# Patient Record
Sex: Female | Born: 1960 | Hispanic: No | Smoking: Former smoker
Health system: Southern US, Community
[De-identification: ages and names within clinical notes are randomized; demographics above are authoritative.]

## PROBLEM LIST (undated history)

## (undated) DIAGNOSIS — M51369 Other intervertebral disc degeneration, lumbar region without mention of lumbar back pain or lower extremity pain: Secondary | ICD-10-CM

## (undated) DIAGNOSIS — M779 Enthesopathy, unspecified: Secondary | ICD-10-CM

## (undated) DIAGNOSIS — N950 Postmenopausal bleeding: Principal | ICD-10-CM

## (undated) DIAGNOSIS — N852 Hypertrophy of uterus: Secondary | ICD-10-CM

## (undated) DIAGNOSIS — E7800 Pure hypercholesterolemia, unspecified: Secondary | ICD-10-CM

## (undated) DIAGNOSIS — G8929 Other chronic pain: Secondary | ICD-10-CM

## (undated) DIAGNOSIS — J45909 Unspecified asthma, uncomplicated: Secondary | ICD-10-CM

## (undated) DIAGNOSIS — M1990 Unspecified osteoarthritis, unspecified site: Secondary | ICD-10-CM

## (undated) DIAGNOSIS — T7840XA Allergy, unspecified, initial encounter: Secondary | ICD-10-CM

## (undated) DIAGNOSIS — D649 Anemia, unspecified: Secondary | ICD-10-CM

## (undated) DIAGNOSIS — I639 Cerebral infarction, unspecified: Secondary | ICD-10-CM

## (undated) DIAGNOSIS — R569 Unspecified convulsions: Secondary | ICD-10-CM

## (undated) DIAGNOSIS — M5136 Other intervertebral disc degeneration, lumbar region: Secondary | ICD-10-CM

## (undated) DIAGNOSIS — K5792 Diverticulitis of intestine, part unspecified, without perforation or abscess without bleeding: Secondary | ICD-10-CM

## (undated) DIAGNOSIS — M719 Bursopathy, unspecified: Secondary | ICD-10-CM

## (undated) DIAGNOSIS — E782 Mixed hyperlipidemia: Secondary | ICD-10-CM

## (undated) DIAGNOSIS — N3946 Mixed incontinence: Secondary | ICD-10-CM

## (undated) DIAGNOSIS — I10 Essential (primary) hypertension: Secondary | ICD-10-CM

## (undated) DIAGNOSIS — K219 Gastro-esophageal reflux disease without esophagitis: Secondary | ICD-10-CM

## (undated) DIAGNOSIS — M549 Dorsalgia, unspecified: Secondary | ICD-10-CM

## (undated) DIAGNOSIS — I219 Acute myocardial infarction, unspecified: Secondary | ICD-10-CM

## (undated) HISTORY — DX: Other intervertebral disc degeneration, lumbar region without mention of lumbar back pain or lower extremity pain: M51.369

## (undated) HISTORY — DX: Hypertrophy of uterus: N85.2

## (undated) HISTORY — DX: Unspecified convulsions: R56.9

## (undated) HISTORY — DX: Postmenopausal bleeding: N95.0

## (undated) HISTORY — DX: Enthesopathy, unspecified: M77.9

## (undated) HISTORY — DX: Allergy, unspecified, initial encounter: T78.40XA

## (undated) HISTORY — DX: Dorsalgia, unspecified: M54.9

## (undated) HISTORY — DX: Gastro-esophageal reflux disease without esophagitis: K21.9

## (undated) HISTORY — DX: Bursopathy, unspecified: M71.9

## (undated) HISTORY — PX: OTHER SURGICAL HISTORY: SHX169

## (undated) HISTORY — DX: Essential (primary) hypertension: I10

## (undated) HISTORY — DX: Other intervertebral disc degeneration, lumbar region: M51.36

## (undated) HISTORY — PX: TONSILLECTOMY: SUR1361

## (undated) HISTORY — DX: Other chronic pain: G89.29

## (undated) HISTORY — DX: Acute myocardial infarction, unspecified: I21.9

## (undated) HISTORY — DX: Mixed incontinence: N39.46

## (undated) HISTORY — DX: Mixed hyperlipidemia: E78.2

---

## 2012-02-14 ENCOUNTER — Emergency Department (HOSPITAL_COMMUNITY): Payer: Self-pay

## 2012-02-14 ENCOUNTER — Emergency Department (HOSPITAL_COMMUNITY)
Admission: EM | Admit: 2012-02-14 | Discharge: 2012-02-14 | Disposition: A | Discharge status: Home / Self Care | Payer: Self-pay | Attending: Emergency Medicine | Admitting: Emergency Medicine

## 2012-02-14 ENCOUNTER — Encounter (HOSPITAL_COMMUNITY): Payer: Self-pay | Admitting: *Deleted

## 2012-02-14 DIAGNOSIS — E7800 Pure hypercholesterolemia, unspecified: Secondary | ICD-10-CM | POA: Insufficient documentation

## 2012-02-14 DIAGNOSIS — Z87891 Personal history of nicotine dependence: Secondary | ICD-10-CM | POA: Insufficient documentation

## 2012-02-14 DIAGNOSIS — J45909 Unspecified asthma, uncomplicated: Secondary | ICD-10-CM | POA: Insufficient documentation

## 2012-02-14 DIAGNOSIS — R109 Unspecified abdominal pain: Secondary | ICD-10-CM | POA: Insufficient documentation

## 2012-02-14 DIAGNOSIS — D649 Anemia, unspecified: Secondary | ICD-10-CM | POA: Insufficient documentation

## 2012-02-14 DIAGNOSIS — Z8739 Personal history of other diseases of the musculoskeletal system and connective tissue: Secondary | ICD-10-CM | POA: Insufficient documentation

## 2012-02-14 DIAGNOSIS — R509 Fever, unspecified: Secondary | ICD-10-CM | POA: Insufficient documentation

## 2012-02-14 DIAGNOSIS — Z862 Personal history of diseases of the blood and blood-forming organs and certain disorders involving the immune mechanism: Secondary | ICD-10-CM | POA: Insufficient documentation

## 2012-02-14 HISTORY — DX: Diverticulitis of intestine, part unspecified, without perforation or abscess without bleeding: K57.92

## 2012-02-14 HISTORY — DX: Pure hypercholesterolemia, unspecified: E78.00

## 2012-02-14 HISTORY — DX: Anemia, unspecified: D64.9

## 2012-02-14 HISTORY — DX: Unspecified osteoarthritis, unspecified site: M19.90

## 2012-02-14 HISTORY — DX: Unspecified asthma, uncomplicated: J45.909

## 2012-02-14 LAB — URINALYSIS, ROUTINE W REFLEX MICROSCOPIC
Glucose, UA: NEGATIVE mg/dL
Ketones, ur: NEGATIVE mg/dL
Leukocytes, UA: NEGATIVE
Nitrite: NEGATIVE
Protein, ur: NEGATIVE mg/dL
Urobilinogen, UA: 0.2 mg/dL (ref 0.0–1.0)

## 2012-02-14 LAB — CBC WITH DIFFERENTIAL/PLATELET
Basophils Relative: 0 % (ref 0–1)
Eosinophils Absolute: 0.1 10*3/uL (ref 0.0–0.7)
HCT: 37.4 % (ref 36.0–46.0)
Hemoglobin: 13.2 g/dL (ref 12.0–15.0)
Lymphs Abs: 2.1 10*3/uL (ref 0.7–4.0)
MCH: 30.1 pg (ref 26.0–34.0)
MCHC: 35.3 g/dL (ref 30.0–36.0)
Monocytes Absolute: 0.4 10*3/uL (ref 0.1–1.0)
Monocytes Relative: 6 % (ref 3–12)
RBC: 4.39 MIL/uL (ref 3.87–5.11)

## 2012-02-14 LAB — COMPREHENSIVE METABOLIC PANEL
Albumin: 4 g/dL (ref 3.5–5.2)
Alkaline Phosphatase: 47 U/L (ref 39–117)
BUN: 11 mg/dL (ref 6–23)
Chloride: 103 mEq/L (ref 96–112)
Creatinine, Ser: 0.64 mg/dL (ref 0.50–1.10)
GFR calc Af Amer: >90 mL/min (ref >90)
Glucose, Bld: 92 mg/dL (ref 70–99)
Potassium: 3.4 mEq/L — ABNORMAL LOW (ref 3.5–5.1)
Total Bilirubin: 0.3 mg/dL (ref 0.3–1.2)
Total Protein: 7.7 g/dL (ref 6.0–8.3)

## 2012-02-14 LAB — PREGNANCY, URINE: Preg Test, Ur: NEGATIVE

## 2012-02-14 MED ORDER — HYDROMORPHONE HCL PF 1 MG/ML IJ SOLN
1.0000 mg | Freq: Once | INTRAMUSCULAR | Status: AC
  Administered 2012-02-14: 1 mg via INTRAVENOUS
  Filled 2012-02-14: qty 1

## 2012-02-14 MED ORDER — ONDANSETRON HCL 4 MG/2ML IJ SOLN
INTRAMUSCULAR | Status: AC
  Administered 2012-02-14: 4 mg via INTRAVENOUS
  Filled 2012-02-14: qty 2

## 2012-02-14 MED ORDER — IOHEXOL 300 MG/ML  SOLN
100.0000 mL | Freq: Once | INTRAMUSCULAR | Status: AC | PRN
  Administered 2012-02-14: 100 mL via INTRAVENOUS

## 2012-02-14 MED ORDER — ONDANSETRON HCL 4 MG/2ML IJ SOLN
4.0000 mg | Freq: Once | INTRAMUSCULAR | Status: AC
  Administered 2012-02-14: 4 mg via INTRAVENOUS
  Filled 2012-02-14: qty 2

## 2012-02-14 MED ORDER — ONDANSETRON HCL 4 MG/2ML IJ SOLN
4.0000 mg | Freq: Once | INTRAMUSCULAR | Status: AC
  Administered 2012-02-14: 4 mg via INTRAVENOUS

## 2012-02-14 MED ORDER — PROMETHAZINE HCL 25 MG PO TABS: 25.0000 mg | ORAL_TABLET | Freq: Four times a day (QID) | ORAL | Status: DC | PRN

## 2012-02-14 MED ORDER — TRAMADOL HCL 50 MG PO TABS: 50.0000 mg | ORAL_TABLET | Freq: Four times a day (QID) | ORAL | Status: DC | PRN

## 2012-02-14 NOTE — ED Notes (Signed)
Family at bedside. Patient does not need anything at this time. 

## 2012-02-14 NOTE — ED Provider Notes (Signed)
History   This chart was scribed for Benny Lennert, MD by Charolett Bumpers, ED Scribe. The patient was seen in room [facility]. Patient's care was started at 1433.   CSN: [id number]  Arrival date & time 02/14/12  1050   First MD Initiated Contact with Patient 02/14/12 1433      Chief Complaint  Patient presents with  . Diverticulitis   Lisa [name] is a 51 y.o. female who presents to the Emergency Department complaining of RLQ abdominal pain with associated fever and chills that started 3 days ago. She states that she has a h/o diverticulitis and today's pain feels similar. She states that her last flare up was in October. She states the only abdominal surgery she has had was a c-section. She states that her LNMP was last month.   Patient is a 51 y.o. female presenting with abdominal pain. The history is provided by the patient. No language interpreter was used.  Abdominal Pain The primary symptoms of the illness include abdominal pain and fever. The primary symptoms of the illness do not include fatigue, nausea, vomiting or diarrhea. The current episode started more than 2 days ago. The onset of the illness was gradual. The problem has been gradually worsening.  The abdominal pain is located in the RLQ and suprapubic region. The abdominal pain does not radiate.  Additional symptoms associated with the illness include chills. Symptoms associated with the illness do not include hematuria, frequency or back pain.    Past Medical History  Diagnosis Date  . Diverticulitis   . Arthritis   . Asthma   . High cholesterol   . Hypoglycemia   . Anemia     Past Surgical History  Procedure Date  . Cesarean section   . Tonsillectomy     No family history on file.  History  Substance Use Topics  . Smoking status: Former Games developer  . Smokeless tobacco: Not on file  . Alcohol Use: No    OB History    Grav Para Term Preterm Abortions TAB SAB Ect Mult Living                   Review of Systems  Constitutional: Positive for fever and chills. Negative for fatigue.  HENT: Negative for congestion, sinus pressure and ear discharge.   Eyes: Negative for discharge.  Respiratory: Negative for cough.   Cardiovascular: Negative for chest pain.  Gastrointestinal: Positive for abdominal pain. Negative for nausea, vomiting and diarrhea.  Genitourinary: Negative for frequency and hematuria.  Musculoskeletal: Negative for back pain.  Skin: Negative for rash.  Neurological: Negative for seizures and headaches.  Hematological: Negative.   Psychiatric/Behavioral: Negative for hallucinations.  All other systems reviewed and are negative.    Allergies  Naproxen and Penicillins  Home Medications  No current outpatient prescriptions on file.  BP 137/102  Pulse 60  Temp 97.7 F (36.5 C) (Oral)  Resp 20  Ht 5\' 3"  (1.6 m)  Wt 190 lb 2 oz (86.24 kg)  BMI 33.68 kg/m2  SpO2 96%  LMP 12/26/2011  Physical Exam  Nursing note and vitals reviewed. Constitutional: She is oriented to person, place, and time. She appears well-developed.  HENT:  Head: Normocephalic and atraumatic.  Eyes: Conjunctivae normal and EOM are normal. No scleral icterus.  Neck: Neck supple. No thyromegaly present.  Cardiovascular: Normal rate, regular rhythm and normal heart sounds.  Exam reveals no gallop and no friction rub.   No murmur heard. Pulmonary/Chest: Effort normal  and breath sounds normal. No stridor. No respiratory distress. She has no wheezes. She has no rales. She exhibits no tenderness.  Abdominal: Soft. Bowel sounds are normal. She exhibits no distension. There is tenderness. There is no rebound and no guarding.       Moderate RLQ abdominal tenderness with some over suprapubic region.   Musculoskeletal: Normal range of motion. She exhibits no edema.  Lymphadenopathy:    She has no cervical adenopathy.  Neurological: She is oriented to person, place, and time. Coordination  normal.  Skin: No rash noted. No erythema.  Psychiatric: She has a normal mood and affect. Her behavior is normal.    ED Course  Procedures (including critical care time)  DIAGNOSTIC STUDIES: Oxygen Saturation is 96% on room air, adequate by my interpretation.    COORDINATION OF CARE:  14:40-Discussed planned course of treatment with the patient including pain and nausea medication, blood work and UA, who is agreeable at this time.   14:45-Medication Orders: Hydromorphone (Dilaudid) injection 1 mg-once; Ondansetron (Zofran) injection 4 mg-once.   Labs Reviewed - No data to display No results found.   No diagnosis found.    MDM  The chart was scribed for me under my direct supervision.  I personally performed the history, physical, and medical decision making and all procedures in the evaluation of this patient.Benny Lennert, MD 02/14/12 [phone]

## 2012-02-14 NOTE — ED Notes (Signed)
Hx of diverticulitis. C/o lower abd pain.  Denies n/v/d.

## 2012-07-24 ENCOUNTER — Other Ambulatory Visit (HOSPITAL_COMMUNITY): Payer: Self-pay | Admitting: Physician Assistant

## 2012-07-24 DIAGNOSIS — Z139 Encounter for screening, unspecified: Secondary | ICD-10-CM

## 2012-08-01 ENCOUNTER — Ambulatory Visit (HOSPITAL_COMMUNITY): Payer: Self-pay

## 2012-08-16 ENCOUNTER — Ambulatory Visit (HOSPITAL_COMMUNITY): Payer: Self-pay

## 2012-09-16 ENCOUNTER — Ambulatory Visit (HOSPITAL_COMMUNITY)
Admission: RE | Admit: 2012-09-16 | Discharge: 2012-09-16 | Disposition: A | Discharge status: Home / Self Care | Payer: Self-pay | Source: Ambulatory Visit | Attending: Physician Assistant | Admitting: Physician Assistant

## 2012-09-16 DIAGNOSIS — Z139 Encounter for screening, unspecified: Secondary | ICD-10-CM

## 2012-12-10 ENCOUNTER — Ambulatory Visit (INDEPENDENT_AMBULATORY_CARE_PROVIDER_SITE_OTHER): Payer: Self-pay | Admitting: Cardiology

## 2012-12-10 ENCOUNTER — Emergency Department (HOSPITAL_COMMUNITY): Payer: Self-pay

## 2012-12-10 ENCOUNTER — Encounter: Payer: Self-pay | Admitting: Cardiology

## 2012-12-10 ENCOUNTER — Encounter: Payer: Self-pay | Admitting: *Deleted

## 2012-12-10 ENCOUNTER — Encounter (HOSPITAL_COMMUNITY): Payer: Self-pay | Admitting: *Deleted

## 2012-12-10 ENCOUNTER — Ambulatory Visit: Payer: Self-pay | Admitting: Cardiology

## 2012-12-10 ENCOUNTER — Emergency Department (HOSPITAL_COMMUNITY)
Admission: EM | Admit: 2012-12-10 | Discharge: 2012-12-10 | Disposition: A | Discharge status: Home / Self Care | Payer: Self-pay | Attending: Emergency Medicine | Admitting: Emergency Medicine

## 2012-12-10 VITALS — BP 152/83 | HR 63 | Ht 62.0 in | Wt 189.1 lb

## 2012-12-10 DIAGNOSIS — I10 Essential (primary) hypertension: Secondary | ICD-10-CM

## 2012-12-10 DIAGNOSIS — M25521 Pain in right elbow: Secondary | ICD-10-CM

## 2012-12-10 DIAGNOSIS — Y9301 Activity, walking, marching and hiking: Secondary | ICD-10-CM | POA: Insufficient documentation

## 2012-12-10 DIAGNOSIS — G8929 Other chronic pain: Secondary | ICD-10-CM | POA: Insufficient documentation

## 2012-12-10 DIAGNOSIS — S59909A Unspecified injury of unspecified elbow, initial encounter: Secondary | ICD-10-CM | POA: Insufficient documentation

## 2012-12-10 DIAGNOSIS — Z8719 Personal history of other diseases of the digestive system: Secondary | ICD-10-CM | POA: Insufficient documentation

## 2012-12-10 DIAGNOSIS — E782 Mixed hyperlipidemia: Secondary | ICD-10-CM

## 2012-12-10 DIAGNOSIS — R072 Precordial pain: Secondary | ICD-10-CM

## 2012-12-10 DIAGNOSIS — I252 Old myocardial infarction: Secondary | ICD-10-CM | POA: Insufficient documentation

## 2012-12-10 DIAGNOSIS — S8391XA Sprain of unspecified site of right knee, initial encounter: Secondary | ICD-10-CM

## 2012-12-10 DIAGNOSIS — Y9269 Other specified industrial and construction area as the place of occurrence of the external cause: Secondary | ICD-10-CM | POA: Insufficient documentation

## 2012-12-10 DIAGNOSIS — M129 Arthropathy, unspecified: Secondary | ICD-10-CM | POA: Insufficient documentation

## 2012-12-10 DIAGNOSIS — Z87891 Personal history of nicotine dependence: Secondary | ICD-10-CM | POA: Insufficient documentation

## 2012-12-10 DIAGNOSIS — J45909 Unspecified asthma, uncomplicated: Secondary | ICD-10-CM | POA: Insufficient documentation

## 2012-12-10 DIAGNOSIS — R296 Repeated falls: Secondary | ICD-10-CM | POA: Insufficient documentation

## 2012-12-10 DIAGNOSIS — W19XXXA Unspecified fall, initial encounter: Secondary | ICD-10-CM

## 2012-12-10 DIAGNOSIS — Z79899 Other long term (current) drug therapy: Secondary | ICD-10-CM | POA: Insufficient documentation

## 2012-12-10 DIAGNOSIS — IMO0002 Reserved for concepts with insufficient information to code with codable children: Secondary | ICD-10-CM | POA: Insufficient documentation

## 2012-12-10 DIAGNOSIS — Z7982 Long term (current) use of aspirin: Secondary | ICD-10-CM | POA: Insufficient documentation

## 2012-12-10 DIAGNOSIS — S6990XA Unspecified injury of unspecified wrist, hand and finger(s), initial encounter: Secondary | ICD-10-CM | POA: Insufficient documentation

## 2012-12-10 NOTE — Assessment & Plan Note (Signed)
Patient states that she has recently been placed on HCTZ. [facility]. May need additional adjustments.

## 2012-12-10 NOTE — Assessment & Plan Note (Signed)
Symptoms as outlined above, possibly anginal based on description. Patient reports a vague history of heart attack back in the 1980s, details are not clear, she also denies any specific ischemic testing over the years. ECG reviewed and overall nonspecific with questionable inferior Q waves. She is not certain about her family history. Does have a prior history of tobacco use, known hypertension and hyperlipidemia. Reports being on a stable medical regimen recently. We discussed options for further objective cardiac evaluation including both noninvasive and invasive techniques. Given her functional limitations related to chronic pain and difficulty ambulating, we will obtain a dobutamine Myoview. Echocardiogram also be obtained to assess cardiac structure and function. We will inform her of the results and determine if any further evaluation is needed at this time.

## 2012-12-10 NOTE — Assessment & Plan Note (Signed)
Patient now on Zocor, LDL is under 100.

## 2012-12-10 NOTE — ED Provider Notes (Signed)
CSN: [id number]     Arrival date & time 12/10/12  1100 History   This chart was scribed for Lisa Gaskins, MD, by Yevette Edwards, ED Scribe. This patient was seen in room [facility] and the patient's care was started at 1:49 PM. First MD Initiated Contact with Patient 12/10/12 1344     Chief Complaint  Patient presents with  . Knee Pain  . Arm Pain  . Fall    Patient is a 52 y.o. female presenting with knee pain, arm pain, and fall. The history is provided by the patient. No language interpreter was used.  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    Entrapped after fall: no   Knee location:  R knee Pain details:    Radiates to:  Does not radiate Arm Pain Pertinent negatives include no abdominal pain.  Fall Pertinent negatives include no abdominal pain.   HPI Comments: Lisa [name] is a 52 y.o. female, with a h/o arthritis, who presents to the Emergency Department complaining of a fall which occurred outside her cardiologist's office this afternoon while using her cellphone; she landed upon her right knee and right elbow in the fall. She denies any head trauma or LOC. The pt reports pain to her right arm and her right leg; she rates the pain as 5/10. She denies any chest or abdominal pain. The pt has a h/o chronic back pain and tendinitis. She takes low-dosage aspirin.  .   Past Medical History  Diagnosis Date  . Diverticulitis   . Arthritis   . Asthma   . Mixed hyperlipidemia   . Essential hypertension, benign   . Tendinitis     Patient reports chronic problem involving her arms and hands  . Chronic back pain     Patient reports lower back disc problems  . Myocardial infarction     Patient reports related to "steroids" in 1985 [facility])   Past Surgical History  Procedure Laterality Date  . Cesarean section    . Tonsillectomy     No  family history on file. History  Substance Use Topics  . Smoking status: Former Smoker    Types: Cigarettes  . Smokeless tobacco: Not on file  . Alcohol Use: No   No OB history provided.  Review of Systems  Gastrointestinal: Negative for abdominal pain.  Musculoskeletal: Positive for myalgias and arthralgias.  Neurological: Negative for syncope.  All other systems reviewed and are negative.    Allergies  Morphine and related; Naproxen; Ibuprofen; and Penicillins  Home Medications   Current Outpatient Rx  Name  Route  Sig  Dispense  Refill  . acetaminophen (TYLENOL) 500 MG tablet   Oral   Take 1,000 mg by mouth every 6 (six) hours as needed. Pain         . albuterol (PROVENTIL HFA;VENTOLIN HFA) 108 (90 BASE) MCG/ACT inhaler   Inhalation   Inhale 2 puffs into the lungs every 6 (six) hours as needed for wheezing.         Marland Kitchen aspirin EC 81 MG tablet   Oral   Take 81 mg by mouth daily.         . hydrochlorothiazide (HYDRODIURIL) 25 MG tablet   Oral   Take 25 mg by mouth daily.         Marland Kitchen  magnesium oxide (MAG-OX) 400 MG tablet   Oral   Take 400 mg by mouth daily.         . Melatonin-Pyridoxine (MELATIN PO)   Oral   Take 3 mg by mouth daily.          . promethazine (PHENERGAN) 25 MG tablet   Oral   Take 1 tablet (25 mg total) by mouth every 6 (six) hours as needed for nausea.   15 tablet   0   . simvastatin (ZOCOR) 20 MG tablet   Oral   Take 20 mg by mouth at bedtime.         Marland Kitchen VITAMIN E PO   Oral   Take 1 tablet by mouth daily.         . Cholecalciferol (VITAMIN D PO)   Oral   Take 2,000 Units by mouth daily.          . fish oil-omega-3 fatty acids 1000 MG capsule   Oral   Take 2 g by mouth daily.         . traMADol (ULTRAM) 50 MG tablet   Oral   Take 25 mg by mouth every 6 (six) hours as needed for pain.          Triage Vitals: BP 149/76  Pulse 60  Temp(Src) 97.7 F (36.5 C) (Oral)  Resp 18  Ht 5\' 2"  (1.575 m)  Wt 189 lb  (85.73 kg)  BMI 34.56 kg/m2  SpO2 98%  LMP 12/26/2011  . Physical Exam  Nursing note and vitals reviewed. Constitutional: She is oriented to person, place, and time. She appears well-developed and well-nourished. No distress.  HENT:  Head: Normocephalic and atraumatic.  Eyes: EOM are normal.  Neck: Neck supple. No tracheal deviation present.  Cardiovascular: Normal rate.   Pulmonary/Chest: Effort normal. No respiratory distress.  Musculoskeletal: Normal range of motion.  Mild tenderness to right elbow. Mild tenderness to right patella. Full ROM for both hips without difficulty. No other bony tenderness or deformity noted.   No CTL tenderness.   Neurological: She is alert and oriented to person, place, and time.  Pt ambulatory without difficulty.   Skin: Skin is warm and dry.  Psychiatric: She has a normal mood and affect. Her behavior is normal.    ED Course  Procedures (including critical care time)  DIAGNOSTIC STUDIES: Oxygen Saturation is 96% on room air, normal by my interpretation.    COORDINATION OF CARE:  1:54 PM- Discussed treatment plan with patient, and the patient agreed to the plan.   Labs Review Labs Reviewed - No data to display Imaging Review Dg Elbow Complete Right  12/10/2012   CLINICAL DATA:  Pain post trauma  EXAM: RIGHT ELBOW - COMPLETE 3+ VIEW  COMPARISON:  None.  FINDINGS: Frontal, lateral, and bilateral oblique views were obtained. There is no fracture, dislocation, or effusion. Joint spaces appear intact. No erosive change.  IMPRESSION: No abnormality noted.   Electronically Signed   By: Bretta Bang   On: 12/10/2012 13:07   Dg Knee Complete 4 Views Right  12/10/2012   CLINICAL DATA:  Pain post trauma  EXAM: RIGHT KNEE - COMPLETE 4+ VIEW  COMPARISON:  None.  FINDINGS: Frontal, lateral, and bilateral oblique views were obtained. There is no fracture, dislocation, or effusion. Joint spaces appear intact. No erosive change.  IMPRESSION: No  abnormality noted.   Electronically Signed   By: Bretta Bang   On: 12/10/2012 13:07    Pt  ambulatory without difficulty, and she is requesting d/c home.  She does not want pain meds   MDM  No diagnosis found.   Nursing notes including past medical history and social history reviewed and considered in documentation xrays reviewed and considered    I personally performed the services described in this documentation, which was scribed in my presence. The recorded information has been reviewed and is accurate.       Lisa Gaskins, MD 12/10/12 2218

## 2012-12-10 NOTE — ED Notes (Signed)
Pt presents with RT arm and knee pain after a fall sustained a few minutes ago. Pulses present in all extremities.

## 2012-12-10 NOTE — ED Notes (Signed)
Pt returned from radiology, states " I think I dislocated my back, my knee and arm feels sore but better".  Bilateral pedal pulses present and strong. Denies bowel and bladder dysfunction. Pt ambulated from wheelchair to stretcher slow but no difficulty noted. Gait steady.

## 2012-12-10 NOTE — Progress Notes (Signed)
Clinical Summary Lisa [name] is a 52 y.o.female referred for cardiology consultation by Ms. McElroy PA-C at the [facility]. The records regarding her prior health history are not available at this time, and she is not certain of her family history. She has been living in [place] since last October, apparently prior to this was in [place] and [place]. She states that she had a "heart attack" back in 1985 after being treated with "steroids." She denies having any specific ischemic testing over the years, not certain about her followup for this reported problem. 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  She comes in today with her significant other, reporting a six-month history of intermittent chest "tightness" typically when she is up on her feet for "too long" which is usually about 20 or 30 minutes. She reports a history of similar chest pain for years, typically in the setting of emotional stress. At baseline she has NYHA class II dyspnea. She states that she is functionally limited due to chronic back pain, uses a walker when she has to go for any distance, also is on chronic pain medications.  Recent lab work in August showed potassium 4.9, BUN 11, creatinine 0.7, AST 44, ALT 31, cholesterol 160, triglycerides 101, HDL 46, LDL 94. Faxed copy of ECGs showed questionable inferior Q waves.  Today we discussed her known cardiac risk factor profile, her symptomatology, and possibility for initial cardiovascular testing. We discussed noninvasive and invasive techniques. She was very concerned about her possibility of being able to ambulate on a treadmill for any distance and we therefore discussed pharmacologic stress testing as well.   Allergies  Allergen Reactions  . Morphine And Related Other (See Comments)    welps  . Naproxen Other (See Comments)   Lightheadedness  . Ibuprofen Rash  . Penicillins Rash    Current Outpatient Prescriptions  Medication Sig Dispense Refill  . acetaminophen (TYLENOL) 500 MG tablet Take 1,000 mg by mouth every 6 (six) hours as needed. Pain      . albuterol (PROVENTIL HFA;VENTOLIN HFA) 108 (90 BASE) MCG/ACT inhaler Inhale 2 puffs into the lungs every 6 (six) hours as needed for wheezing.      Marland Kitchen aspirin 81 MG tablet Take 81 mg by mouth daily.      . Cholecalciferol (VITAMIN D PO) Take 2,000 Units by mouth daily.       . fish oil-omega-3 fatty acids 1000 MG capsule Take 2 g by mouth daily.      . hydrochlorothiazide (HYDRODIURIL) 25 MG tablet Take 25 mg by mouth daily.      . Melatonin-Pyridoxine (MELATIN PO) Take 3 mg by mouth daily.       . promethazine (PHENERGAN) 25 MG tablet Take 1 tablet (25 mg total) by mouth every 6 (six) hours as needed for nausea.  15 tablet  0  . simvastatin (ZOCOR) 20 MG tablet Take 20 mg by mouth at bedtime.      . traMADol (ULTRAM) 50 MG tablet Take 25 mg by mouth every 6 (six) hours as needed for pain.      Marland Kitchen VITAMIN E PO Take 1 tablet by mouth daily.      . magnesium oxide (MAG-OX) 400 MG tablet Take 400 mg by mouth daily.       No current facility-administered medications for this visit.  Past Medical History  Diagnosis Date  . Diverticulitis   . Arthritis   . Asthma   . Mixed hyperlipidemia   . Essential hypertension, benign   . Tendinitis     Patient reports chronic problem involving her arms and hands  . Chronic back pain     Patient reports lower back disc problems  . Myocardial infarction     Patient reports related to "steroids" in 1985 [facility])    Past Surgical History  Procedure Laterality Date  . Cesarean section    . Tonsillectomy      Family History  Problem Relation Age of Onset  . Family history unknown: Yes    Social History Ms. Rothe reports that she has quit smoking. Her smoking use included Cigarettes. She smoked 0.00 packs per day. She  does not have any smokeless tobacco history on file. Ms. Kafer reports that she does not drink alcohol.  Review of Systems No palpitations or syncope. Reports chronic problems due to arm tendinitis, lower back pain. Stable appetite. Otherwise as outlined above.  Physical Examination Filed Vitals:   12/10/12 0936  BP: 152/83  Pulse: 63   Filed Weights   12/10/12 0936  Weight: 189 lb 1.9 oz (85.784 kg)   Appears somewhat older than stated age. No acute distress. HEENT: Conjunctiva and lids normal, oropharynx clear. Neck: Supple, no elevated JVP or carotid bruits, no thyromegaly. Lungs: Clear to auscultation, nonlabored breathing at rest. Cardiac: Regular rate and rhythm, no S3 or significant systolic murmur, no pericardial rub. Abdomen: Soft, nontender, bowel sounds present, no guarding or rebound. Extremities: Trace ankle edema, distal pulses 1-2+. Skin: Warm and dry. Musculoskeletal: No kyphosis. Neuropsychiatric: Alert and oriented x3, somewhat tangential historian.   Problem List and Plan   Precordial pain Symptoms as outlined above, possibly anginal based on description. Patient reports a vague history of heart attack back in the 1980s, details are not clear, she also denies any specific ischemic testing over the years. ECG reviewed and overall nonspecific with questionable inferior Q waves. She is not certain about her family history. Does have a prior history of tobacco use, known hypertension and hyperlipidemia. Reports being on a stable medical regimen recently. We discussed options for further objective cardiac evaluation including both noninvasive and invasive techniques. Given her functional limitations related to chronic pain and difficulty ambulating, we will obtain a dobutamine Myoview. Echocardiogram also be obtained to assess cardiac structure and function. We will inform her of the results and determine if any further evaluation is needed at this time.  Essential  hypertension, benign Patient states that she has recently been placed on HCTZ. [facility]. May need additional adjustments.  Mixed hyperlipidemia Patient now on Zocor, LDL is under 100.    Jonelle Sidle, M.D., F.A.C.C.

## 2012-12-10 NOTE — Patient Instructions (Addendum)
Your physician recommends that you schedule a follow-up appointment in: TO BE DETERMINED  Your physician has requested that you have an echocardiogram. Echocardiography is a painless test that uses sound waves to create images of your heart. It provides your doctor with information about the size and shape of your heart and how well your heart's chambers and valves are working. This procedure takes approximately one hour. There are no restrictions for this procedure.  Your physician has requested that you have a dobutamine myoview. For furth information please visit [email or web address] Please follow instruction sheet, as given.  WE WILL CALL YOU WITH YOUR TEST RESULTS/INSTRUCTIONS/NEXT STEPS ONCE RECEIVED BY THE PROVIDER

## 2012-12-17 ENCOUNTER — Encounter: Payer: Self-pay | Admitting: Cardiology

## 2012-12-20 ENCOUNTER — Encounter (HOSPITAL_COMMUNITY): Payer: Self-pay

## 2012-12-20 ENCOUNTER — Ambulatory Visit (HOSPITAL_COMMUNITY)
Admission: RE | Admit: 2012-12-20 | Discharge: 2012-12-20 | Disposition: A | Discharge status: Home / Self Care | Payer: Self-pay | Source: Ambulatory Visit | Attending: Cardiology | Admitting: Cardiology

## 2012-12-20 ENCOUNTER — Encounter (HOSPITAL_COMMUNITY)
Admission: RE | Admit: 2012-12-20 | Discharge: 2012-12-20 | Disposition: A | Discharge status: Home / Self Care | Payer: Self-pay | Source: Ambulatory Visit | Attending: Cardiology | Admitting: Cardiology

## 2012-12-20 DIAGNOSIS — R079 Chest pain, unspecified: Secondary | ICD-10-CM

## 2012-12-20 DIAGNOSIS — I517 Cardiomegaly: Secondary | ICD-10-CM

## 2012-12-20 DIAGNOSIS — I10 Essential (primary) hypertension: Secondary | ICD-10-CM | POA: Insufficient documentation

## 2012-12-20 DIAGNOSIS — R072 Precordial pain: Secondary | ICD-10-CM

## 2012-12-20 DIAGNOSIS — Z87891 Personal history of nicotine dependence: Secondary | ICD-10-CM | POA: Insufficient documentation

## 2012-12-20 MED ORDER — DOBUTAMINE IN D5W 4-5 MG/ML-% IV SOLN
INTRAVENOUS | Status: AC
  Administered 2012-12-20: 10 ug/kg/min via INTRAVENOUS
  Filled 2012-12-20: qty 250

## 2012-12-20 MED ORDER — ATROPINE SULFATE 1 MG/ML IJ SOLN
INTRAMUSCULAR | Status: AC
  Administered 2012-12-20: 0.5 mg via INTRAVENOUS
  Filled 2012-12-20: qty 1

## 2012-12-20 MED ORDER — TECHNETIUM TC 99M SESTAMIBI - CARDIOLITE
30.0000 | Freq: Once | INTRAVENOUS | Status: AC | PRN
  Administered 2012-12-20: 12:00:00 30 via INTRAVENOUS

## 2012-12-20 MED ORDER — TECHNETIUM TC 99M SESTAMIBI - CARDIOLITE
10.0000 | Freq: Once | INTRAVENOUS | Status: AC | PRN
  Administered 2012-12-20: 10 via INTRAVENOUS

## 2012-12-20 MED ORDER — NITROGLYCERIN 0.4 MG SL SUBL
SUBLINGUAL_TABLET | SUBLINGUAL | Status: AC
  Administered 2012-12-20: 0.4 mg via SUBLINGUAL
  Filled 2012-12-20: qty 25

## 2012-12-20 NOTE — Progress Notes (Signed)
Stress Lab Nurses Notes - Lisa [name] 12/20/2012 Reason for doing test: CAD and Chest Pain Type of test: Dobutamine Cardiolite  Nurse performing test: Parke Poisson, RN Nuclear Medicine Tech: Lyndel Pleasure Echo Tech: Not Applicable MD performing test: Dr. Wyline Mood / Joni Reining NP Family MD: [facility] Test explained and consent signed: yes IV started: 22g jelco, NS 250 cc, KVO, No redness or edema and Saline lock started in radiology Symptoms: Chest pain ,SOB & Vomiting Treatment/Intervention: Atropine 0.5 mg IV and NTG 0.4SL x 1 Reason test stopped: reached target HR After recovery IV was: Discontinued via X-ray tech and No redness or edema Patient to return to Nuc. Med at : 12:45 Patient discharged: Home Patient's Condition upon discharge was: stable Comments: During test peak BP 166/81 & HR 160.  Recovery BP 131/87 & HR 82.  Symptoms resolved in recovery. [name] T

## 2012-12-20 NOTE — Progress Notes (Signed)
[facility]   2D echo completed 12/20/2012.   Cindy Staysha Truby, RDCS  

## 2012-12-23 ENCOUNTER — Other Ambulatory Visit (HOSPITAL_COMMUNITY): Payer: Self-pay

## 2012-12-23 DIAGNOSIS — R0602 Shortness of breath: Secondary | ICD-10-CM

## 2012-12-26 ENCOUNTER — Encounter (HOSPITAL_COMMUNITY): Payer: Self-pay

## 2013-01-13 ENCOUNTER — Ambulatory Visit (HOSPITAL_COMMUNITY)
Admission: RE | Admit: 2013-01-13 | Discharge: 2013-01-13 | Disposition: A | Discharge status: Home / Self Care | Payer: Self-pay | Source: Ambulatory Visit | Attending: Physician Assistant | Admitting: Physician Assistant

## 2013-01-13 DIAGNOSIS — R0602 Shortness of breath: Secondary | ICD-10-CM | POA: Insufficient documentation

## 2013-01-13 MED ORDER — ALBUTEROL SULFATE (5 MG/ML) 0.5% IN NEBU
2.5000 mg | INHALATION_SOLUTION | Freq: Once | RESPIRATORY_TRACT | Status: AC
  Administered 2013-01-13: 2.5 mg via RESPIRATORY_TRACT

## 2013-01-15 NOTE — Procedures (Signed)
[name], [name]                ACCOUNT NO.:  [id number]  MEDICAL RECORD NO.:  [id number]  LOCATION:  RESP                          FACILITY:  [facility]  PHYSICIAN:  Yarenis Cerino L. Juanetta Gosling, M.D.DATE OF BIRTH:  Dec 23, 1960  DATE OF PROCEDURE: DATE OF DISCHARGE:  01/13/2013                           PULMONARY FUNCTION TEST   REASON FOR PULMONARY FUNCTION TESTING:  Shortness of breath.  1. Spirometry shows a mild ventilatory defect without evidence of     airflow obstruction. 2. Lung volumes shows significant reduction in total lung capacity     suggesting a primary restrictive lung disease.  Clinical     correlation is suggested. 3. DLCO is mildly reduced. 4. Airway resistance is normal. 5. There was no significant bronchodilator improvement. 6. This patient may have a primary restrictive lung disease and should     be evaluated for that.     Ada Holness L. Juanetta Gosling, M.D.     ELH/MEDQ  D:  01/14/2013  T:  01/15/2013  Job:  [id number]

## 2013-01-21 ENCOUNTER — Ambulatory Visit (INDEPENDENT_AMBULATORY_CARE_PROVIDER_SITE_OTHER): Payer: Self-pay | Admitting: Cardiology

## 2013-01-21 ENCOUNTER — Encounter: Payer: Self-pay | Admitting: Cardiology

## 2013-01-21 ENCOUNTER — Ambulatory Visit: Payer: Self-pay | Admitting: Cardiology

## 2013-01-21 VITALS — BP 164/92 | HR 63 | Ht 62.0 in | Wt 186.8 lb

## 2013-01-21 DIAGNOSIS — E782 Mixed hyperlipidemia: Secondary | ICD-10-CM

## 2013-01-21 DIAGNOSIS — I10 Essential (primary) hypertension: Secondary | ICD-10-CM

## 2013-01-21 DIAGNOSIS — R9430 Abnormal result of cardiovascular function study, unspecified: Secondary | ICD-10-CM

## 2013-01-21 LAB — PULMONARY FUNCTION TEST

## 2013-01-21 NOTE — Progress Notes (Signed)
Clinical Summary Ms. Tanksley is a 52 y.o.female presenting for office followup, last seen in September. She is here with her significant other. She reports no escalation in shortness of breath or intermittent chest pain symptoms.  Dobutamine Cardiolite in September reported no diagnostic ST segment changes, small area of ischemia in the RCA distribution, LVEF 71%, overall low risk. Echocardiogram at that time demonstrated mild LVH with LVEF 65-70%, grade 1 diastolic dysfunction, trivial tricuspid regurgitation, unable to calculate PASP, CVP 8 mm mercury, no pericardial effusion.  Today we reviewed the results of her stress testing, discussed the implications. This test would suggest some degree of residual atherosclerosis in the RCA distribution, she reports a history of prior myocardial infarction, although details are not available. Overall low risk study in general. We discussed strategy of medical therapy, risk factor modification, and observation, realizing that we could pursue further invasive evaluation if symptoms progressed. She voiced comfort with this.   Allergies  Allergen Reactions  . Morphine And Related Other (See Comments)    welps  . Naproxen Other (See Comments)    Lightheadedness  . Ibuprofen Rash  . Penicillins Rash    Current Outpatient Prescriptions  Medication Sig Dispense Refill  . acetaminophen (TYLENOL) 500 MG tablet Take 1,000 mg by mouth every 6 (six) hours as needed. Pain      . albuterol (PROVENTIL HFA;VENTOLIN HFA) 108 (90 BASE) MCG/ACT inhaler Inhale 2 puffs into the lungs every 6 (six) hours as needed for wheezing.      Marland Kitchen aspirin EC 81 MG tablet Take 81 mg by mouth daily.      . Cholecalciferol (VITAMIN D PO) Take 2,000 Units by mouth daily.       . fish oil-omega-3 fatty acids 1000 MG capsule Take 2 g by mouth daily.      . hydrochlorothiazide (HYDRODIURIL) 25 MG tablet Take 25 mg by mouth daily.      . magnesium oxide (MAG-OX) 400 MG tablet Take 400 mg  by mouth daily.      . Melatonin-Pyridoxine (MELATIN PO) Take 3 mg by mouth daily.       . simvastatin (ZOCOR) 20 MG tablet Take 20 mg by mouth at bedtime.      Marland Kitchen VITAMIN E PO Take 1 tablet by mouth daily.       No current facility-administered medications for this visit.    Past Medical History  Diagnosis Date  . Diverticulitis   . Arthritis   . Asthma   . Mixed hyperlipidemia   . Essential hypertension, benign   . Tendinitis     Patient reports chronic problem involving her arms and hands  . Chronic back pain     Patient reports lower back disc problems  . Myocardial infarction     Patient reports related to "steroids" in 33 ([place])    Social History Ms. Carberry reports that she has quit smoking. Her smoking use included Cigarettes. She smoked 0.00 packs per day. She does not have any smokeless tobacco history on file. Ms. Defrain reports that she does not drink alcohol.  Review of Systems No palpitations or syncope. Trouble with her "asthma."  Stable appetite. Otherwise negative.  Physical Examination Filed Vitals:   01/21/13 1256  BP: 164/92  Pulse: 63   Filed Weights   01/21/13 1256  Weight: 186 lb 12 oz (84.709 kg)    Appears comfortable at rest.  HEENT: Conjunctiva and lids normal, oropharynx clear.  Neck: Supple, no elevated JVP or  carotid bruits, no thyromegaly.  Lungs: Clear to auscultation, nonlabored breathing at rest.  Cardiac: Regular rate and rhythm, no S3 or significant systolic murmur, no pericardial rub.  Abdomen: Soft, nontender, bowel sounds present, no guarding or rebound.  Extremities: Trace ankle edema, distal pulses 1-2+.    Problem List and Plan   Abnormal cardiovascular function study Outlined above, overall low risk. I discussed implications with the patient today. She reports a prior history of myocardial infarction, details not available. ECG with questionable inferior Q waves, no wall motion abnormalities by echocardiogram however. She  has normal LVEF, small area of possible RCA distribution ischemia. Would recommend medical therapy and observation for now, unless her symptoms progress. She is on aspirin, statin, omega-3 supplements, HCTZ. Keep followup with primary care provider. See her back in 6 months, sooner if needed.  Mixed hyperlipidemia On Zocor.  Essential hypertension, benign Blood pressure elevated today. Patient states that she had an argument with her significant other on the way to the office today. I asked her to keep an eye on blood pressure with her primary care provider.    Jonelle Sidle, M.D., F.A.C.C.

## 2013-01-21 NOTE — Assessment & Plan Note (Signed)
On Zocor 

## 2013-01-21 NOTE — Assessment & Plan Note (Signed)
Blood pressure elevated today. Patient states that she had an argument with her significant other on the way to the office today. I asked her to keep an eye on blood pressure with her primary care provider.

## 2013-01-21 NOTE — Patient Instructions (Addendum)
Your physician recommends that you schedule a follow-up appointment in: 6 months   Your physician recommends that you continue on your current medications as directed. Please refer to the Current Medication list given to you today. FOLLOW UP WITH YOUR PRIMARY CARE PHYSICIAN IN THE MEANTIME

## 2013-01-21 NOTE — Assessment & Plan Note (Signed)
Outlined above, overall low risk. I discussed implications with the patient today. She reports a prior history of myocardial infarction, details not available. ECG with questionable inferior Q waves, no wall motion abnormalities by echocardiogram however. She has normal LVEF, small area of possible RCA distribution ischemia. Would recommend medical therapy and observation for now, unless her symptoms progress. She is on aspirin, statin, omega-3 supplements, HCTZ. Keep followup with primary care provider. See her back in 6 months, sooner if needed.

## 2013-03-31 ENCOUNTER — Encounter: Payer: Self-pay | Admitting: Cardiology

## 2013-04-15 ENCOUNTER — Institutional Professional Consult (permissible substitution): Payer: Self-pay | Admitting: Emergency Medicine

## 2013-04-18 ENCOUNTER — Ambulatory Visit (INDEPENDENT_AMBULATORY_CARE_PROVIDER_SITE_OTHER): Payer: Self-pay | Admitting: Emergency Medicine

## 2013-04-18 ENCOUNTER — Encounter (INDEPENDENT_AMBULATORY_CARE_PROVIDER_SITE_OTHER): Payer: Self-pay

## 2013-04-18 ENCOUNTER — Encounter: Payer: Self-pay | Admitting: Emergency Medicine

## 2013-04-18 VITALS — BP 128/86 | HR 78 | Ht 62.0 in | Wt 191.0 lb

## 2013-04-18 DIAGNOSIS — R0609 Other forms of dyspnea: Secondary | ICD-10-CM

## 2013-04-18 DIAGNOSIS — R0989 Other specified symptoms and signs involving the circulatory and respiratory systems: Secondary | ICD-10-CM

## 2013-04-18 DIAGNOSIS — R0600 Dyspnea, unspecified: Secondary | ICD-10-CM | POA: Insufficient documentation

## 2013-04-18 NOTE — Progress Notes (Signed)
Subjective:    Patient ID: Lisa [name], female    DOB: [birth year], 53 y.o.   MRN: [id number]  HPI 53 yo former smoker, hx CAD and prior MI, HTN, carries a hx of asthma made in '94. She did have PFT at that time in [place]. [facility] [place] for chest discomfort, cough and dyspnea. Has been evaluated by Dr Diona BrownerMcDowell with TTE and stress testing 10/'14. [facility] 11/'14 that were read as restriction, no obstruction.  She does have allergies, uses bee pollen for this.    Review of Systems  Constitutional: Negative for fever and unexpected weight change.  HENT: Negative for congestion, dental problem, ear pain, nosebleeds, postnasal drip, rhinorrhea, sinus pressure, sneezing, sore throat and trouble swallowing.   Eyes: Negative for redness and itching.  Respiratory: Positive for cough, chest tightness, shortness of breath and wheezing.   Cardiovascular: Negative for palpitations and leg swelling.  Gastrointestinal: Negative for nausea and vomiting.  Genitourinary: Negative for dysuria.  Musculoskeletal: Negative for joint swelling.  Skin: Negative for rash.  Neurological: Positive for headaches.  Hematological: Does not bruise/bleed easily.  Psychiatric/Behavioral: Negative for dysphoric mood. The patient is not nervous/anxious.    Past Medical History  Diagnosis Date  . Diverticulitis   . Arthritis   . Asthma   . Mixed hyperlipidemia   . Essential hypertension, benign   . Tendinitis     Patient reports chronic problem involving her arms and hands  . Chronic back pain     Patient reports lower back disc problems  . Myocardial infarction     Patient reports related to "steroids" in 1985 ([place])  . High blood pressure   . High cholesterol      No family history on file.   History   Social History  . Marital Status: Married    Spouse Name: N/A    Number of Children: N/A  . Years of Education: N/A   Occupational History  .  Unemployed    Social History Main Topics  . Smoking status: Former Smoker -- 2.00 packs/day for 12 years    Types: Cigarettes    Quit date: 03/13/1985  . Smokeless tobacco: Not on file  . Alcohol Use: No  . Drug Use: No  . Sexual Activity: Not on file   Other Topics Concern  . Not on file   Social History Narrative  . No narrative on file     Allergies  Allergen Reactions  . Morphine And Related Other (See Comments)    welps  . Naproxen Other (See Comments)    Lightheadedness  . Ibuprofen Rash  . Penicillins Rash     Outpatient Prescriptions Prior to Visit  Medication Sig Dispense Refill  . acetaminophen (TYLENOL) 500 MG tablet Take 1,000 mg by mouth every 6 (six) hours as needed. Pain      . albuterol (PROVENTIL HFA;VENTOLIN HFA) 108 (90 BASE) MCG/ACT inhaler Inhale 2 puffs into the lungs every 6 (six) hours as needed for wheezing.      Marland Kitchen. aspirin EC 81 MG tablet Take 81 mg by mouth daily.      . Cholecalciferol (VITAMIN D PO) Take 2,000 Units by mouth daily.       . fish oil-omega-3 fatty acids 1000 MG capsule Take 2 g by mouth daily.      . hydrochlorothiazide (HYDRODIURIL) 25 MG tablet Take 25 mg by mouth daily.      . magnesium oxide (  MAG-OX) 400 MG tablet Take 400 mg by mouth daily.      . Melatonin-Pyridoxine (MELATIN PO) Take 3 mg by mouth daily.       . simvastatin (ZOCOR) 20 MG tablet Take 20 mg by mouth at bedtime.      Marland Kitchen VITAMIN E PO Take 1 tablet by mouth daily.       No facility-administered medications prior to visit.         Objective:   Physical Exam Filed Vitals:   04/18/13 1420  BP: 128/86  Pulse: 78  Height: 5\' 2"  (1.575 m)  Weight: 191 lb (86.637 kg)  SpO2: 97%   Gen: Pleasant, overwt, in no distress,  normal affect  ENT: No lesions,  mouth clear,  oropharynx clear, no postnasal drip  Neck: No JVD, no TMG, no carotid bruits  Lungs: No use of accessory muscles, clear without rales or rhonchi  Cardiovascular: RRR, heart sounds normal,  no murmur or gallops, no peripheral edema  Musculoskeletal: No deformities, no cyanosis or clubbing  Neuro: alert, non focal  Skin: Warm, no lesions or rashes      Assessment & Plan:  Dyspnea Carries hx asthma and uses albuterol several times a day, but her spirometry report does not describe obstruction. I don;t have the raw data yet. Will obtain this and then call her regarding starting an inhaled steroid as a trial to see if she benefits.

## 2013-04-18 NOTE — Patient Instructions (Signed)
We [name] to review We will call you to discuss these results and to discuss whether you would benefit from a scheduled inhaled medication Follow with Dr Delton CoombesByrum in 3 months or sooner if you have any problems.

## 2013-04-18 NOTE — Assessment & Plan Note (Signed)
Carries hx asthma and uses albuterol several times a day, but her spirometry report does not describe obstruction. I don;t have the raw data yet. Will obtain this and then call her regarding starting an inhaled steroid as a trial to see if she benefits.

## 2013-05-27 ENCOUNTER — Ambulatory Visit (INDEPENDENT_AMBULATORY_CARE_PROVIDER_SITE_OTHER): Payer: Self-pay | Admitting: Cardiology

## 2013-05-27 ENCOUNTER — Encounter: Payer: Self-pay | Admitting: Cardiology

## 2013-05-27 VITALS — BP 158/74 | HR 65 | Ht 62.0 in | Wt 191.4 lb

## 2013-05-27 DIAGNOSIS — I10 Essential (primary) hypertension: Secondary | ICD-10-CM

## 2013-05-27 DIAGNOSIS — R9430 Abnormal result of cardiovascular function study, unspecified: Secondary | ICD-10-CM

## 2013-05-27 DIAGNOSIS — E782 Mixed hyperlipidemia: Secondary | ICD-10-CM

## 2013-05-27 NOTE — Assessment & Plan Note (Signed)
Continue management for ischemic heart disease. We have discussed diet and exercise. She continues on aspirin, statin, omega-3 supplements.

## 2013-05-27 NOTE — Assessment & Plan Note (Signed)
Blood pressure is elevated today. Keep follow up with primary care provider. Might benefit from an ARB or ACE inhibitor.

## 2013-05-27 NOTE — Patient Instructions (Signed)
Your physician wants you to follow-up in: 6 months You will receive a reminder letter in the mail two months in advance. If you don't receive a letter, please call our office to schedule the follow-up appointment.     Your physician recommends that you continue on your current medications as directed. Please refer to the Current Medication list given to you today.      Thank you for choosing [facility] !        

## 2013-05-27 NOTE — Assessment & Plan Note (Signed)
Continues on statin therapy, recent lipid numbers reviewed with HDL 60 and LDL 87.

## 2013-05-27 NOTE — Progress Notes (Signed)
Clinical Summary Ms. Riano is a 53 y.o.female last seen in the office in November 2014. She is here with her significant other. She states that she has been under a lot of emotional stress, has had some chest pain symptoms, but nothing progressive. No hospitalizations. She states that she has been compliant with her medications. She also tells me that she has a treadmill at home and that she tries to walk on it for 10 minutes at a time, 3 times a day. We did discuss diet and a basic exercise regimen on a regular basis.  Lab work from January of this year showed hemoglobin 13.3, platelets 318, potassium 3.5, BUN 10, grade 0.5, cholesterol 163, triglycerides 78, HDL 60, LDL 87.  Dobutamine Cardiolite in September 2014 reported no diagnostic ST segment changes, small area of ischemia in the RCA distribution, LVEF 71%, overall low risk. Echocardiogram at that time demonstrated mild LVH with LVEF 65-70%, grade 1 diastolic dysfunction, trivial tricuspid regurgitation, unable to calculate PASP, CVP 8 mm mercury, no pericardial effusion.   Allergies  Allergen Reactions  . Metoprolol     Dizziness Elevated BP N & V  . Morphine And Related Other (See Comments)    welps  . Naproxen Other (See Comments)    Lightheadedness  . Ibuprofen Rash  . Penicillins Rash    Current Outpatient Prescriptions  Medication Sig Dispense Refill  . acetaminophen (TYLENOL) 500 MG tablet Take 1,000 mg by mouth every 6 (six) hours as needed. Pain      . albuterol (PROVENTIL HFA;VENTOLIN HFA) 108 (90 BASE) MCG/ACT inhaler Inhale 2 puffs into the lungs every 6 (six) hours as needed for wheezing.      Marland Kitchen. aspirin EC 81 MG tablet Take 81 mg by mouth daily.      . Biotin 5000 MCG CAPS Take 1 capsule by mouth daily.      . Cholecalciferol (VITAMIN D PO) Take 2,000 Units by mouth daily.       Hassan Buckler. Cinnamon Bark POWD 1 capsule by Does not apply route daily.      . fish oil-omega-3 fatty acids 1000 MG capsule Take 2 g by mouth  daily.      . Flaxseed, Linseed, (FLAX SEEDS PO) Take 1 scoop by mouth daily.       . folic acid (FOLVITE) 400 MCG tablet Take 400 mcg by mouth daily.      . hydrochlorothiazide (HYDRODIURIL) 25 MG tablet Take 25 mg by mouth daily.      . Melatonin 3 MG CAPS Take 1 capsule by mouth daily.      . Melatonin-Pyridoxine (MELATIN PO) Take 3 mg by mouth daily.       . simvastatin (ZOCOR) 20 MG tablet Take 20 mg by mouth at bedtime.      Marland Kitchen. VITAMIN E PO Take 1 tablet by mouth daily.       No current facility-administered medications for this visit.    Past Medical History  Diagnosis Date  . Diverticulitis   . Arthritis   . Asthma   . Mixed hyperlipidemia   . Essential hypertension, benign   . Tendinitis     Patient reports chronic problem involving her arms and hands  . Chronic back pain     Patient reports lower back disc problems  . Myocardial infarction     Patient reports related to "steroids" in 1985 ([place])  . High blood pressure   . High cholesterol     Social  History Ms. Grell reports that she quit smoking about 28 years ago. Her smoking use included Cigarettes. She has a 24 pack-year smoking history. She does not have any smokeless tobacco history on file. Ms. Mayfield reports that she does not drink alcohol.  Review of Systems No palpitations or syncope. No reported bleeding episodes. Reports arthritic pains. Otherwise negative.  Physical Examination Filed Vitals:   05/27/13 1337  BP: 158/74  Pulse: 65   Filed Weights   05/27/13 1337  Weight: 191 lb 6.4 oz (86.818 kg)   Appears comfortable at rest.  HEENT: Conjunctiva and lids normal, oropharynx clear.  Neck: Supple, no elevated JVP or carotid bruits, no thyromegaly.  Lungs: Clear to auscultation, nonlabored breathing at rest.  Cardiac: Regular rate and rhythm, no S3 or significant systolic murmur, no pericardial rub.  Abdomen: Soft, nontender, bowel sounds present, no guarding or rebound.  Extremities: Trace ankle  edema, distal pulses 1-2+.    Problem List and Plan   Abnormal cardiovascular function study Continue management for ischemic heart disease. We have discussed diet and exercise. She continues on aspirin, statin, omega-3 supplements.  Essential hypertension, benign Blood pressure is elevated today. Keep follow up with primary care provider. Might benefit from an ARB or ACE inhibitor.  Mixed hyperlipidemia Continues on statin therapy, recent lipid numbers reviewed with HDL 60 and LDL 87.    Jonelle Sidle, M.D., F.A.C.C.

## 2013-06-05 ENCOUNTER — Emergency Department (HOSPITAL_COMMUNITY): Payer: Self-pay

## 2013-06-05 ENCOUNTER — Emergency Department (HOSPITAL_COMMUNITY)
Admission: EM | Admit: 2013-06-05 | Discharge: 2013-06-05 | Disposition: A | Discharge status: Home / Self Care | Payer: Self-pay | Attending: Emergency Medicine | Admitting: Emergency Medicine

## 2013-06-05 ENCOUNTER — Encounter (HOSPITAL_COMMUNITY): Payer: Self-pay | Admitting: Emergency Medicine

## 2013-06-05 DIAGNOSIS — R51 Headache: Secondary | ICD-10-CM | POA: Insufficient documentation

## 2013-06-05 DIAGNOSIS — Z7982 Long term (current) use of aspirin: Secondary | ICD-10-CM | POA: Insufficient documentation

## 2013-06-05 DIAGNOSIS — R109 Unspecified abdominal pain: Secondary | ICD-10-CM

## 2013-06-05 DIAGNOSIS — R079 Chest pain, unspecified: Secondary | ICD-10-CM | POA: Insufficient documentation

## 2013-06-05 DIAGNOSIS — K625 Hemorrhage of anus and rectum: Secondary | ICD-10-CM | POA: Insufficient documentation

## 2013-06-05 DIAGNOSIS — I10 Essential (primary) hypertension: Secondary | ICD-10-CM | POA: Insufficient documentation

## 2013-06-05 DIAGNOSIS — G8929 Other chronic pain: Secondary | ICD-10-CM | POA: Insufficient documentation

## 2013-06-05 DIAGNOSIS — I252 Old myocardial infarction: Secondary | ICD-10-CM | POA: Insufficient documentation

## 2013-06-05 DIAGNOSIS — E782 Mixed hyperlipidemia: Secondary | ICD-10-CM | POA: Insufficient documentation

## 2013-06-05 DIAGNOSIS — R42 Dizziness and giddiness: Secondary | ICD-10-CM | POA: Insufficient documentation

## 2013-06-05 DIAGNOSIS — Z79899 Other long term (current) drug therapy: Secondary | ICD-10-CM | POA: Insufficient documentation

## 2013-06-05 DIAGNOSIS — R110 Nausea: Secondary | ICD-10-CM | POA: Insufficient documentation

## 2013-06-05 DIAGNOSIS — R509 Fever, unspecified: Secondary | ICD-10-CM | POA: Insufficient documentation

## 2013-06-05 DIAGNOSIS — R1032 Left lower quadrant pain: Secondary | ICD-10-CM | POA: Insufficient documentation

## 2013-06-05 DIAGNOSIS — Z87891 Personal history of nicotine dependence: Secondary | ICD-10-CM | POA: Insufficient documentation

## 2013-06-05 DIAGNOSIS — M129 Arthropathy, unspecified: Secondary | ICD-10-CM | POA: Insufficient documentation

## 2013-06-05 DIAGNOSIS — J3489 Other specified disorders of nose and nasal sinuses: Secondary | ICD-10-CM | POA: Insufficient documentation

## 2013-06-05 DIAGNOSIS — M549 Dorsalgia, unspecified: Secondary | ICD-10-CM | POA: Insufficient documentation

## 2013-06-05 DIAGNOSIS — Z880 Allergy status to penicillin: Secondary | ICD-10-CM | POA: Insufficient documentation

## 2013-06-05 DIAGNOSIS — J45909 Unspecified asthma, uncomplicated: Secondary | ICD-10-CM | POA: Insufficient documentation

## 2013-06-05 DIAGNOSIS — Z9889 Other specified postprocedural states: Secondary | ICD-10-CM | POA: Insufficient documentation

## 2013-06-05 DIAGNOSIS — Z8673 Personal history of transient ischemic attack (TIA), and cerebral infarction without residual deficits: Secondary | ICD-10-CM | POA: Insufficient documentation

## 2013-06-05 DIAGNOSIS — E7800 Pure hypercholesterolemia, unspecified: Secondary | ICD-10-CM | POA: Insufficient documentation

## 2013-06-05 HISTORY — DX: Cerebral infarction, unspecified: I63.9

## 2013-06-05 LAB — CBC WITH DIFFERENTIAL/PLATELET
BASOS PCT: 0 % (ref 0–1)
Basophils Absolute: 0 10*3/uL (ref 0.0–0.1)
Eosinophils Absolute: 0.1 10*3/uL (ref 0.0–0.7)
Eosinophils Relative: 1 % (ref 0–5)
HCT: 37 % (ref 36.0–46.0)
Hemoglobin: 13.1 g/dL (ref 12.0–15.0)
LYMPHS PCT: 15 % (ref 12–46)
Lymphs Abs: 1.5 10*3/uL (ref 0.7–4.0)
MCH: 30 pg (ref 26.0–34.0)
MCHC: 35.4 g/dL (ref 30.0–36.0)
MCV: 84.7 fL (ref 78.0–100.0)
Monocytes Absolute: 0.6 10*3/uL (ref 0.1–1.0)
Monocytes Relative: 6 % (ref 3–12)
NEUTROS ABS: 8 10*3/uL — AB (ref 1.7–7.7)
NEUTROS PCT: 78 % — AB (ref 43–77)
Platelets: 309 10*3/uL (ref 150–400)
RBC: 4.37 MIL/uL (ref 3.87–5.11)
RDW: 13 % (ref 11.5–15.5)
WBC: 10.2 10*3/uL (ref 4.0–10.5)

## 2013-06-05 LAB — HEPATIC FUNCTION PANEL
ALT: 29 U/L (ref 0–35)
AST: 21 U/L (ref 0–37)
Albumin: 3.7 g/dL (ref 3.5–5.2)
Alkaline Phosphatase: 52 U/L (ref 39–117)
Total Bilirubin: 0.2 mg/dL — ABNORMAL LOW (ref 0.3–1.2)
Total Protein: 8.1 g/dL (ref 6.0–8.3)

## 2013-06-05 LAB — LIPASE, BLOOD: Lipase: 35 U/L (ref 11–59)

## 2013-06-05 LAB — URINE MICROSCOPIC-ADD ON

## 2013-06-05 LAB — URINALYSIS, ROUTINE W REFLEX MICROSCOPIC
Bilirubin Urine: NEGATIVE
Glucose, UA: NEGATIVE mg/dL
KETONES UR: NEGATIVE mg/dL
LEUKOCYTES UA: NEGATIVE
NITRITE: NEGATIVE
Protein, ur: NEGATIVE mg/dL
SPECIFIC GRAVITY, URINE: 1.025 (ref 1.005–1.030)
Urobilinogen, UA: 0.2 mg/dL (ref 0.0–1.0)
pH: 5.5 (ref 5.0–8.0)

## 2013-06-05 LAB — BASIC METABOLIC PANEL
BUN: 17 mg/dL (ref 6–23)
CHLORIDE: 95 meq/L — AB (ref 96–112)
CO2: 28 mEq/L (ref 19–32)
CREATININE: 0.54 mg/dL (ref 0.50–1.10)
Calcium: 9.3 mg/dL (ref 8.4–10.5)
GFR calc non Af Amer: >90 mL/min (ref >90)
Glucose, Bld: 133 mg/dL — ABNORMAL HIGH (ref 70–99)
POTASSIUM: 3.3 meq/L — AB (ref 3.7–5.3)
SODIUM: 135 meq/L — AB (ref 137–147)

## 2013-06-05 MED ORDER — IOHEXOL 300 MG/ML  SOLN
100.0000 mL | Freq: Once | INTRAMUSCULAR | Status: AC | PRN
  Administered 2013-06-05: 100 mL via INTRAVENOUS

## 2013-06-05 MED ORDER — ONDANSETRON HCL 4 MG/2ML IJ SOLN
4.0000 mg | Freq: Once | INTRAMUSCULAR | Status: DC
  Filled 2013-06-05: qty 2

## 2013-06-05 MED ORDER — SODIUM CHLORIDE 0.9 % IV SOLN: INTRAVENOUS | Status: DC

## 2013-06-05 MED ORDER — SODIUM CHLORIDE 0.9 % IJ SOLN
INTRAMUSCULAR | Status: DC
Start: 2013-06-05 — End: 2013-06-05
  Filled 2013-06-05: qty 250

## 2013-06-05 MED ORDER — HYDROMORPHONE HCL PF 1 MG/ML IJ SOLN
0.5000 mg | Freq: Once | INTRAMUSCULAR | Status: AC
  Administered 2013-06-05: 0.5 mg via INTRAVENOUS
  Filled 2013-06-05: qty 1

## 2013-06-05 MED ORDER — HYDROMORPHONE HCL PF 1 MG/ML IJ SOLN
INTRAMUSCULAR | Status: AC
  Filled 2013-06-05: qty 1

## 2013-06-05 MED ORDER — IOHEXOL 300 MG/ML  SOLN
50.0000 mL | Freq: Once | INTRAMUSCULAR | Status: AC | PRN
  Administered 2013-06-05: 50 mL via ORAL

## 2013-06-05 MED ORDER — SODIUM CHLORIDE 0.9 % IV BOLUS (SEPSIS)
250.0000 mL | Freq: Once | INTRAVENOUS | Status: AC
  Administered 2013-06-05: 250 mL via INTRAVENOUS

## 2013-06-05 MED ORDER — TRAMADOL HCL 50 MG PO TABS: 50.0000 mg | ORAL_TABLET | Freq: Four times a day (QID) | ORAL | Status: DC | PRN

## 2013-06-05 NOTE — ED Notes (Signed)
Pt has HX of Diverticulitis, abdominal pain, nausea, and rectal bleeding noted on toilet paper.

## 2013-06-05 NOTE — Discharge Instructions (Signed)

## 2013-06-05 NOTE — ED Provider Notes (Addendum)
CSN: [id number]     Arrival date & time 06/05/13  1523 History  This chart was scribed for Shelda Jakes, MD by Marica Otter, ED scribe.  This patient was seen in room APA09/APA09 and the patient's care was started at 4:25 PM.   Chief Complaint  Patient presents with  . Abdominal Pain    Patient is a 53 y.o. female presenting with abdominal pain. The history is provided by the patient. No language interpreter was used.  Abdominal Pain Pain location:  LLQ Pain quality: burning   Pain radiates to:  RLQ Associated symptoms: chest pain, chills, fever and nausea   Associated symptoms: no dysuria, no shortness of breath and no vomiting    HPI Comments: Laiyla Slagel is a 53 y.o. female, with a history of high blood pressure, high cholesterol, myocardial infarction and stroke, who presents to the Emergency Department complaining of consistent, worsening LLQ abd pain radiating to the RUQ onset 4 days ago. Pt describes the pain as a burning pain and rates the pain a 7 out of 10. Pt also complains of associated rectal bleeding, rectal pain, and  nausea onset 4 days ago. Pt describes her rectal pain a 9 out of 10. Pt states she has not been able to have a "decent" bowel movement since the onset of the abd pain 4 days ago. Pt denies being on any meds that are blood thinners aside from taking a daily aspirin.    Past Medical History  Diagnosis Date  . Diverticulitis   . Arthritis   . Asthma   . Mixed hyperlipidemia   . Essential hypertension, benign   . Tendinitis     Patient reports chronic problem involving her arms and hands  . Chronic back pain     Patient reports lower back disc problems  . Myocardial infarction     Patient reports related to "steroids" in 1985 ([place])  . High blood pressure   . High cholesterol   . Stroke     Past Surgical History  Procedure Laterality Date  . Cesarean section    . Tonsillectomy      History reviewed. No pertinent family  history.   History  Substance Use Topics  . Smoking status: Former Smoker -- 2.00 packs/day for 12 years    Types: Cigarettes    Quit date: 03/13/1985  . Smokeless tobacco: Not on file  . Alcohol Use: No    OB History   Grav Para Term Preterm Abortions TAB SAB Ect Mult Living                   Review of Systems  Constitutional: Positive for fever and chills.  HENT: Positive for congestion and rhinorrhea.   Eyes: Negative for visual disturbance.       Visual Change  Respiratory: Negative for shortness of breath.   Cardiovascular: Positive for chest pain.  Gastrointestinal: Positive for nausea and abdominal pain. Negative for vomiting.  Genitourinary: Negative for dysuria.  Musculoskeletal: Positive for back pain. Negative for joint swelling.  Skin: Negative for rash.  Neurological: Positive for dizziness and headaches.  Hematological: Does not bruise/bleed easily.  Psychiatric/Behavioral: Negative for confusion.      Allergies  Metoprolol; Morphine and related; Naproxen; Ibuprofen; and Penicillins  Home Medications   Current Outpatient Rx  Name  Route  Sig  Dispense  Refill  . albuterol (PROVENTIL HFA;VENTOLIN HFA) 108 (90 BASE) MCG/ACT inhaler   Inhalation   Inhale 2 puffs into the  lungs every 6 (six) hours as needed for wheezing.         Marland Kitchen aspirin EC 81 MG tablet   Oral   Take 81 mg by mouth 2 (two) times daily.          Marland Kitchen B-Complex TABS   Oral   Take 1 tablet by mouth daily.         . Biotin 5000 MCG CAPS   Oral   Take 1 capsule by mouth once a week.          . Cholecalciferol (VITAMIN D) 2000 UNITS CAPS   Oral   Take 1 capsule by mouth daily.         Thressa Sheller POWD   Does not apply   1 capsule by Does not apply route daily. Sprinkles on food once daily         . fish oil-omega-3 fatty acids 1000 MG capsule   Oral   Take 2 g by mouth daily.         . Flaxseed, Linseed, (FLAX SEEDS PO)   Oral   Take 1 scoop by mouth daily.           . hydrochlorothiazide (HYDRODIURIL) 25 MG tablet   Oral   Take 25 mg by mouth every morning.          . Melatonin 3 MG CAPS   Oral   Take 1 capsule by mouth at bedtime as needed (for sleep).          . Multiple Vitamin (MULTIVITAMIN WITH MINERALS) TABS tablet   Oral   Take 1 tablet by mouth daily.         . simvastatin (ZOCOR) 20 MG tablet   Oral   Take 20 mg by mouth at bedtime.         . Turmeric POWD   Oral   Take 1 scoop by mouth 2 (two) times daily.         . traMADol (ULTRAM) 50 MG tablet   Oral   Take 1 tablet (50 mg total) by mouth every 6 (six) hours as needed.   20 tablet   0    BP 154/78  Pulse 79  Temp(Src) 97.6 F (36.4 C) (Oral)  Resp 20  Ht 5\' 2"  (1.575 m)  Wt 191 lb (86.637 kg)  BMI 34.93 kg/m2  SpO2 95%  LMP 12/26/2011  Physical Exam  Nursing note and vitals reviewed. Constitutional: She is oriented to person, place, and time. She appears well-developed and well-nourished. No distress.  HENT:  Head: Normocephalic and atraumatic.  Eyes: EOM are normal.  Neck: Neck supple. No tracheal deviation present.  Cardiovascular: Normal rate and regular rhythm.   No murmur heard. Pulmonary/Chest: Effort normal. No respiratory distress.  Abdominal: Bowel sounds are normal. There is tenderness (LLQ, left greater than right). There is guarding (Minor guarding on left side).  Genitourinary:  No evidence of fissure. Some mild hemorrhoids. No active bleeding.  Musculoskeletal: Normal range of motion.  Neurological: She is alert and oriented to person, place, and time. No cranial nerve deficit. She exhibits normal muscle tone. Coordination normal.  Skin: Skin is warm and dry.  Psychiatric: She has a normal mood and affect. Her behavior is normal.    ED Course  Procedures (including critical care time)  DIAGNOSTIC STUDIES: Oxygen Saturation is 95% on room air, adequate by my interpretation.    COORDINATION OF CARE: 4:30 PM-Discussed  treatment plan which includes administering  pain meds with pt at bedside and pt requested lower dose and  agreed to plan.     Labs Review Labs Reviewed  CBC WITH DIFFERENTIAL - Abnormal; Notable for the following:    Neutrophils Relative % 78 (*)    Neutro Abs 8.0 (*)    All other components within normal limits  BASIC METABOLIC PANEL - Abnormal; Notable for the following:    Sodium 135 (*)    Potassium 3.3 (*)    Chloride 95 (*)    Glucose, Bld 133 (*)    All other components within normal limits  URINALYSIS, ROUTINE W REFLEX MICROSCOPIC - Abnormal; Notable for the following:    Hgb urine dipstick TRACE (*)    All other components within normal limits  HEPATIC FUNCTION PANEL - Abnormal; Notable for the following:    Total Bilirubin 0.2 (*)    All other components within normal limits  URINE MICROSCOPIC-ADD ON - Abnormal; Notable for the following:    Squamous Epithelial / LPF FEW (*)    Bacteria, UA FEW (*)    All other components within normal limits  LIPASE, BLOOD   Results for orders placed during the hospital encounter of 06/05/13  CBC WITH DIFFERENTIAL      Result Value Ref Range   WBC 10.2  4.0 - 10.5 K/uL   RBC 4.37  3.87 - 5.11 MIL/uL   Hemoglobin 13.1  12.0 - 15.0 g/dL   HCT 96.037.0  45.436.0 - 09.846.0 %   MCV 84.7  78.0 - 100.0 fL   MCH 30.0  26.0 - 34.0 pg   MCHC 35.4  30.0 - 36.0 g/dL   RDW 11.913.0  14.711.5 - 82.915.5 %   Platelets 309  150 - 400 K/uL   Neutrophils Relative % 78 (*) 43 - 77 %   Neutro Abs 8.0 (*) 1.7 - 7.7 K/uL   Lymphocytes Relative 15  12 - 46 %   Lymphs Abs 1.5  0.7 - 4.0 K/uL   Monocytes Relative 6  3 - 12 %   Monocytes Absolute 0.6  0.1 - 1.0 K/uL   Eosinophils Relative 1  0 - 5 %   Eosinophils Absolute 0.1  0.0 - 0.7 K/uL   Basophils Relative 0  0 - 1 %   Basophils Absolute 0.0  0.0 - 0.1 K/uL  BASIC METABOLIC PANEL      Result Value Ref Range   Sodium 135 (*) 137 - 147 mEq/L   Potassium 3.3 (*) 3.7 - 5.3 mEq/L   Chloride 95 (*) 96 - 112 mEq/L    CO2 28  19 - 32 mEq/L   Glucose, Bld 133 (*) 70 - 99 mg/dL   BUN 17  6 - 23 mg/dL   Creatinine, Ser 5.620.54  0.50 - 1.10 mg/dL   Calcium 9.3  8.4 - 13.010.5 mg/dL   GFR calc non Af Amer >90  >90 mL/min   GFR calc Af Amer >90  >90 mL/min  URINALYSIS, ROUTINE W REFLEX MICROSCOPIC      Result Value Ref Range   Color, Urine YELLOW  YELLOW   APPearance CLEAR  CLEAR   Specific Gravity, Urine 1.025  1.005 - 1.030   pH 5.5  5.0 - 8.0   Glucose, UA NEGATIVE  NEGATIVE mg/dL   Hgb urine dipstick TRACE (*) NEGATIVE   Bilirubin Urine NEGATIVE  NEGATIVE   Ketones, ur NEGATIVE  NEGATIVE mg/dL   Protein, ur NEGATIVE  NEGATIVE mg/dL   Urobilinogen, UA  0.2  0.0 - 1.0 mg/dL   Nitrite NEGATIVE  NEGATIVE   Leukocytes, UA NEGATIVE  NEGATIVE  HEPATIC FUNCTION PANEL      Result Value Ref Range   Total Protein 8.1  6.0 - 8.3 g/dL   Albumin 3.7  3.5 - 5.2 g/dL   AST 21  0 - 37 U/L   ALT 29  0 - 35 U/L   Alkaline Phosphatase 52  39 - 117 U/L   Total Bilirubin 0.2 (*) 0.3 - 1.2 mg/dL   Bilirubin, Direct <1.6  0.0 - 0.3 mg/dL   Indirect Bilirubin NOT CALCULATED  0.3 - 0.9 mg/dL  LIPASE, BLOOD      Result Value Ref Range   Lipase 35  11 - 59 U/L  URINE MICROSCOPIC-ADD ON      Result Value Ref Range   Squamous Epithelial / LPF FEW (*) RARE   WBC, UA 0-2  <3 WBC/hpf   RBC / HPF 0-2  <3 RBC/hpf   Bacteria, UA FEW (*) RARE    Imaging Review Ct Abdomen Pelvis W Contrast  06/05/2013   CLINICAL DATA:  Abdominal pain, nausea and rectal bleeding.  EXAM: CT ABDOMEN AND PELVIS WITH CONTRAST  TECHNIQUE: Multidetector CT imaging of the abdomen and pelvis was performed using the standard protocol following bolus administration of intravenous contrast.  CONTRAST:  50mL OMNIPAQUE IOHEXOL 300 MG/ML SOLN, OMNIPAQUE IOHEXOL 300 MG/ML SOLN  COMPARISON:  02/14/2012.  FINDINGS: Mild diffuse low density of the liver relative to the spleen. Stable 7 mm rounded low density in the right lobe of the liver on image number 14.  Unremarkable spleen, pancreas, gallbladder, adrenal glands, kidneys, urinary bladder, uterus and ovaries. Multiple colonic diverticula without evidence of diverticulitis. No evidence of appendicitis. No gastric or small bowel abnormalities and no enlarged lymph nodes. No free peritoneal fluid or air. Minimal linear scarring at the left lung base. Small L2 and S1 vertebral bone islands.  IMPRESSION: 1. No acute abnormality. 2. Colonic diverticulosis without evidence of diverticulitis. 3. Mild diffuse hepatic steatosis. 4. Stable small right lobe liver cyst.   Electronically Signed   By: Gordan Payment M.D.   On: 06/05/2013 17:55     EKG Interpretation None      MDM   Final diagnoses:  Abdominal pain  Rectal bleeding    Patient's CT scan negative for constipation or diverticulitis. Patient does have diverticulosis but no evidence of infection. Patient also noted to have hemorrhoids without any active bleeding currently that could be the source of the blood. Patient will followup with her primary care doctor probably needs a repeat colonoscopy. Will return for any newer worse symptoms to include worse bleeding. Patient without leukocytosis no significant anemia. No sniffing lab abnormalities. Patient is much more comfortable now. We'll continue tramadol at home.    I personally performed the services described in this documentation, which was scribed in my presence. The recorded information has been reviewed and is accurate.     Shelda Jakes, MD 06/05/13 Julian Reil  Shelda Jakes, MD 06/05/13 [phone]

## 2013-07-29 ENCOUNTER — Ambulatory Visit: Payer: Self-pay | Admitting: Emergency Medicine

## 2013-08-07 ENCOUNTER — Other Ambulatory Visit (HOSPITAL_COMMUNITY): Payer: Self-pay | Admitting: Physician Assistant

## 2013-08-07 DIAGNOSIS — Z1231 Encounter for screening mammogram for malignant neoplasm of breast: Secondary | ICD-10-CM

## 2013-08-15 ENCOUNTER — Ambulatory Visit (HOSPITAL_COMMUNITY): Payer: Self-pay

## 2013-08-15 ENCOUNTER — Ambulatory Visit: Payer: Self-pay | Admitting: Emergency Medicine

## 2013-08-18 ENCOUNTER — Ambulatory Visit: Payer: Self-pay | Admitting: Emergency Medicine

## 2013-09-15 ENCOUNTER — Encounter: Payer: Self-pay | Admitting: Internal Medicine

## 2013-09-15 ENCOUNTER — Ambulatory Visit (INDEPENDENT_AMBULATORY_CARE_PROVIDER_SITE_OTHER): Payer: Self-pay | Admitting: Internal Medicine

## 2013-09-15 ENCOUNTER — Encounter (INDEPENDENT_AMBULATORY_CARE_PROVIDER_SITE_OTHER): Payer: Self-pay

## 2013-09-15 VITALS — BP 132/78 | HR 54 | Temp 97.7°F | Ht 61.0 in | Wt 181.0 lb

## 2013-09-15 DIAGNOSIS — J45909 Unspecified asthma, uncomplicated: Secondary | ICD-10-CM | POA: Insufficient documentation

## 2013-09-15 DIAGNOSIS — J4520 Mild intermittent asthma, uncomplicated: Secondary | ICD-10-CM

## 2013-09-15 NOTE — Assessment & Plan Note (Addendum)
-    Pfts  01/13/13 no convincing obst   The proper method of use, as well as anticipated side effects, of a metered-dose inhaler are discussed and demonstrated to the patient. Improved effectiveness after extensive coaching during this visit to a level of approximately  75% from a basline of < 25%  So not really clear this is asthma but the pfts don't rule it out if it's intermittent, based on exposures that she now knows how to avoid    Rule of two's reviewed, pulmonary f/u can be prn

## 2013-09-15 NOTE — Progress Notes (Signed)
Subjective:   Patient ID: Lisa NewportFrances [name], female    DOB: Jul 06, 1960  MRN: [id number]    Brief patient profile:  53 yo former smoker, quit in 1987  hx CAD and prior MI, HTN, carries a hx of asthma made in '94. She did have PFT at that time in [place]. She was seen at [facility] in [place] for chest discomfort, cough and dyspnea. Has been evaluated by Dr Diona BrownerMcDowell with TTE and stress testing 10/14. She underwent PFT at [facility] [place] 11/'14 that were read as restriction, no obstruction.  She does have allergies, uses bee pollen for this.  rec We will [facility] to review We will call you to discuss these results and to discuss whether you would benefit from a scheduled inhaled medication Follow with Dr Delton CoombesByrum in 3 months or sooner if you have any problems.   09/15/2013 f/u ov/Lisa [name] re: ? Asthma/ rx = avoidance works so far Stage managerChief Complaint  Patient presents with  . Follow-up    Pt states that her breathing is overall doing well and denies any new co's today.      Not limited by breathing from desired activities    No obvious day to day or daytime variabilty or assoc chronic cough or cp or chest tightness, subjective wheeze overt sinus or hb symptoms. No unusual exp hx or h/o childhood pna/ asthma or knowledge of premature birth.  Sleeping ok without nocturnal  or early am exacerbation  of respiratory  c/o's or need for noct saba. Also denies any obvious fluctuation of symptoms with weather or environmental changes or other aggravating or alleviating factors except as outlined above   Current Medications, Allergies, Complete Past Medical History, Past Surgical History, Family History, and Social History were reviewed in [facility] electronic medical record.  ROS  The following are not active complaints unless bolded sore throat, dysphagia, dental problems, itching, sneezing,  nasal congestion or excess/ purulent secretions, ear ache,   fever, chills, sweats,  unintended wt loss, pleuritic or exertional cp, hemoptysis,  orthopnea pnd or leg swelling, presyncope, palpitations, heartburn, abdominal pain, anorexia, nausea, vomiting, diarrhea  or change in bowel or urinary habits, change in stools or urine, dysuria,hematuria,  rash, arthralgias, visual complaints, headache, numbness weakness or ataxia or problems with walking or coordination,  change in mood/affect or memory.           Past Medical History  Diagnosis Date  . Diverticulitis   . Arthritis   . Asthma   . Mixed hyperlipidemia   . Essential hypertension, benign   . Tendinitis     Patient reports chronic problem involving her arms and hands  . Chronic back pain     Patient reports lower back disc problems  . Myocardial infarction     Patient reports related to "steroids" in 1985 ([place])  . High blood pressure   . High cholesterol      No family history on file.   History   Social History  . Marital Status: Married    Spouse Name: N/A    Number of Children: N/A  . Years of Education: N/A   Occupational History  . Unemployed    Social History Main Topics  . Smoking status: Former Smoker -- 2.00 packs/day for 12 years    Types: Cigarettes    Quit date: 03/13/1985  . Smokeless tobacco: Not on file  . Alcohol Use: No  . Drug Use: No  . Sexual Activity: Not  on file   Other Topics Concern  . Not on file   Social History Narrative  . No narrative on file     Allergies  Allergen Reactions  . Morphine And Related Other (See Comments)    welps  . Naproxen Other (See Comments)    Lightheadedness  . Ibuprofen Rash  . Penicillins Rash     Outpatient Prescriptions Prior to Visit  Medication Sig Dispense Refill  . acetaminophen (TYLENOL) 500 MG tablet Take 1,000 mg by mouth every 6 (six) hours as needed. Pain      . albuterol (PROVENTIL HFA;VENTOLIN HFA) 108 (90 BASE) MCG/ACT inhaler Inhale 2 puffs into the lungs every 6 (six) hours as needed for wheezing.      Marland Kitchen.  aspirin EC 81 MG tablet Take 81 mg by mouth daily.      . Cholecalciferol (VITAMIN D PO) Take 2,000 Units by mouth daily.       . fish oil-omega-3 fatty acids 1000 MG capsule Take 2 g by mouth daily.      . hydrochlorothiazide (HYDRODIURIL) 25 MG tablet Take 25 mg by mouth daily.      . magnesium oxide (MAG-OX) 400 MG tablet Take 400 mg by mouth daily.      . Melatonin-Pyridoxine (MELATIN PO) Take 3 mg by mouth daily.       . simvastatin (ZOCOR) 20 MG tablet Take 20 mg by mouth at bedtime.      Marland Kitchen. VITAMIN E PO Take 1 tablet by mouth daily.       No facility-administered medications prior to visit.         Objective:   Physical Exam  Wt Readings from Last 3 Encounters:  09/15/13 181 lb (82.101 kg)  06/05/13 191 lb (86.637 kg)  05/27/13 191 lb 6.4 oz (86.818 kg)      Gen: Pleasant, overwt, in no distress,  normal affect  ENT: No lesions,  mouth clear,  oropharynx clear, no postnasal drip  Neck: No JVD, no TMG, no carotid bruits  Lungs: No use of accessory muscles, clear without rales or rhonchi  Cardiovascular: RRR, heart sounds normal, no murmur or gallops, no peripheral edema  Musculoskeletal: No deformities, no cyanosis or clubbing  Neuro: alert, non focal  Skin: Warm, no lesions or rashes      Assessment & Plan:

## 2013-09-15 NOTE — Patient Instructions (Addendum)
Only use your albuterol (ventolin) as a rescue medication to be used if you can't catch your breath by resting or doing a relaxed purse lip breathing pattern.  - The less you use it, the better it will work when you need it. - Ok to use up to 2 puffs  every 4 hours if you must but call for immediate appointment if use goes up over your usual need - Don't leave home without it !!  (think of it like the spare tire for your car)   Work on inhaler technique:  relax and gently blow all the way out then take a nice smooth deep breath back in, triggering the inhaler at same time you start breathing in.  Hold for up to 5 seconds if you can.  Rinse and gargle with water when done  If you need to use your rescue inhaler more than twice a weekly we need to see you right away. Otherwise pulmonary follow up is as needed

## 2013-09-18 ENCOUNTER — Ambulatory Visit: Payer: Self-pay | Admitting: Emergency Medicine

## 2013-09-19 ENCOUNTER — Other Ambulatory Visit (HOSPITAL_COMMUNITY): Payer: Self-pay | Admitting: Physician Assistant

## 2013-09-19 ENCOUNTER — Ambulatory Visit (HOSPITAL_COMMUNITY): Admission: RE | Admit: 2013-09-19 | Payer: Self-pay | Source: Ambulatory Visit

## 2013-09-19 DIAGNOSIS — Z1231 Encounter for screening mammogram for malignant neoplasm of breast: Secondary | ICD-10-CM

## 2013-09-22 ENCOUNTER — Encounter (HOSPITAL_COMMUNITY): Payer: Self-pay

## 2013-09-24 ENCOUNTER — Ambulatory Visit (HOSPITAL_COMMUNITY)
Admission: RE | Admit: 2013-09-24 | Discharge: 2013-09-24 | Disposition: A | Discharge status: Home / Self Care | Payer: Self-pay | Source: Ambulatory Visit | Attending: Physician Assistant | Admitting: Physician Assistant

## 2013-09-24 ENCOUNTER — Encounter (HOSPITAL_COMMUNITY): Payer: Self-pay

## 2013-11-24 ENCOUNTER — Ambulatory Visit (INDEPENDENT_AMBULATORY_CARE_PROVIDER_SITE_OTHER): Payer: Self-pay | Admitting: Cardiology

## 2013-11-24 ENCOUNTER — Telehealth: Payer: Self-pay | Admitting: *Deleted

## 2013-11-24 ENCOUNTER — Encounter: Payer: Self-pay | Admitting: *Deleted

## 2013-11-24 ENCOUNTER — Encounter: Payer: Self-pay | Admitting: Cardiology

## 2013-11-24 VITALS — BP 138/92 | HR 66 | Ht 61.0 in | Wt 175.0 lb

## 2013-11-24 DIAGNOSIS — E782 Mixed hyperlipidemia: Secondary | ICD-10-CM

## 2013-11-24 DIAGNOSIS — R9430 Abnormal result of cardiovascular function study, unspecified: Secondary | ICD-10-CM

## 2013-11-24 DIAGNOSIS — I10 Essential (primary) hypertension: Secondary | ICD-10-CM

## 2013-11-24 MED ORDER — SIMVASTATIN 20 MG PO TABS: 20.0000 mg | ORAL_TABLET | Freq: Every day | ORAL | Status: DC

## 2013-11-24 NOTE — Progress Notes (Signed)
Clinical Summary Lisa [name] is a 53 y.o.female last seen in March. She is here with her significant other. Reports no angina symptoms, main complaints are of chronic lower back pain, also ongoing issues with stress. ECG today shows sinus rhythm with poor R wave progression anteriorly associated with decreased voltage, probable old inferior infarct pattern.  Dobutamine Cardiolite in September 2014 reported no diagnostic ST segment changes, small area of ischemia in the RCA distribution, LVEF 71%, overall low risk. Echocardiogram at that time demonstrated mild LVH with LVEF 65-70%, grade 1 diastolic dysfunction, trivial tricuspid regurgitation, unable to calculate PASP, CVP 8 mm mercury, no pericardial effusion.  We continue to manage her medically for apparent underlying ischemic heart disease, on aspirin and statin. She does need a refill on simvastatin.   Allergies  Allergen Reactions  . Metoprolol Nausea And Vomiting    Dizziness Elevated BP  . Morphine And Related Hives  . Naproxen Other (See Comments)    Lightheadedness  . Tramadol Nausea And Vomiting  . Ibuprofen Rash  . Penicillins Rash    Current Outpatient Prescriptions  Medication Sig Dispense Refill  . albuterol (PROVENTIL HFA;VENTOLIN HFA) 108 (90 BASE) MCG/ACT inhaler Inhale 2 puffs into the lungs every 6 (six) hours as needed for wheezing.      Marland Kitchen aspirin EC 81 MG tablet Take 81 mg by mouth 2 (two) times daily.       Marland Kitchen B-Complex TABS Take 1 tablet by mouth daily.      . Biotin 5000 MCG CAPS Take 1 capsule by mouth once a week.       . Cholecalciferol (VITAMIN D) 2000 UNITS CAPS Take 1 capsule by mouth daily.      Hassan Buckler Bark POWD 1 capsule by Does not apply route daily. Sprinkles on food once daily      . Cobalamine Combinations (B-12) 1000-400 MCG SUBL Place 1 tablet under the tongue daily.      . Flaxseed, Linseed, (FLAX SEEDS PO) Take 1 scoop by mouth daily.       . hydrochlorothiazide (HYDRODIURIL) 25 MG tablet  Take 25 mg by mouth every morning.       . Melatonin 3 MG CAPS Take 1 capsule by mouth at bedtime as needed (for sleep).       . Multiple Vitamin (MULTIVITAMIN WITH MINERALS) TABS tablet Take 1 tablet by mouth daily.      . simvastatin (ZOCOR) 20 MG tablet Take 20 mg by mouth at bedtime.      . Turmeric POWD Take 1 scoop by mouth 2 (two) times daily.       No current facility-administered medications for this visit.    Past Medical History  Diagnosis Date  . Diverticulitis   . Arthritis   . Asthma   . Mixed hyperlipidemia   . Essential hypertension, benign   . Tendinitis     Patient reports chronic problem involving her arms and hands  . Chronic back pain     Patient reports lower back disc problems  . Myocardial infarction     Patient reports related to "steroids" in 1985 ([place])  . High blood pressure   . High cholesterol   . Stroke     Social History Ms. Lepage reports that she quit smoking about 28 years ago. Her smoking use included Cigarettes. She has a 24 pack-year smoking history. She has never used smokeless tobacco. Ms. Chihuahua reports that she does not drink alcohol.  Review of Systems  No palpitations or syncope. Other systems reviewed and negative except as outlined.  Physical Examination Filed Vitals:   11/24/13 0959  BP: 138/92  Pulse: 66   Filed Weights   11/24/13 0959  Weight: 175 lb (79.379 kg)    Appears comfortable at rest.  HEENT: Conjunctiva and lids normal, oropharynx clear.  Neck: Supple, no elevated JVP or carotid bruits, no thyromegaly.  Lungs: Clear to auscultation, nonlabored breathing at rest.  Cardiac: Regular rate and rhythm, no S3 or significant systolic murmur, no pericardial rub.  Abdomen: Soft, nontender, bowel sounds present, no guarding or rebound.  Extremities: Trace ankle edema, distal pulses 1-2+.    Problem List and Plan   Abnormal cardiovascular function study As outlined above, clinically stable with prior documentation of  mild RCA distribution ischemia and normal LVEF. Continue observation, on aspirin and statin.  Essential hypertension, benign Keep followup in the free clinic, no changes made to current medical regimen.  Mixed hyperlipidemia Refill provided for simvastatin. Request most recent lab work from the free clinic.    Jonelle Sidle, M.D., F.A.C.C.

## 2013-11-24 NOTE — Assessment & Plan Note (Signed)
Keep followup in the free clinic, no changes made to current medical regimen.

## 2013-11-24 NOTE — Addendum Note (Signed)
Addended by: Marlyn Corporal A on: 11/24/2013 11:24 AM   Modules accepted: Orders

## 2013-11-24 NOTE — Telephone Encounter (Signed)
Pt wanted in chart that she on the forks over knives diet.

## 2013-11-24 NOTE — Assessment & Plan Note (Signed)
Refill provided for simvastatin. Request most recent lab work from the free clinic.

## 2013-11-24 NOTE — Assessment & Plan Note (Signed)
As outlined above, clinically stable with prior documentation of mild RCA distribution ischemia and normal LVEF. Continue observation, on aspirin and statin.

## 2013-11-24 NOTE — Telephone Encounter (Signed)
Received labs gave to Dr. Diona Browner

## 2013-11-24 NOTE — Patient Instructions (Signed)
Your physician wants you to follow-up in: 6 months You will receive a reminder letter in the mail two months in advance. If you don't receive a letter, please call our office to schedule the follow-up appointment.   Your physician recommends that you continue on your current medications as directed. Please refer to the Current Medication list given to you today.     I have refilled your zocor prescription     Thank you for choosing [facility] !

## 2014-01-05 LAB — HM PAP SMEAR: HM Pap smear: NORMAL

## 2014-02-11 ENCOUNTER — Encounter: Payer: Self-pay | Admitting: *Deleted

## 2014-03-18 ENCOUNTER — Encounter: Payer: Self-pay | Admitting: Adult Health

## 2014-03-18 ENCOUNTER — Ambulatory Visit (INDEPENDENT_AMBULATORY_CARE_PROVIDER_SITE_OTHER): Payer: Self-pay | Admitting: Adult Health

## 2014-03-18 VITALS — BP 160/92 | Ht 60.0 in | Wt 175.0 lb

## 2014-03-18 DIAGNOSIS — N950 Postmenopausal bleeding: Secondary | ICD-10-CM

## 2014-03-18 DIAGNOSIS — N852 Hypertrophy of uterus: Secondary | ICD-10-CM

## 2014-03-18 DIAGNOSIS — N3946 Mixed incontinence: Secondary | ICD-10-CM | POA: Insufficient documentation

## 2014-03-18 HISTORY — DX: Hypertrophy of uterus: N85.2

## 2014-03-18 HISTORY — DX: Mixed incontinence: N39.46

## 2014-03-18 HISTORY — DX: Postmenopausal bleeding: N95.0

## 2014-03-18 NOTE — Patient Instructions (Signed)
Postmenopausal Bleeding Postmenopausal bleeding is any bleeding a woman has after she has entered into menopause. Menopause is the end of a woman's fertile years. After menopause, a woman no longer ovulates or has menstrual periods.  Postmenopausal bleeding can be caused by various things. Any type of postmenopausal bleeding, even if it appears to be a typical menstrual period, is concerning. This should be evaluated by your health care provider. Any treatment will depend on the cause of the bleeding. HOME CARE INSTRUCTIONS Monitor your condition for any changes. The following actions may help to alleviate any discomfort you are experiencing:  Avoid the use of tampons and douches as directed by your health care provider.  Change your pads frequently.  Get regular pelvic exams and Pap tests.  Keep all follow-up appointments for diagnostic tests as directed by your health care provider. SEEK MEDICAL CARE IF:   Your bleeding lasts more than 1 week.  You have abdominal pain.  You have bleeding with sexual intercourse. SEEK IMMEDIATE MEDICAL CARE IF:   You have a fever, chills, headache, dizziness, muscle aches, and bleeding.  You have severe pain with bleeding.  You are passing blood clots.  You have bleeding and need more than 1 pad an hour.  You feel faint. MAKE SURE YOU:  Understand these instructions.  Will watch your condition.  Will get help right away if you are not doing well or get worse. Document Released: 06/07/2005 Document Revised: 12/18/2012 Document Reviewed: 09/26/2012 [facility] Patient Information 2015 [employer] [place]. This information is not intended to replace advice given to you by your health care provider. Make sure you discuss any questions you have with your health care provider. [facility] 1[facility]11 [facility] 15 minutes early with full bladder Return to see me in 1 week

## 2014-03-18 NOTE — Progress Notes (Signed)
Subjective:     Patient ID: Lisa [name], female   DOB: 1960/06/03, 54 y.o.   MRN: [id number]  HPI Lisa [name] Medical laboratory scientific officer([name]) is a 54 year old white female, married, [facility].She says she has fibroids and has polyp removed in past.She has some mixed UI also.She was pregnant 2 x and had 7 babies in 1982 vaginally and then in 1987 lost a twin and then delivered the other twin by C section.She had a normal pap 01/05/14.She has history of stroke and MI.  Review of Systems See HPI Reviewed past medical,surgical, social and family history. Reviewed medications and allergies.     Objective:   Physical Exam BP 160/92 mmHg  Ht 5' (1.524 m)  Wt 175 lb (79.379 kg)  BMI 34.18 kg/m2  LMP 12/26/2011   Skin warm and dry.Pelvic: external genitalia is normal in appearance, no lesions, vagina has loss of color and rugae,still has fair moisture, cervix:smooth and bulbous, uterus: enlarged about 16 week size, non tender, no masses felt, adnexa: no masses or tenderness noted.  Assessment:     PMB Enlarged uterus Mixed UI    Plan:     [facility] am Return in 1 week to discuss results and then treatment options Review handout on PMB

## 2014-03-23 ENCOUNTER — Ambulatory Visit (HOSPITAL_COMMUNITY): Admission: RE | Admit: 2014-03-23 | Payer: Self-pay | Source: Ambulatory Visit

## 2014-03-23 ENCOUNTER — Telehealth: Payer: Self-pay | Admitting: Orthopedic Surgery

## 2014-03-23 ENCOUNTER — Ambulatory Visit (HOSPITAL_COMMUNITY): Payer: Self-pay

## 2014-03-23 NOTE — Telephone Encounter (Signed)
Patient called to inquire about appointment for evaluation of back pain, states has history of degenerative disk disease; has been treated in Emergency Room for other issues, and had last been treated, and had MRI, while living in [place].  States [facility]; states does not qualify for Medicaid.  Relayed that Dr Romeo AppleHarrison does does not specialize in  back treatment; also, in regard to discount, is to be reviewed first.  Ph# [phone]

## 2014-03-24 ENCOUNTER — Other Ambulatory Visit (HOSPITAL_COMMUNITY): Payer: Self-pay

## 2014-03-24 NOTE — Telephone Encounter (Signed)
You know the answer

## 2014-03-25 ENCOUNTER — Ambulatory Visit: Payer: Self-pay | Admitting: Adult Health

## 2014-03-25 NOTE — Telephone Encounter (Signed)
Called back to patient, relayed, as noted, that Dr Romeo AppleHarrison is not a back specialist; therefore, no treatment can be offered for this medical issue.  Advised to contact primary care regarding referral to a back/spine specialist.  Patient states [facility] [place], and will do so.

## 2014-03-30 ENCOUNTER — Ambulatory Visit (HOSPITAL_COMMUNITY): Payer: Self-pay

## 2014-03-31 ENCOUNTER — Ambulatory Visit: Payer: Self-pay | Admitting: Adult Health

## 2014-04-06 ENCOUNTER — Other Ambulatory Visit (HOSPITAL_COMMUNITY): Payer: Self-pay

## 2014-04-06 ENCOUNTER — Ambulatory Visit (HOSPITAL_COMMUNITY): Admission: RE | Admit: 2014-04-06 | Payer: Self-pay | Source: Ambulatory Visit

## 2014-04-07 ENCOUNTER — Ambulatory Visit: Payer: Self-pay | Admitting: Adult Health

## 2014-04-10 ENCOUNTER — Other Ambulatory Visit: Payer: Self-pay | Admitting: Adult Health

## 2014-04-10 ENCOUNTER — Ambulatory Visit (HOSPITAL_COMMUNITY)
Admission: RE | Admit: 2014-04-10 | Discharge: 2014-04-10 | Disposition: A | Discharge status: Home / Self Care | Payer: Self-pay | Source: Ambulatory Visit | Attending: Adult Health | Admitting: Adult Health

## 2014-04-10 ENCOUNTER — Ambulatory Visit: Payer: Self-pay | Admitting: Adult Health

## 2014-04-10 DIAGNOSIS — N950 Postmenopausal bleeding: Secondary | ICD-10-CM

## 2014-04-13 ENCOUNTER — Encounter: Payer: Self-pay | Admitting: Adult Health

## 2014-04-13 ENCOUNTER — Ambulatory Visit (INDEPENDENT_AMBULATORY_CARE_PROVIDER_SITE_OTHER): Payer: Self-pay | Admitting: Adult Health

## 2014-04-13 VITALS — BP 134/72 | Ht 60.0 in | Wt 176.0 lb

## 2014-04-13 DIAGNOSIS — N950 Postmenopausal bleeding: Secondary | ICD-10-CM

## 2014-04-13 NOTE — Progress Notes (Addendum)
Subjective:     Patient ID: Lisa [name], female   DOB: 07-Jan-1961, 54 y.o.   MRN: [id number]  HPI [name] is a 54 year old white female in to review US,had PMB, none now.Has chronic back pain with DJD and some discomfort RLQ.Has old medical records with.  Review of Systems See HPI Reviewed past medical,surgical, social and family history. Reviewed medications and allergies.     Objective:   Physical Exam BP 134/72 mmHg  Ht 5' (1.524 m)  Wt 176 lb (79.833 kg)  BMI 34.37 kg/m2  LMP 10/15/2013US reviewed with pt.Pt is aware pain not gyn, could be back or GI.   Uterus  Measurements: 7.8 x 4.3 by 4.8 cm. Diffusely heterogeneous myometrial echotexture demonstrated. Prior C-section scar noted. At least 2 small fibroids are suspected measuring 1.8 cm and 2.0 cm, however other fibroids cannot definitely be excluded.  Endometrium  Thickness: 4 mm. No focal abnormality visualized.  Right ovary  Measurements: 1.3 x 1.1 x 1.0 cm. Normal appearance/no adnexal mass.  Left ovary  Measurements: 2.1 x 1.3 x 1.7 cm. Normal appearance/no adnexal mass.  Other findings  No free fluid.  IMPRESSION: Diffusely heterogeneous myometrial echotexture, with probable small fibroids measuring up to 2.0 cm.  Endometrial thickness measures 4 mm. In the setting of post-menopausal bleeding, this is consistent with a benign etiology such as endometrial atrophy. If bleeding remains unresponsive to hormonal or medical therapy, sonohysterogram should be considered for focal lesion work-up. (Ref: Radiological Reasoning: Algorithmic Workup of Abnormal Vaginal Bleeding with Endovaginal Sonography and Sonohysterography. AJR 2008; 782:N56-21; 191:S68-73).  Normal appearance of both ovaries. No adnexal mass identified. Sex not comfortable at times.  Assessment:     PMB    Plan:    Try luvena and astroglide Call with any further vaginal bleeding Follow up with [facility]

## 2014-04-13 NOTE — Patient Instructions (Addendum)
Call if any more vaginal bleeding Follow up prn Try luvena and astro glide

## 2014-04-15 ENCOUNTER — Telehealth: Payer: Self-pay | Admitting: Adult Health

## 2014-04-15 ENCOUNTER — Encounter: Payer: Self-pay | Admitting: Gastroenterology

## 2014-04-15 DIAGNOSIS — R1031 Right lower quadrant pain: Secondary | ICD-10-CM

## 2014-04-15 NOTE — Telephone Encounter (Signed)
Pt aware of GI referral

## 2014-04-15 NOTE — Telephone Encounter (Signed)
Told her have not been able to find practice that [facility] for her chronic [facility] pain, will refer to Dr Darrick PennaFields for ?GI discomfort

## 2014-05-13 ENCOUNTER — Telehealth: Payer: Self-pay | Admitting: General Practice

## 2014-05-13 ENCOUNTER — Encounter: Payer: Self-pay | Admitting: Gastroenterology

## 2014-05-13 ENCOUNTER — Other Ambulatory Visit: Payer: Self-pay

## 2014-05-13 ENCOUNTER — Ambulatory Visit (INDEPENDENT_AMBULATORY_CARE_PROVIDER_SITE_OTHER): Payer: Self-pay | Admitting: Gastroenterology

## 2014-05-13 VITALS — BP 132/87 | HR 69 | Temp 96.9°F | Ht 60.0 in | Wt 178.0 lb

## 2014-05-13 DIAGNOSIS — R1032 Left lower quadrant pain: Secondary | ICD-10-CM

## 2014-05-13 DIAGNOSIS — R1031 Right lower quadrant pain: Secondary | ICD-10-CM

## 2014-05-13 DIAGNOSIS — K625 Hemorrhage of anus and rectum: Secondary | ICD-10-CM

## 2014-05-13 DIAGNOSIS — R194 Change in bowel habit: Secondary | ICD-10-CM

## 2014-05-13 DIAGNOSIS — G8929 Other chronic pain: Secondary | ICD-10-CM | POA: Insufficient documentation

## 2014-05-13 NOTE — Patient Instructions (Signed)
SUBMIT URINE SAMPLE.  COLONOSCOPY WITHIN NEXT 3-4 WEEKS.  DRINK WATER TO KEEP YOUR URINE LIGHT YELLOW.  EAT FIBER. AVOID ITEMS THAT CAUSE BLOATING & GAS. SEE INFO BELOW.  TRY LINZESS 145 OR 290 WITH MEALS. CALL FOR A PRESCRIPTION. IT MAY CAUSE EXPLOSIVE DIARRHEA.  FOLLOW UP IN 4 MOS.   High-Fiber Diet A high-fiber diet changes your normal diet to include more whole grains, legumes, fruits, and vegetables. Changes in the diet involve replacing refined carbohydrates with unrefined foods. The calorie level of the diet is essentially unchanged. The Dietary Reference Intake (recommended amount) for adult males is 38 grams per day. For adult females, it is 25 grams per day. Pregnant and lactating women should consume 28 grams of fiber per day. Fiber is the intact part of a plant that is not broken down during digestion. Functional fiber is fiber that has been isolated from the plant to provide a beneficial effect in the body. PURPOSE  Increase stool bulk.   Ease and regulate bowel movements.   Lower cholesterol.  INDICATIONS THAT YOU NEED MORE FIBER  Constipation and hemorrhoids.   Uncomplicated diverticulosis (intestine condition) and irritable bowel syndrome.   Weight management.   As a protective measure against hardening of the arteries (atherosclerosis), diabetes, and cancer.   GUIDELINES FOR INCREASING FIBER IN THE DIET  Start adding fiber to the diet slowly. A gradual increase of about 5 more grams (2 slices of whole-wheat bread, 2 servings of most fruits or vegetables, or 1 bowl of high-fiber cereal) per day is best. Too rapid an increase in fiber may result in constipation, flatulence, and bloating.   Drink enough water and fluids to keep your urine clear or pale yellow. Water, juice, or caffeine-free drinks are recommended. Not drinking enough fluid may cause constipation.   Eat a variety of high-fiber foods rather than one type of fiber.   Try to increase your intake of  fiber through using high-fiber foods rather than fiber pills or supplements that contain small amounts of fiber.   The goal is to change the types of food eaten. Do not supplement your present diet with high-fiber foods, but replace foods in your present diet.  INCLUDE A VARIETY OF FIBER SOURCES  Replace refined and processed grains with whole grains, canned fruits with fresh fruits, and incorporate other fiber sources. White rice, white breads, and most bakery goods contain little or no fiber.   Brown whole-grain rice, buckwheat oats, and many fruits and vegetables are all good sources of fiber. These include: broccoli, Brussels sprouts, cabbage, cauliflower, beets, sweet potatoes, white potatoes (skin on), carrots, tomatoes, eggplant, squash, berries, fresh fruits, and dried fruits.   Cereals appear to be the richest source of fiber. Cereal fiber is found in whole grains and bran. Bran is the fiber-rich outer coat of cereal grain, which is largely removed in refining. In whole-grain cereals, the bran remains. In breakfast cereals, the largest amount of fiber is found in those with "bran" in their names. The fiber content is sometimes indicated on the label.   You may need to include additional fruits and vegetables each day.   In baking, for 1 cup white flour, you may use the following substitutions:   1 cup whole-wheat flour minus 2 tablespoons.   1/2 cup white flour plus 1/2 cup whole-wheat flour.

## 2014-05-13 NOTE — Progress Notes (Signed)
ON RECALL LIST  °

## 2014-05-13 NOTE — Progress Notes (Signed)
 Subjective:    Patient ID: Lisa [name], female    DOB: 08/16/1960, 54 y.o.   MRN: [id number]  McElroy, Shannon G, PA-C  HPI CHANGE IN BOWEL IN HABITS: HAVING TROUBLE WITH CONSTIPATION AND RECTAL BLEEDING. HAVING PAIN IN MID to lower ABD. SX FOR OFF AND ON FOR 2 MOS. NOW BETTER. CHANGED DIET AND ADDED FLAX SEED OIL. REDUCING MEAT AND STAYING AWAY FROM PROCESSED MEAT. PART NATIVE AMERICAN AND TRYING DIET BASED ON HER ETHNIC BACKGROUND. CURRENTLY TRYING TO BECOME VEGAN AND OFF PROCESSED FOOD FOR 2 YEARS. ORIGINALLY FROM [place] , [place]. IN AREA SINCE OCT 2013. TRYING TO LOSE WEIGHT. WITH CONSTIPATION -SUBJECTIVE FEVER OR CHILLS, NAUSEA. LAST TCS: 20129? [place] , [place]). ONLY HAS BRBPR-ON TISSUE/BOWL. FEELS CONSTIPATED WHEN SHE EATS MEAT. DRINKS WATER AND HERBAL TEA(DECAF). STICKY STOOLS WITH INCREASED STRESS LEVEL AND DIET "GOES OUT OF WACK." TRIGGERS FOR ABD PAIN: WAITS TO HAVE A BM, CONSTIPATION, PROCESSED FOOD. EVENING IT'S THE WORSE. HEARTBURN: CONTROLLED WITH DIET. NEVER Dx WITH IBS OR IC. CAN'T PLAY FLUTE ANYMORE.  PT DENIES FEVER, CHILLS,  HEMATEMESIS, vomiting, melena, diarrhea, CHEST PAIN, SHORTNESS OF BREATH,  , problems swallowing, problems with sedation, OR heartburn or indigestion.  Past Medical History  Diagnosis Date  . Diverticulitis   . Arthritis   . Asthma   . Mixed hyperlipidemia   . Essential hypertension, benign   . Tendinitis     Patient reports chronic problem involving her arms and hands  . Chronic back pain     Patient reports lower back disc problems  . Myocardial infarction     Patient reports related to "steroids" in 1985 ([place])  . High blood pressure   . High cholesterol   . Stroke   . Degenerative lumbar disc   . Bursitis   . PMB (postmenopausal bleeding) 03/18/2014  . Enlarged uterus 03/18/2014  . Mixed stress and urge urinary incontinence 03/18/2014  . GERD (gastroesophageal reflux disease)    Past Surgical History  Procedure Laterality Date  .  Cesarean section    . Tonsillectomy    . Adnoids      Allergies  Allergen Reactions  . Metoprolol Nausea And Vomiting    Dizziness Elevated BP  . Morphine And Related Hives  . Naproxen Other (See Comments)    Lightheadedness  . Tramadol Nausea And Vomiting  . Ibuprofen Rash  . Penicillins Rash    Current Outpatient Prescriptions  Medication Sig Dispense Refill  . albuterol (PROVENTIL HFA;VENTOLIN HFA) 108 (90 BASE) MCG/ACT inhaler Inhale 2 puffs into the lungs every 6 (six) hours as needed for wheezing.    . aspirin EC 81 MG tablet Take 81 mg by mouth daily.     . B-Complex TABS Take 1 tablet by mouth daily.    . bisacodyl (DULCOLAX) 10 MG suppository Place 10 mg rectally as needed for moderate constipation.    . Calcium Carb-Vit D-Soy Isoflav (SOY FORMULA PO) Take by mouth 2 (two) times daily.    . Cholecalciferol (VITAMIN D) 2000 UNITS CAPS Take 1 capsule by mouth daily.    . Cinnamon Bark POWD 1 capsule by Does not apply route daily. Sprinkles on food once daily    . Cobalamine Combinations (B-12) 1000-400 MCG SUBL Place 1 tablet under the tongue daily.    . DOCUSATE SODIUM PO Take by mouth as needed.    . Flaxseed, Linseed, (FLAX SEEDS PO) Take 1 scoop by mouth daily. Mixes it with food BID.    .   hydrochlorothiazide (HYDRODIURIL) 25 MG tablet Take 25 mg by mouth every morning.     . Multiple Vitamin (MULTIVITAMIN WITH MINERALS) TABS tablet Take 1 tablet by mouth daily.    . simvastatin (ZOCOR) 20 MG tablet Take 1 tablet (20 mg total) by mouth at bedtime.    . Turmeric POWD Take 10 mg by mouth 2 (two) times daily.      Family History  Problem Relation Age of Onset  . Adopted: Yes  . Stroke Mother   . Other Mother     aneursym  . Hypertension Mother   . Other Maternal Uncle     stenosis  . Arthritis Maternal Uncle   . Angina Son   . ADD / ADHD Son   . Other Son     food allergies  . Blindness Daughter     from birth; brain damage  . Drug abuse Sister   . Heart  attack Paternal Aunt   . Alcohol abuse Cousin   . Drug abuse Cousin     History  Substance Use Topics  . Smoking status: Former Smoker -- 2.00 packs/day for 12 years    Types: Cigarettes    Quit date: 03/13/1985  . Smokeless tobacco: Never Used  . Alcohol Use: No   Review of Systems PER HPI OTHERWISE ALL SYSTEMS ARE NEGATIVE. Bother works at [employer].     Objective:   Physical Exam  Constitutional: She is oriented to person, place, and time. She appears well-developed and well-nourished. No distress.  HENT:  Head: Normocephalic and atraumatic.  Mouth/Throat: Oropharynx is clear and moist. No oropharyngeal exudate.  Eyes: Pupils are equal, round, and reactive to light. No scleral icterus.  Neck: Normal range of motion. Neck supple.  Cardiovascular: Normal rate, regular rhythm and normal heart sounds.   Pulmonary/Chest: Effort normal and breath sounds normal. No respiratory distress.  Abdominal: Soft. Bowel sounds are normal. She exhibits no distension. There is tenderness. There is no rebound and no guarding.  MILD BLQs TTP  Musculoskeletal: She exhibits no edema.  Lymphadenopathy:    She has no cervical adenopathy.  Neurological: She is alert and oriented to person, place, and time.  NO FOCAL DEFICITS  Psychiatric:  SLIGHTLY ANXIOUS MOOD, FLAT AFFECT   Vitals reviewed.         Assessment & Plan:   

## 2014-05-13 NOTE — Telephone Encounter (Signed)
I spoke with the patient and she would like to reschedule her tcs appt that is scheduled with [facility] on 3/21.  Please call her at  [phone].

## 2014-05-13 NOTE — Progress Notes (Signed)
CC'ED TO PCP 

## 2014-05-13 NOTE — Assessment & Plan Note (Addendum)
ASSOCIATED WITH CHANGE IN BOWEL HABITS MOST LIKELY DUE TO IBS-C IN PT WITH SIGNIFICANT PSYCHOSOCIAL STRESSORS, LESS LIKELY COLON CA OR CYSTITIS.  SUBMIT UA/MICRO. TCS WITHIN NEXT 3-4 WEEKS. SUPREP SAMPLE. DISCUSSED PROCEDURE, BENEFITS, AND RISKS OF PROCEDURE. OBTAIN TCS FROM [place]. DRINK WATER EAT FIBER. AVOID ITEMS THAT CAUSE BLOATING & GAS. ADD LINZESS. MED SIDE EFFECTS DISCUSSED. FOLLOW UP IN 4 MOS.

## 2014-05-14 LAB — URINALYSIS, ROUTINE W REFLEX MICROSCOPIC
BILIRUBIN URINE: NEGATIVE
Glucose, UA: NEGATIVE mg/dL
Hgb urine dipstick: NEGATIVE
Ketones, ur: NEGATIVE mg/dL
Leukocytes, UA: NEGATIVE
Nitrite: NEGATIVE
PH: 5 (ref 5.0–8.0)
Protein, ur: NEGATIVE mg/dL
SPECIFIC GRAVITY, URINE: 1.015 (ref 1.005–1.030)
Urobilinogen, UA: 0.2 mg/dL (ref 0.0–1.0)

## 2014-05-14 NOTE — Telephone Encounter (Signed)
PLEASE CALL PT. HER URINE TEST IS NORMAL.

## 2014-05-14 NOTE — Telephone Encounter (Signed)
PT HAS BEEN RESCHEDULED FOR 06/04/14 @ 1:00. NEW INSTRUCTIONS ARE IN THE MAIL

## 2014-05-14 NOTE — Telephone Encounter (Signed)
Pt is aware of urine result.

## 2014-06-01 ENCOUNTER — Ambulatory Visit: Payer: Self-pay | Admitting: Cardiology

## 2014-06-04 ENCOUNTER — Ambulatory Visit (HOSPITAL_COMMUNITY)
Admission: RE | Admit: 2014-06-04 | Discharge: 2014-06-04 | Disposition: A | Discharge status: Home / Self Care | Payer: Self-pay | Source: Ambulatory Visit | Attending: Gastroenterology | Admitting: Gastroenterology

## 2014-06-04 ENCOUNTER — Encounter (HOSPITAL_COMMUNITY): Payer: Self-pay | Admitting: *Deleted

## 2014-06-04 ENCOUNTER — Encounter (HOSPITAL_COMMUNITY)
Admission: RE | Disposition: A | Discharge status: Home / Self Care | Payer: Self-pay | Source: Ambulatory Visit | Attending: Gastroenterology

## 2014-06-04 DIAGNOSIS — Z8673 Personal history of transient ischemic attack (TIA), and cerebral infarction without residual deficits: Secondary | ICD-10-CM | POA: Insufficient documentation

## 2014-06-04 DIAGNOSIS — I10 Essential (primary) hypertension: Secondary | ICD-10-CM | POA: Insufficient documentation

## 2014-06-04 DIAGNOSIS — Z7982 Long term (current) use of aspirin: Secondary | ICD-10-CM | POA: Insufficient documentation

## 2014-06-04 DIAGNOSIS — E782 Mixed hyperlipidemia: Secondary | ICD-10-CM | POA: Insufficient documentation

## 2014-06-04 DIAGNOSIS — N3946 Mixed incontinence: Secondary | ICD-10-CM | POA: Insufficient documentation

## 2014-06-04 DIAGNOSIS — K219 Gastro-esophageal reflux disease without esophagitis: Secondary | ICD-10-CM | POA: Insufficient documentation

## 2014-06-04 DIAGNOSIS — K625 Hemorrhage of anus and rectum: Secondary | ICD-10-CM | POA: Insufficient documentation

## 2014-06-04 DIAGNOSIS — J45909 Unspecified asthma, uncomplicated: Secondary | ICD-10-CM | POA: Insufficient documentation

## 2014-06-04 DIAGNOSIS — K6389 Other specified diseases of intestine: Secondary | ICD-10-CM | POA: Insufficient documentation

## 2014-06-04 DIAGNOSIS — E780 Pure hypercholesterolemia: Secondary | ICD-10-CM | POA: Insufficient documentation

## 2014-06-04 DIAGNOSIS — K5730 Diverticulosis of large intestine without perforation or abscess without bleeding: Secondary | ICD-10-CM | POA: Insufficient documentation

## 2014-06-04 DIAGNOSIS — R194 Change in bowel habit: Secondary | ICD-10-CM

## 2014-06-04 DIAGNOSIS — K648 Other hemorrhoids: Secondary | ICD-10-CM | POA: Insufficient documentation

## 2014-06-04 DIAGNOSIS — Z880 Allergy status to penicillin: Secondary | ICD-10-CM | POA: Insufficient documentation

## 2014-06-04 DIAGNOSIS — Z886 Allergy status to analgesic agent status: Secondary | ICD-10-CM | POA: Insufficient documentation

## 2014-06-04 DIAGNOSIS — I252 Old myocardial infarction: Secondary | ICD-10-CM | POA: Insufficient documentation

## 2014-06-04 DIAGNOSIS — Z87891 Personal history of nicotine dependence: Secondary | ICD-10-CM | POA: Insufficient documentation

## 2014-06-04 DIAGNOSIS — Z888 Allergy status to other drugs, medicaments and biological substances status: Secondary | ICD-10-CM | POA: Insufficient documentation

## 2014-06-04 DIAGNOSIS — K621 Rectal polyp: Secondary | ICD-10-CM

## 2014-06-04 HISTORY — PX: COLONOSCOPY: SHX5424

## 2014-06-04 SURGERY — COLONOSCOPY
Anesthesia: Moderate Sedation

## 2014-06-04 MED ORDER — SODIUM CHLORIDE 0.9 % IJ SOLN
INTRAMUSCULAR | Status: AC
  Filled 2014-06-04: qty 3

## 2014-06-04 MED ORDER — MEPERIDINE HCL 100 MG/ML IJ SOLN
INTRAMUSCULAR | Status: AC
  Filled 2014-06-04: qty 2

## 2014-06-04 MED ORDER — SODIUM CHLORIDE 0.9 % IV SOLN
INTRAVENOUS | Status: DC
  Administered 2014-06-04: 12:00:00 via INTRAVENOUS

## 2014-06-04 MED ORDER — MIDAZOLAM HCL 5 MG/5ML IJ SOLN
INTRAMUSCULAR | Status: DC | PRN
  Administered 2014-06-04 (×2): 1 mg via INTRAVENOUS
  Administered 2014-06-04: 2 mg via INTRAVENOUS

## 2014-06-04 MED ORDER — PROMETHAZINE HCL 25 MG/ML IJ SOLN
INTRAMUSCULAR | Status: AC
  Filled 2014-06-04: qty 1

## 2014-06-04 MED ORDER — MIDAZOLAM HCL 5 MG/5ML IJ SOLN
INTRAMUSCULAR | Status: AC
  Filled 2014-06-04: qty 10

## 2014-06-04 MED ORDER — MEPERIDINE HCL 100 MG/ML IJ SOLN
INTRAMUSCULAR | Status: DC | PRN
  Administered 2014-06-04: 50 mg via INTRAVENOUS
  Administered 2014-06-04: 25 mg via INTRAVENOUS

## 2014-06-04 MED ORDER — PROMETHAZINE HCL 25 MG/ML IJ SOLN
12.5000 mg | Freq: Once | INTRAMUSCULAR | Status: AC
  Administered 2014-06-04: 12.5 mg via INTRAVENOUS

## 2014-06-04 MED ORDER — STERILE WATER FOR IRRIGATION IR SOLN
Status: DC | PRN
  Administered 2014-06-04: 12:00:00

## 2014-06-04 NOTE — Op Note (Signed)
[facility] [address] [place], [id number]   COLONOSCOPY PROCEDURE REPORT  PATIENT: Lisa [name], Lisa [name]  MR#: #[id number] BIRTHDATE: [birth year] , 54  yrs. old GENDER: female ENDOSCOPIST: West BaliSandi L Analee Montee, MD REFERRED XB:[id number]:Shannon McElroy, PA-C PROCEDURE DATE:  06/04/2014 PROCEDURE:   Colonoscopy with cold biopsy polypectomy INDICATIONS:change in bowel habits. MEDICATIONS: Promethazine (Phenergan) 12.5 mg IV, Demerol 75 mg IV, and Versed 4 mg IV  DESCRIPTION OF PROCEDURE:    Physical exam was performed.  Informed consent was obtained from the patient after explaining the benefits, risks, and alternatives to procedure.  The patient was connected to monitor and placed in left lateral position. Continuous oxygen was provided by nasal cannula and IV medicine administered through an indwelling cannula.  After administration of sedation and rectal exam, the patients rectum was intubated and the EC-[id number] ([id number])  colonoscope was advanced under direct visualization to the cecum.  The scope was removed slowly by carefully examining the color, texture, anatomy, and integrity mucosa on the way out.  The patient was recovered in endoscopy and discharged home in satisfactory condition.    COLON FINDINGS: There was moderate diverticulosis noted in the sigmoid colon, descending colon, and transverse colon with associated angulation, tortuosity, muscular hypertrophy and colonic narrowing.  , Two sessile polyps ranging from 2 to 5mm in size were found in the rectum.  A polypectomy was performed with cold forceps.  , and Large external and SMALL  internal hemorrhoids were found.  PREP QUALITY: good.  CECAL W/D TIME: 14       minutes COMPLICATIONS: None  ENDOSCOPIC IMPRESSION: 1.   Moderate diverticulosis in the sigmoid colon, descending colon, and transverse colon 2.   Two COLON polyps REMOVED 3.   Large external and SMALL internal hemorrhoids  RECOMMENDATIONS: FOLLOW A  HIGH FIBER DIET. AWAIT BIOPSY. Next colonoscopy in 5-10 years.  CONSIDER AN OVERTUBE.     _______________________________ eSignedWest [place]:  Franny Selvage L Davante Gerke, MD 06/04/2014 1:25 PM    CPT CODES: ICD CODES:  The ICD and CPT codes recommended by this software are interpretations from the data that the clinical staff has captured with the software.  The verification of the translation of this report to the ICD and CPT codes and modifiers is the sole responsibility of the health care institution and practicing physician where this report was generated.  [employer] will not be held responsible for the validity of the ICD and CPT codes included on this report.  AMA assumes no liability for data contained or not contained herein. CPT is a Publishing rights managerregistered trademark of the [employer].

## 2014-06-04 NOTE — H&P (View-Only) (Signed)
Subjective:    Patient ID: Lisa [name], female    DOB: [birth year], 54 y.o.   MRN: [id number]  Willow OraMcElroy, Shannon G, PA-C  HPI CHANGE IN BOWEL IN HABITS: HAVING TROUBLE WITH CONSTIPATION AND RECTAL BLEEDING. HAVING PAIN IN MID to lower ABD. SX FOR OFF AND ON FOR 2 MOS. NOW BETTER. CHANGED DIET AND ADDED FLAX SEED OIL. REDUCING MEAT AND STAYING AWAY FROM PROCESSED MEAT. PART NATIVE AMERICAN AND TRYING DIET BASED ON HER ETHNIC BACKGROUND. CURRENTLY TRYING TO BECOME VEGAN AND OFF PROCESSED FOOD FOR 2 YEARS. ORIGINALLY FROM [place] , [place]. IN AREA SINCE OCT 2013. TRYING TO LOSE WEIGHT. WITH CONSTIPATION -SUBJECTIVE FEVER OR CHILLS, NAUSEA. LAST TCS: 20129? [place] , [place]). ONLY HAS BRBPR-ON TISSUE/BOWL. FEELS CONSTIPATED WHEN SHE EATS MEAT. DRINKS WATER AND HERBAL TEA(DECAF). STICKY STOOLS WITH INCREASED STRESS LEVEL AND DIET "GOES OUT OF WACK." TRIGGERS FOR ABD PAIN: WAITS TO HAVE A BM, CONSTIPATION, PROCESSED FOOD. EVENING IT'S THE WORSE. HEARTBURN: CONTROLLED WITH DIET. NEVER Dx WITH IBS OR IC. CAN'T PLAY FLUTE ANYMORE.  PT DENIES FEVER, CHILLS,  HEMATEMESIS, vomiting, melena, diarrhea, CHEST PAIN, SHORTNESS OF BREATH,  , problems swallowing, problems with sedation, OR heartburn or indigestion.  Past Medical History  Diagnosis Date  . Diverticulitis   . Arthritis   . Asthma   . Mixed hyperlipidemia   . Essential hypertension, benign   . Tendinitis     Patient reports chronic problem involving her arms and hands  . Chronic back pain     Patient reports lower back disc problems  . Myocardial infarction     Patient reports related to "steroids" in 1985 ([place])  . High blood pressure   . High cholesterol   . Stroke   . Degenerative lumbar disc   . Bursitis   . PMB (postmenopausal bleeding) 03/18/2014  . Enlarged uterus 03/18/2014  . Mixed stress and urge urinary incontinence 03/18/2014  . GERD (gastroesophageal reflux disease)    Past Surgical History  Procedure Laterality Date  .  Cesarean section    . Tonsillectomy    . Adnoids      Allergies  Allergen Reactions  . Metoprolol Nausea And Vomiting    Dizziness Elevated BP  . Morphine And Related Hives  . Naproxen Other (See Comments)    Lightheadedness  . Tramadol Nausea And Vomiting  . Ibuprofen Rash  . Penicillins Rash    Current Outpatient Prescriptions  Medication Sig Dispense Refill  . albuterol (PROVENTIL HFA;VENTOLIN HFA) 108 (90 BASE) MCG/ACT inhaler Inhale 2 puffs into the lungs every 6 (six) hours as needed for wheezing.    Marland Kitchen. aspirin EC 81 MG tablet Take 81 mg by mouth daily.     Marland Kitchen. B-Complex TABS Take 1 tablet by mouth daily.    . bisacodyl (DULCOLAX) 10 MG suppository Place 10 mg rectally as needed for moderate constipation.    . Calcium Carb-Vit D-Soy Isoflav (SOY FORMULA PO) Take by mouth 2 (two) times daily.    . Cholecalciferol (VITAMIN D) 2000 UNITS CAPS Take 1 capsule by mouth daily.    Hassan Buckler. Cinnamon Bark POWD 1 capsule by Does not apply route daily. Sprinkles on food once daily    . Cobalamine Combinations (B-12) 1000-400 MCG SUBL Place 1 tablet under the tongue daily.    Marland Kitchen. DOCUSATE SODIUM PO Take by mouth as needed.    . Flaxseed, Linseed, (FLAX SEEDS PO) Take 1 scoop by mouth daily. Mixes it with food BID.    .Marland Kitchen  hydrochlorothiazide (HYDRODIURIL) 25 MG tablet Take 25 mg by mouth every morning.     . Multiple Vitamin (MULTIVITAMIN WITH MINERALS) TABS tablet Take 1 tablet by mouth daily.    . simvastatin (ZOCOR) 20 MG tablet Take 1 tablet (20 mg total) by mouth at bedtime.    . Turmeric POWD Take 10 mg by mouth 2 (two) times daily.      Family History  Problem Relation Age of Onset  . Adopted: Yes  . Stroke Mother   . Other Mother     aneursym  . Hypertension Mother   . Other Maternal Uncle     stenosis  . Arthritis Maternal Uncle   . Angina Son   . ADD / ADHD Son   . Other Son     food allergies  . Blindness Daughter     from birth; brain damage  . Drug abuse Sister   . Heart  attack Paternal Aunt   . Alcohol abuse Cousin   . Drug abuse Cousin     History  Substance Use Topics  . Smoking status: Former Smoker -- 2.00 packs/day for 12 years    Types: Cigarettes    Quit date: 03/13/1985  . Smokeless tobacco: Never Used  . Alcohol Use: No   Review of Systems PER HPI OTHERWISE ALL SYSTEMS ARE NEGATIVE. Bother works at [employer].     Objective:   Physical Exam  Constitutional: She is oriented to person, place, and time. She appears well-developed and well-nourished. No distress.  HENT:  Head: Normocephalic and atraumatic.  Mouth/Throat: Oropharynx is clear and moist. No oropharyngeal exudate.  Eyes: Pupils are equal, round, and reactive to light. No scleral icterus.  Neck: Normal range of motion. Neck supple.  Cardiovascular: Normal rate, regular rhythm and normal heart sounds.   Pulmonary/Chest: Effort normal and breath sounds normal. No respiratory distress.  Abdominal: Soft. Bowel sounds are normal. She exhibits no distension. There is tenderness. There is no rebound and no guarding.  MILD BLQs TTP  Musculoskeletal: She exhibits no edema.  Lymphadenopathy:    She has no cervical adenopathy.  Neurological: She is alert and oriented to person, place, and time.  NO FOCAL DEFICITS  Psychiatric:  SLIGHTLY ANXIOUS MOOD, FLAT AFFECT   Vitals reviewed.         Assessment & Plan:

## 2014-06-04 NOTE — Interval H&P Note (Signed)
History and Physical Interval Note:  06/04/2014 12:15 PM  Lisa [name]  has presented today for surgery, with the diagnosis of RECTAL BLEEDING/CHANGE IN BOWEL HABITS  The various methods of treatment have been discussed with the patient and family. After consideration of risks, benefits and other options for treatment, the patient has consented to  Procedure(s) with comments: COLONOSCOPY (N/A) - 1030 - moved to 3/24 @ 1:00 as a surgical intervention .  The patient's history has been reviewed, patient examined, no change in status, stable for surgery.  I have reviewed the patient's chart and labs.  Questions were answered to the patient's satisfaction.     Eaton CorporationSandi Fields

## 2014-06-04 NOTE — Discharge Instructions (Signed)
You had 2 polyps emoved. You have small internal and LARGE EXTERNAL hemorrhoids and diverticulosis IN YOUR RIGHT AND LEFT COLON.   DRINK WATER TO KEEP YOUR URINE LIGHT YELLOW.  FOLLOW A HIGH FIBER DIET. AVOID ITEMS THAT CAUSE BLOATING. SEE INFO BELOW.  YOUR BIOPSY RESULTS WILL BE AVAILABLE IN MY CHART AFTER MAR 29 OR MY OFFICE WILL CONTACT YOU IN 10-14 DAYS WITH YOUR RESULTS.   Next colonoscopy in 5-10 years.   Colonoscopy Care After Read the instructions outlined below and refer to this sheet in the next week. These discharge instructions provide you with general information on caring for yourself after you leave the hospital. While your treatment has been planned according to the most current medical practices available, unavoidable complications occasionally occur. If you have any problems or questions after discharge, call DR. Johnta Couts, [phone].  ACTIVITY  You may resume your regular activity, but move at a slower pace for the next 24 hours.   Take frequent rest periods for the next 24 hours.   Walking will help get rid of the air and reduce the bloated feeling in your belly (abdomen).   No driving for 24 hours (because of the medicine (anesthesia) used during the test).   You may shower.   Do not sign any important legal documents or operate any machinery for 24 hours (because of the anesthesia used during the test).    NUTRITION  Drink plenty of fluids.   You may resume your normal diet as instructed by your doctor.   Begin with a light meal and progress to your normal diet. Heavy or fried foods are harder to digest and may make you feel sick to your stomach (nauseated).   Avoid alcoholic beverages for 24 hours or as instructed.    MEDICATIONS  You may resume your normal medications.   WHAT YOU CAN EXPECT TODAY  Some feelings of bloating in the abdomen.   Passage of more gas than usual.   Spotting of blood in your stool or on the toilet paper  .  IF  YOU HAD POLYPS REMOVED DURING THE COLONOSCOPY:  Eat a soft diet IF YOU HAVE NAUSEA, BLOATING, ABDOMINAL PAIN, OR VOMITING.    FINDING OUT THE RESULTS OF YOUR TEST Not all test results are available during your visit. DR. Darrick Penna WILL CALL YOU WITHIN 14 DAYS OF YOUR PROCEDUE WITH YOUR RESULTS. Do not assume everything is normal if you have not heard from DR. Jayani Rozman, CALL HER OFFICE AT [phone].  SEEK IMMEDIATE MEDICAL ATTENTION AND CALL THE OFFICE: [phone] IF:  You have more than a spotting of blood in your stool.   Your belly is swollen (abdominal distention).   You are nauseated or vomiting.   You have a temperature over 101F.   You have abdominal pain or discomfort that is severe or gets worse throughout the day.  Polyps, Colon  A polyp is extra tissue that grows inside your body. Colon polyps grow in the large intestine. The large intestine, also called the colon, is part of your digestive system. It is a long, hollow tube at the end of your digestive tract where your body makes and stores stool. Most polyps are not dangerous. They are benign. This means they are not cancerous. But over time, some types of polyps can turn into cancer. Polyps that are smaller than a pea are usually not harmful. But larger polyps could someday become or may already be cancerous. To be safe, doctors remove all polyps and  test them.   WHO GETS POLYPS? Anyone can get polyps, but certain people are more likely than others. You may have a greater chance of getting polyps if:  You are over 50.   You have had polyps before.   Someone in your family has had polyps.   Someone in your family has had cancer of the large intestine.   Find out if someone in your family has had polyps. You may also be more likely to get polyps if you:   Eat a lot of fatty foods   Smoke   Drink alcohol   Do not exercise  Eat too much   TREATMENT  The caregiver will remove the polyp during sigmoidoscopy or  colonoscopy.  PREVENTION There is not one sure way to prevent polyps. You might be able to lower your risk of getting them if you:  Eat more fruits and vegetables and less fatty food.   Do not smoke.   Avoid alcohol.   Exercise every day.   Lose weight if you are overweight.   Eating more calcium and folate can also lower your risk of getting polyps. Some foods that are rich in calcium are milk, cheese, and broccoli. Some foods that are rich in folate are chickpeas, kidney beans, and spinach.   High-Fiber Diet A high-fiber diet changes your normal diet to include more whole grains, legumes, fruits, and vegetables. Changes in the diet involve replacing refined carbohydrates with unrefined foods. The calorie level of the diet is essentially unchanged. The Dietary Reference Intake (recommended amount) for adult males is 38 grams per day. For adult females, it is 25 grams per day. Pregnant and lactating women should consume 28 grams of fiber per day. Fiber is the intact part of a plant that is not broken down during digestion. Functional fiber is fiber that has been isolated from the plant to provide a beneficial effect in the body. PURPOSE  Increase stool bulk.   Ease and regulate bowel movements.   Lower cholesterol.  INDICATIONS THAT YOU NEED MORE FIBER  Constipation and hemorrhoids.   Uncomplicated diverticulosis (intestine condition) and irritable bowel syndrome.   Weight management.   As a protective measure against hardening of the arteries (atherosclerosis), diabetes, and cancer.   GUIDELINES FOR INCREASING FIBER IN THE DIET  Start adding fiber to the diet slowly. A gradual increase of about 5 more grams (2 slices of whole-wheat bread, 2 servings of most fruits or vegetables, or 1 bowl of high-fiber cereal) per day is best. Too rapid an increase in fiber may result in constipation, flatulence, and bloating.   Drink enough water and fluids to keep your urine clear or pale  yellow. Water, juice, or caffeine-free drinks are recommended. Not drinking enough fluid may cause constipation.   Eat a variety of high-fiber foods rather than one type of fiber.   Try to increase your intake of fiber through using high-fiber foods rather than fiber pills or supplements that contain small amounts of fiber.   The goal is to change the types of food eaten. Do not supplement your present diet with high-fiber foods, but replace foods in your present diet.  INCLUDE A VARIETY OF FIBER SOURCES  Replace refined and processed grains with whole grains, canned fruits with fresh fruits, and incorporate other fiber sources. White rice, white breads, and most bakery goods contain little or no fiber.   Brown whole-grain rice, buckwheat oats, and many fruits and vegetables are all good sources of fiber.  These include: broccoli, Brussels sprouts, cabbage, cauliflower, beets, sweet potatoes, white potatoes (skin on), carrots, tomatoes, eggplant, squash, berries, fresh fruits, and dried fruits.   Cereals appear to be the richest source of fiber. Cereal fiber is found in whole grains and bran. Bran is the fiber-rich outer coat of cereal grain, which is largely removed in refining. In whole-grain cereals, the bran remains. In breakfast cereals, the largest amount of fiber is found in those with "bran" in their names. The fiber content is sometimes indicated on the label.   You may need to include additional fruits and vegetables each day.   In baking, for 1 cup white flour, you may use the following substitutions:   1 cup whole-wheat flour minus 2 tablespoons.   1/2 cup white flour plus 1/2 cup whole-wheat flour.   Diverticulosis Diverticulosis is a common condition that develops when small pouches (diverticula) form in the wall of the colon. The risk of diverticulosis increases with age. It happens more often in people who eat a low-fiber diet. Most individuals with diverticulosis have no  symptoms. Those individuals with symptoms usually experience belly (abdominal) pain, constipation, or loose stools (diarrhea).  HOME CARE INSTRUCTIONS  Increase the amount of fiber in your diet as directed by your caregiver or dietician. This may reduce symptoms of diverticulosis.   Drink at least 6 to 8 glasses of water each day to prevent constipation.   Try not to strain when you have a bowel movement.   Avoiding nuts and seeds to prevent complications is still an uncertain benefit.       FOODS HAVING HIGH FIBER CONTENT INCLUDE:  Fruits. Apple, peach, pear, tangerine, raisins, prunes.   Vegetables. Brussels sprouts, asparagus, broccoli, cabbage, carrot, cauliflower, romaine lettuce, spinach, summer squash, tomato, winter squash, zucchini.   Starchy Vegetables. Baked beans, kidney beans, lima beans, split peas, lentils, potatoes (with skin).   Grains. Whole wheat bread, brown rice, bran flake cereal, plain oatmeal, white rice, shredded wheat, bran muffins.    SEEK IMMEDIATE MEDICAL CARE IF:  You develop increasing pain or severe bloating.   You have an oral temperature above 101F.   You develop vomiting or bowel movements that are bloody or black.   Hemorrhoids Hemorrhoids are dilated (enlarged) veins around the rectum. Sometimes clots will form in the veins. This makes them swollen and painful. These are called thrombosed hemorrhoids. Causes of hemorrhoids include:  Constipation.   Straining to have a bowel movement.   HEAVY LIFTING HOME CARE INSTRUCTIONS  Eat a well balanced diet and drink 6 to 8 glasses of water every day to avoid constipation. You may also use a bulk laxative.   Avoid straining to have bowel movements.   Keep anal area dry and clean.   Do not use a donut shaped pillow or sit on the toilet for long periods. This increases blood pooling and pain.   Move your bowels when your body has the urge; this will require less straining and will decrease  pain and pressure.

## 2014-06-08 ENCOUNTER — Encounter (HOSPITAL_COMMUNITY): Payer: Self-pay | Admitting: Gastroenterology

## 2014-06-15 ENCOUNTER — Telehealth: Payer: Self-pay

## 2014-06-15 NOTE — Telephone Encounter (Signed)
Pt is calling to see when she needs to follow up with [facility]. Please advise

## 2014-06-16 NOTE — Telephone Encounter (Signed)
Expand All Collapse All   ON RECALL LIST

## 2014-06-16 NOTE — Telephone Encounter (Signed)
Called pt and she is aware that we will follow up with her in 3 months

## 2014-06-23 ENCOUNTER — Telehealth: Payer: Self-pay | Admitting: Gastroenterology

## 2014-06-23 NOTE — Telephone Encounter (Signed)
Pt aware of results 

## 2014-06-23 NOTE — Telephone Encounter (Signed)
Please call pt. She had HYPERPLASTIC POLYPS removed.   DRINK WATER TO KEEP YOUR URINE LIGHT YELLOW.  FOLLOW A HIGH FIBER DIET. AVOID ITEMS THAT CAUSE BLOATING.   Next colonoscopy in 10 years. 

## 2014-06-23 NOTE — Telephone Encounter (Signed)
Reminder in epic °

## 2014-07-08 ENCOUNTER — Ambulatory Visit (INDEPENDENT_AMBULATORY_CARE_PROVIDER_SITE_OTHER): Payer: Self-pay | Admitting: Cardiology

## 2014-07-08 ENCOUNTER — Encounter: Payer: Self-pay | Admitting: Cardiology

## 2014-07-08 VITALS — BP 160/86 | HR 70 | Ht 60.0 in | Wt 178.0 lb

## 2014-07-08 DIAGNOSIS — R9430 Abnormal result of cardiovascular function study, unspecified: Secondary | ICD-10-CM

## 2014-07-08 DIAGNOSIS — E782 Mixed hyperlipidemia: Secondary | ICD-10-CM

## 2014-07-08 NOTE — Patient Instructions (Signed)
Your physician wants you to follow-up in: 6 months You will receive a reminder letter in the mail two months in advance. If you don't receive a letter, please call our office to schedule the follow-up appointment.     Your physician recommends that you continue on your current medications as directed. Please refer to the Current Medication list given to you today.      Thank you for choosing [facility] !        

## 2014-07-08 NOTE — Progress Notes (Signed)
Cardiology Office Note  Date: 07/08/2014   ID: Lisa [name], DOB 06-Jan-1961, MRN [id number]  PCP: Alda LeaMcElroy, Shannon G, PA-C  Primary Cardiologist: Nona DellSamuel Kameron Blethen, MD   Chief Complaint  Patient presents with  . Cardiac follow-up    History of Present Illness: Lisa AntiguaFrances M [name] is a 54 y.o. female last seen in September 2015. [facility]. She presents today with her significant other. Mainly reports ongoing psychosocial stressors, family issues - it is hard to follow her discussion. She does not endorse any angina symptoms. I reviewed her medications which are outlined below. She states that she is taking them, also following a vegan diet.  Interval follow-up with Dr. Darrick PennaFields noted for colonoscopy. Procedure performed in March of this year demonstrating moderate diverticulosis, small internal hemorrhoids with large external hemorrhoids, hyperplastic polyps removed.  Cardiac testing is outlined below.  Past Medical History  Diagnosis Date  . Diverticulitis   . Arthritis   . Asthma   . Mixed hyperlipidemia   . Essential hypertension, benign   . Tendinitis     Patient reports chronic problem involving her arms and hands  . Chronic back pain     Patient reports lower back disc problems  . Myocardial infarction     Patient reports related to "steroids" in 1985 ([place])  . High blood pressure   . High cholesterol   . Stroke   . Degenerative lumbar disc   . Bursitis   . PMB (postmenopausal bleeding) 03/18/2014  . Enlarged uterus 03/18/2014  . Mixed stress and urge urinary incontinence 03/18/2014  . GERD (gastroesophageal reflux disease)     Past Surgical History  Procedure Laterality Date  . Cesarean section    . Tonsillectomy    . Adnoids    . Colonoscopy N/A 06/04/2014    Procedure: COLONOSCOPY;  Surgeon: West BaliSandi L Fields, MD;  Location: [facility];  Service: Endoscopy;  Laterality: N/A;  1030 - moved to 3/24 @ 1:00    Current Outpatient  Prescriptions  Medication Sig Dispense Refill  . albuterol (PROVENTIL HFA;VENTOLIN HFA) 108 (90 BASE) MCG/ACT inhaler Inhale 2 puffs into the lungs every 6 (six) hours as needed for wheezing.    Marland Kitchen. aspirin EC 81 MG tablet Take 81 mg by mouth daily.     Marland Kitchen. B-Complex TABS Take 1 tablet by mouth daily.    . bisacodyl (DULCOLAX) 10 MG suppository Place 10 mg rectally as needed for moderate constipation.    . Calcium Carb-Vit D-Soy Isoflav (SOY FORMULA PO) Take by mouth 2 (two) times daily.    . Cholecalciferol (VITAMIN D) 2000 UNITS CAPS Take 1 capsule by mouth daily.    . Cobalamine Combinations (B-12) 1000-400 MCG SUBL Place 1 tablet under the tongue daily.    Marland Kitchen. DOCUSATE SODIUM PO Take by mouth as needed.    . Flaxseed, Linseed, (FLAX SEEDS PO) Take 1 scoop by mouth daily. Mixes it with food BID.    Marland Kitchen. hydrochlorothiazide (HYDRODIURIL) 25 MG tablet Take 25 mg by mouth every morning.     . Linaclotide (LINZESS) 145 MCG CAPS capsule Take 145 mcg by mouth daily.    . Multiple Vitamin (MULTIVITAMIN WITH MINERALS) TABS tablet Take 1 tablet by mouth daily.    . simvastatin (ZOCOR) 20 MG tablet Take 1 tablet (20 mg total) by mouth at bedtime. 30 tablet 9   No current facility-administered medications for this visit.    Allergies:  Metoprolol; Morphine and related;  Naproxen; Tramadol; Ibuprofen; and Penicillins   Social History: The patient  reports that she quit smoking about 29 years ago. Her smoking use included Cigarettes. She has a 24 pack-year smoking history. She has never used smokeless tobacco. She reports that she does not drink alcohol or use illicit drugs.   ROS:  Please see the history of present illness. Otherwise, complete review of systems is positive for none.  All other systems are reviewed and negative.   Physical Exam: VS:  BP 160/86 mmHg  Pulse 70  Ht 5' (1.524 m)  Wt 178 lb (80.74 kg)  BMI 34.76 kg/m2  LMP 12/26/2011, BMI Body mass index is 34.76 kg/(m^2).  Wt Readings from  Last 3 Encounters:  07/08/14 178 lb (80.74 kg)  06/04/14 178 lb (80.74 kg)  05/13/14 178 lb (80.74 kg)     Appears comfortable at rest.  HEENT: Conjunctiva and lids normal, oropharynx clear.  Neck: Supple, no elevated JVP or carotid bruits, no thyromegaly.  Lungs: Clear to auscultation, nonlabored breathing at rest.  Cardiac: Regular rate and rhythm, no S3 or significant systolic murmur, no pericardial rub.  Abdomen: Soft, nontender, bowel sounds present, no guarding or rebound.  Extremities: Trace ankle edema, distal pulses 1-2+.    ECG: ECG is not ordered today.  Other Studies Reviewed Today:  1. Dobutamine Cardiolite in September 2014 reported no diagnostic ST segment changes, small area of ischemia in the RCA distribution, LVEF 71%, overall low risk.   2. Echocardiogram in September 2014 demonstrated mild LVH with LVEF 65-70%, grade 1 diastolic dysfunction, trivial tricuspid regurgitation, unable to calculate PASP, CVP 8 mm mercury, no pericardial effusion.   ASSESSMENT AND PLAN:  1. Reported history of remote "heart attack" related to "steroids" in 1985 in [place], details are not available. Interval follow-up testing is outlined above including a mildly abnormal, overall low risk Dobutamine Cardiolite in 2014. She does not endorse any angina symptoms. Would recommend continuing medical therapy and observation. She is on aspirin, Zocor, and flaxseed.  2. Hyperlipidemia, on Zocor and flaxseed, followed at the [facility].  Current medicines were reviewed at length with the patient today.   Disposition: FU with me in 6 months.   Signed, Jonelle Sidle, MD, [facility] 07/08/2014 11:37 AM    [facility] [address] [place], [place] [address] Phone: [phone]; Fax: [phone]

## 2014-07-23 ENCOUNTER — Encounter: Payer: Self-pay | Admitting: Gastroenterology

## 2014-08-02 ENCOUNTER — Inpatient Hospital Stay (HOSPITAL_COMMUNITY)
Admission: EM | Admit: 2014-08-02 | Discharge: 2014-08-04 | DRG: 392 | Disposition: A | Discharge status: Home / Self Care | Payer: Self-pay | Attending: Internal Medicine | Admitting: Internal Medicine

## 2014-08-02 ENCOUNTER — Emergency Department (HOSPITAL_COMMUNITY): Payer: Self-pay

## 2014-08-02 ENCOUNTER — Encounter (HOSPITAL_COMMUNITY): Payer: Self-pay

## 2014-08-02 DIAGNOSIS — Z87891 Personal history of nicotine dependence: Secondary | ICD-10-CM

## 2014-08-02 DIAGNOSIS — Z8673 Personal history of transient ischemic attack (TIA), and cerebral infarction without residual deficits: Secondary | ICD-10-CM

## 2014-08-02 DIAGNOSIS — K5732 Diverticulitis of large intestine without perforation or abscess without bleeding: Principal | ICD-10-CM | POA: Diagnosis present

## 2014-08-02 DIAGNOSIS — Z8249 Family history of ischemic heart disease and other diseases of the circulatory system: Secondary | ICD-10-CM

## 2014-08-02 DIAGNOSIS — K5792 Diverticulitis of intestine, part unspecified, without perforation or abscess without bleeding: Secondary | ICD-10-CM

## 2014-08-02 DIAGNOSIS — J45909 Unspecified asthma, uncomplicated: Secondary | ICD-10-CM | POA: Diagnosis present

## 2014-08-02 DIAGNOSIS — K219 Gastro-esophageal reflux disease without esophagitis: Secondary | ICD-10-CM | POA: Diagnosis present

## 2014-08-02 DIAGNOSIS — I255 Ischemic cardiomyopathy: Secondary | ICD-10-CM | POA: Diagnosis present

## 2014-08-02 DIAGNOSIS — M1990 Unspecified osteoarthritis, unspecified site: Secondary | ICD-10-CM | POA: Diagnosis present

## 2014-08-02 DIAGNOSIS — E876 Hypokalemia: Secondary | ICD-10-CM | POA: Diagnosis present

## 2014-08-02 DIAGNOSIS — I252 Old myocardial infarction: Secondary | ICD-10-CM

## 2014-08-02 DIAGNOSIS — K529 Noninfective gastroenteritis and colitis, unspecified: Secondary | ICD-10-CM | POA: Diagnosis present

## 2014-08-02 DIAGNOSIS — E782 Mixed hyperlipidemia: Secondary | ICD-10-CM | POA: Diagnosis present

## 2014-08-02 DIAGNOSIS — I10 Essential (primary) hypertension: Secondary | ICD-10-CM | POA: Diagnosis present

## 2014-08-02 LAB — COMPREHENSIVE METABOLIC PANEL
ALT: 23 U/L (ref 14–54)
AST: 20 U/L (ref 15–41)
Albumin: 4.3 g/dL (ref 3.5–5.0)
Alkaline Phosphatase: 61 U/L (ref 38–126)
Anion gap: 10 (ref 5–15)
BILIRUBIN TOTAL: 0.4 mg/dL (ref 0.3–1.2)
BUN: 12 mg/dL (ref 6–20)
CALCIUM: 9.4 mg/dL (ref 8.9–10.3)
CO2: 24 mmol/L (ref 22–32)
Chloride: 102 mmol/L (ref 101–111)
Creatinine, Ser: 0.57 mg/dL (ref 0.44–1.00)
GFR calc Af Amer: >60 mL/min (ref >60)
GLUCOSE: 107 mg/dL — AB (ref 65–99)
Potassium: 3.6 mmol/L (ref 3.5–5.1)
Sodium: 136 mmol/L (ref 135–145)
Total Protein: 8.1 g/dL (ref 6.5–8.1)

## 2014-08-02 LAB — URINE MICROSCOPIC-ADD ON

## 2014-08-02 LAB — URINALYSIS, ROUTINE W REFLEX MICROSCOPIC
Bilirubin Urine: NEGATIVE
Glucose, UA: NEGATIVE mg/dL
KETONES UR: NEGATIVE mg/dL
Leukocytes, UA: NEGATIVE
Nitrite: NEGATIVE
PROTEIN: NEGATIVE mg/dL
Specific Gravity, Urine: 1.02 (ref 1.005–1.030)
Urobilinogen, UA: 0.2 mg/dL (ref 0.0–1.0)
pH: 7 (ref 5.0–8.0)

## 2014-08-02 LAB — CBC WITH DIFFERENTIAL/PLATELET
Basophils Absolute: 0 10*3/uL (ref 0.0–0.1)
Basophils Relative: 0 % (ref 0–1)
EOS PCT: 1 % (ref 0–5)
Eosinophils Absolute: 0.1 10*3/uL (ref 0.0–0.7)
HCT: 38 % (ref 36.0–46.0)
Hemoglobin: 13.3 g/dL (ref 12.0–15.0)
LYMPHS ABS: 1.6 10*3/uL (ref 0.7–4.0)
Lymphocytes Relative: 17 % (ref 12–46)
MCH: 30 pg (ref 26.0–34.0)
MCHC: 35 g/dL (ref 30.0–36.0)
MCV: 85.6 fL (ref 78.0–100.0)
MONO ABS: 0.6 10*3/uL (ref 0.1–1.0)
MONOS PCT: 6 % (ref 3–12)
Neutro Abs: 6.8 10*3/uL (ref 1.7–7.7)
Neutrophils Relative %: 76 % (ref 43–77)
Platelets: 322 10*3/uL (ref 150–400)
RBC: 4.44 MIL/uL (ref 3.87–5.11)
RDW: 12.6 % (ref 11.5–15.5)
WBC: 9 10*3/uL (ref 4.0–10.5)

## 2014-08-02 LAB — LIPASE, BLOOD: LIPASE: 24 U/L (ref 22–51)

## 2014-08-02 MED ORDER — HYDROMORPHONE HCL 1 MG/ML IJ SOLN
0.5000 mg | Freq: Once | INTRAMUSCULAR | Status: AC
  Administered 2014-08-02: 0.5 mg via INTRAVENOUS
  Filled 2014-08-02: qty 1

## 2014-08-02 MED ORDER — ONDANSETRON HCL 4 MG PO TABS: 4.0000 mg | ORAL_TABLET | Freq: Four times a day (QID) | ORAL | Status: DC | PRN

## 2014-08-02 MED ORDER — HEPARIN SODIUM (PORCINE) 5000 UNIT/ML IJ SOLN
5000.0000 [IU] | Freq: Three times a day (TID) | INTRAMUSCULAR | Status: DC
  Administered 2014-08-02 – 2014-08-04 (×5): 5000 [IU] via SUBCUTANEOUS
  Filled 2014-08-02 (×3): qty 1

## 2014-08-02 MED ORDER — CIPROFLOXACIN IN D5W 400 MG/200ML IV SOLN
400.0000 mg | Freq: Once | INTRAVENOUS | Status: AC
  Administered 2014-08-02: 400 mg via INTRAVENOUS
  Filled 2014-08-02: qty 200

## 2014-08-02 MED ORDER — METRONIDAZOLE 500 MG PO TABS
500.0000 mg | ORAL_TABLET | Freq: Once | ORAL | Status: AC
  Administered 2014-08-02: 500 mg via ORAL
  Filled 2014-08-02: qty 1

## 2014-08-02 MED ORDER — ONDANSETRON HCL 4 MG/2ML IJ SOLN
4.0000 mg | Freq: Four times a day (QID) | INTRAMUSCULAR | Status: DC | PRN
  Administered 2014-08-03: 4 mg via INTRAVENOUS

## 2014-08-02 MED ORDER — SODIUM CHLORIDE 0.9 % IV SOLN: INTRAVENOUS | Status: AC

## 2014-08-02 MED ORDER — CIPROFLOXACIN IN D5W 400 MG/200ML IV SOLN
400.0000 mg | Freq: Two times a day (BID) | INTRAVENOUS | Status: DC
  Administered 2014-08-03 – 2014-08-04 (×3): 400 mg via INTRAVENOUS
  Filled 2014-08-02 (×3): qty 200

## 2014-08-02 MED ORDER — SODIUM CHLORIDE 0.9 % IV SOLN
INTRAVENOUS | Status: DC
  Administered 2014-08-02: 1000 mL via INTRAVENOUS
  Administered 2014-08-04: 75 mL/h via INTRAVENOUS

## 2014-08-02 MED ORDER — SODIUM CHLORIDE 0.9 % IV SOLN
INTRAVENOUS | Status: DC
  Administered 2014-08-02: 10:00:00 via INTRAVENOUS

## 2014-08-02 MED ORDER — ONDANSETRON HCL 4 MG/2ML IJ SOLN
4.0000 mg | Freq: Three times a day (TID) | INTRAMUSCULAR | Status: AC | PRN
Start: 2014-08-02 — End: 2014-08-03
  Administered 2014-08-02: 4 mg via INTRAVENOUS
  Filled 2014-08-02 (×2): qty 2

## 2014-08-02 MED ORDER — ONDANSETRON 4 MG PO TBDP
4.0000 mg | ORAL_TABLET | Freq: Once | ORAL | Status: AC
  Administered 2014-08-02: 4 mg via ORAL
  Filled 2014-08-02: qty 1

## 2014-08-02 MED ORDER — IOHEXOL 300 MG/ML  SOLN
100.0000 mL | Freq: Once | INTRAMUSCULAR | Status: AC | PRN
  Administered 2014-08-02: 100 mL via INTRAVENOUS

## 2014-08-02 MED ORDER — LINACLOTIDE 145 MCG PO CAPS
145.0000 ug | ORAL_CAPSULE | Freq: Every day | ORAL | Status: DC
  Administered 2014-08-02 – 2014-08-04 (×2): 145 ug via ORAL
  Filled 2014-08-02 (×3): qty 1

## 2014-08-02 MED ORDER — HYDROMORPHONE HCL 1 MG/ML IJ SOLN
0.5000 mg | INTRAMUSCULAR | Status: DC | PRN
  Filled 2014-08-02: qty 1

## 2014-08-02 MED ORDER — METRONIDAZOLE IN NACL 5-0.79 MG/ML-% IV SOLN
500.0000 mg | Freq: Three times a day (TID) | INTRAVENOUS | Status: DC
  Administered 2014-08-03 – 2014-08-04 (×5): 500 mg via INTRAVENOUS
  Filled 2014-08-02 (×5): qty 100

## 2014-08-02 MED ORDER — ALBUTEROL SULFATE (2.5 MG/3ML) 0.083% IN NEBU: 3.0000 mL | INHALATION_SOLUTION | Freq: Four times a day (QID) | RESPIRATORY_TRACT | Status: DC | PRN

## 2014-08-02 MED ORDER — ACETAMINOPHEN 325 MG PO TABS
650.0000 mg | ORAL_TABLET | Freq: Four times a day (QID) | ORAL | Status: DC | PRN
  Administered 2014-08-02 – 2014-08-03 (×2): 650 mg via ORAL
  Filled 2014-08-02 (×2): qty 2

## 2014-08-02 MED ORDER — ACETAMINOPHEN 650 MG RE SUPP: 650.0000 mg | Freq: Four times a day (QID) | RECTAL | Status: DC | PRN

## 2014-08-02 MED ORDER — IOHEXOL 300 MG/ML  SOLN
25.0000 mL | Freq: Once | INTRAMUSCULAR | Status: AC | PRN
  Administered 2014-08-02: 25 mL via ORAL

## 2014-08-02 MED ORDER — PROMETHAZINE HCL 25 MG/ML IJ SOLN
12.5000 mg | Freq: Four times a day (QID) | INTRAMUSCULAR | Status: DC | PRN
  Administered 2014-08-02: 12.5 mg via INTRAVENOUS
  Filled 2014-08-02: qty 1

## 2014-08-02 NOTE — H&P (Signed)
[facility]          History and Physical    PCP:   Jacqualine Mau, PA-C   EDP: Noemi Chapel, EDP  Chief Complaint:  Abdominal pain, nausea, vomiting  HPI: Patient is a 54 year old woman with past medical history significant for degenerative disc disease, ischemic cardiomyopathy and hypertension who presents to the hospital with the above-mentioned complaints that began on Wednesday night about a week prior to admission. Symptoms have gotten progressively worse. She has vomited about 10 times in this timeframe and today abdominal pain became unbearable prompting her to seek medical attention. In the emergency department lab work is normal, CT scan is done which shows acute left sigmoid diverticulitis. Because patient has unremitting pain and is unable to tolerate by mouth's she has been referred to [place] for admission for further evaluation and management.  Allergies:   Allergies  Allergen Reactions  . Metoprolol Nausea And Vomiting    Dizziness Elevated BP  . Morphine And Related Hives  . Naproxen Other (See Comments)    Lightheadedness  . Tramadol Nausea And Vomiting  . Ibuprofen Rash  . Penicillins Rash      Past Medical History  Diagnosis Date  . Diverticulitis   . Arthritis   . Asthma   . Mixed hyperlipidemia   . Essential hypertension, benign   . Tendinitis     Patient reports chronic problem involving her arms and hands  . Chronic back pain     Patient reports lower back disc problems  . Myocardial infarction     Patient reports related to "steroids" in 1985 ([place])  . High blood pressure   . High cholesterol   . Stroke   . Degenerative lumbar disc   . Bursitis   . PMB (postmenopausal bleeding) 03/18/2014  . Enlarged uterus 03/18/2014  . Mixed stress and urge urinary incontinence 03/18/2014  . GERD (gastroesophageal reflux disease)     Past Surgical History  Procedure Laterality Date  . Cesarean section    . Tonsillectomy    .  Adnoids    . Colonoscopy N/A 06/04/2014    Procedure: COLONOSCOPY;  Surgeon: Danie Binder, MD;  Location: [facility];  Service: Endoscopy;  Laterality: N/A;  1030 - moved to 3/24 @ 1:00    Prior to Admission medications   Medication Sig Start Date End Date Taking? Authorizing Provider  albuterol (PROVENTIL HFA;VENTOLIN HFA) 108 (90 BASE) MCG/ACT inhaler Inhale 2 puffs into the lungs every 6 (six) hours as needed for wheezing.   Yes Historical Provider, MD  aspirin EC 81 MG tablet Take 81 mg by mouth daily.    Yes Historical Provider, MD  DOCUSATE SODIUM PO Take 1 capsule by mouth daily as needed (constipation).    Yes Historical Provider, MD  hydrochlorothiazide (HYDRODIURIL) 25 MG tablet Take 25 mg by mouth every morning.    Yes Historical Provider, MD  lactulose (CHRONULAC) 10 GM/15ML solution Take 10 g by mouth 2 (two) times daily as needed for mild constipation.   Yes Historical Provider, MD  Linaclotide Rolan Lipa) 145 MCG CAPS capsule Take 145 mcg by mouth daily.   Yes Historical Provider, MD  simvastatin (ZOCOR) 20 MG tablet Take 1 tablet (20 mg total) by mouth at bedtime. 11/24/13  Yes Satira Sark, MD    Social History:  reports that she quit smoking about 29 years ago. Her smoking use included Cigarettes.  She has a 24 pack-year smoking history. She has never used smokeless tobacco. She reports that she does not drink alcohol or use illicit drugs.  Family History  Problem Relation Age of Onset  . Adopted: Yes  . Other Mother     aneursym  . Other Maternal Uncle     stenosis  . Arthritis Maternal Uncle   . Angina Son   . ADD / ADHD Son   . Other Son     food allergies  . Blindness Daughter     from birth; brain damage  . Drug abuse Sister   . Heart attack Paternal Aunt   . Alcohol abuse Cousin   . Drug abuse Cousin     Review of Systems:  Constitutional: Denies fever, chills, diaphoresis, appetite change and fatigue.  HEENT: Denies photophobia, eye pain,  redness, hearing loss, ear pain, congestion, sore throat, rhinorrhea, sneezing, mouth sores, trouble swallowing, neck pain, neck stiffness and tinnitus.   Respiratory: Denies SOB, DOE, cough, chest tightness,  and wheezing.   Cardiovascular: Denies chest pain, palpitations and leg swelling.  Gastrointestinal: Denies, diarrhea, constipation, blood in stool and abdominal distention.  Genitourinary: Denies dysuria, urgency, frequency, hematuria, flank pain and difficulty urinating.  Endocrine: Denies: hot or cold intolerance, sweats, changes in hair or nails, polyuria, polydipsia. Musculoskeletal: Denies myalgias, back pain, joint swelling, arthralgias and gait problem.  Skin: Denies pallor, rash and wound.  Neurological: Denies dizziness, seizures, syncope, weakness, light-headedness, numbness and headaches.  Hematological: Denies adenopathy. Easy bruising, personal or family bleeding history  Psychiatric/Behavioral: Denies suicidal ideation, mood changes, confusion, nervousness, sleep disturbance and agitation   Physical Exam: Blood pressure 167/83, pulse 64, temperature 98.4 F (36.9 C), temperature source Oral, resp. rate 18, height 5' (1.524 m), weight 78.926 kg (174 lb), last menstrual period 12/26/2011, SpO2 100 %. General: Alert, awake, oriented 3 HEENT: Normocephalic, atraumatic, pupils equal and reactive to light, extraocular movements intact, dry mucous membranes Neck: Supple, no JVD, no lymphadenopathy, no bruits, no goiter. Cardiovascular: Regular rate and rhythm, no murmurs, rubs or gallops. Lungs: Clear to auscultation bilaterally. Abdomen: Soft, nondistended, positive bowel sounds, no rigidity or guarding, tender to palpation to the left lower quadrant and suprapubic area. Extremities: No clubbing, cyanosis or edema, positive pulses. Neurologic: Grossly intact and nonfocal.  Labs on Admission:  Results for orders placed or performed during the hospital encounter of 08/02/14  (from the past 48 hour(s))  CBC with Differential     Status: None   Collection Time: 08/02/14 10:05 AM  Result Value Ref Range   WBC 9.0 4.0 - 10.5 K/uL   RBC 4.44 3.87 - 5.11 MIL/uL   Hemoglobin 13.3 12.0 - 15.0 g/dL   HCT 38.0 36.0 - 46.0 %   MCV 85.6 78.0 - 100.0 fL   MCH 30.0 26.0 - 34.0 pg   MCHC 35.0 30.0 - 36.0 g/dL   RDW 12.6 11.5 - 15.5 %   Platelets 322 150 - 400 K/uL   Neutrophils Relative % 76 43 - 77 %   Neutro Abs 6.8 1.7 - 7.7 K/uL   Lymphocytes Relative 17 12 - 46 %   Lymphs Abs 1.6 0.7 - 4.0 K/uL   Monocytes Relative 6 3 - 12 %   Monocytes Absolute 0.6 0.1 - 1.0 K/uL   Eosinophils Relative 1 0 - 5 %   Eosinophils Absolute 0.1 0.0 - 0.7 K/uL   Basophils Relative 0 0 - 1 %   Basophils Absolute 0.0 0.0 - 0.1  K/uL  Lipase, blood     Status: None   Collection Time: 08/02/14 10:05 AM  Result Value Ref Range   Lipase 24 22 - 51 U/L  Comprehensive metabolic panel     Status: Abnormal   Collection Time: 08/02/14 10:05 AM  Result Value Ref Range   Sodium 136 135 - 145 mmol/L   Potassium 3.6 3.5 - 5.1 mmol/L   Chloride 102 101 - 111 mmol/L   CO2 24 22 - 32 mmol/L   Glucose, Bld 107 (H) 65 - 99 mg/dL   BUN 12 6 - 20 mg/dL   Creatinine, Ser 0.57 0.44 - 1.00 mg/dL   Calcium 9.4 8.9 - 10.3 mg/dL   Total Protein 8.1 6.5 - 8.1 g/dL   Albumin 4.3 3.5 - 5.0 g/dL   AST 20 15 - 41 U/L   ALT 23 14 - 54 U/L   Alkaline Phosphatase 61 38 - 126 U/L   Total Bilirubin 0.4 0.3 - 1.2 mg/dL   GFR calc non Af Amer >60 >60 mL/min   GFR calc Af Amer >60 >60 mL/min    Comment: (NOTE) The eGFR has been calculated using the CKD EPI equation. This calculation has not been validated in all clinical situations. eGFR's persistently <60 mL/min signify possible Chronic Kidney Disease.    Anion gap 10 5 - 15  Urinalysis, Routine w reflex microscopic     Status: Abnormal   Collection Time: 08/02/14 10:07 AM  Result Value Ref Range   Color, Urine YELLOW YELLOW   APPearance HAZY (A) CLEAR    Specific Gravity, Urine 1.020 1.005 - 1.030   pH 7.0 5.0 - 8.0   Glucose, UA NEGATIVE NEGATIVE mg/dL   Hgb urine dipstick TRACE (A) NEGATIVE   Bilirubin Urine NEGATIVE NEGATIVE   Ketones, ur NEGATIVE NEGATIVE mg/dL   Protein, ur NEGATIVE NEGATIVE mg/dL   Urobilinogen, UA 0.2 0.0 - 1.0 mg/dL   Nitrite NEGATIVE NEGATIVE   Leukocytes, UA NEGATIVE NEGATIVE  Urine microscopic-add on     Status: Abnormal   Collection Time: 08/02/14 10:07 AM  Result Value Ref Range   Squamous Epithelial / LPF FEW (A) RARE   RBC / HPF 0-2 <3 RBC/hpf   Bacteria, UA FEW (A) RARE    Radiological Exams on Admission: Ct Abdomen Pelvis W Contrast  08/02/2014   CLINICAL DATA:  Worsening lower abdominal pain for the past 3 days. Vomiting this morning. Loose stools. History of diverticulitis.  EXAM: CT ABDOMEN AND PELVIS WITH CONTRAST  TECHNIQUE: Multidetector CT imaging of the abdomen and pelvis was performed using the standard protocol following bolus administration of intravenous contrast.  CONTRAST:  23m OMNIPAQUE IOHEXOL 300 MG/ML SOLN, 1062mOMNIPAQUE IOHEXOL 300 MG/ML SOLN  COMPARISON:  06/05/2013.  Pelvic ultrasound dated 04/10/2014.  FINDINGS: Concentric low-density wall thickening with mild pericolonic soft tissue stranding in the mid sigmoid colon in an area of a large number of diverticula. This is causing moderate to marked diffuse luminal narrowing. No proximal dilatation. These changes were not previously present. There are multiple diverticula elsewhere in the colon.  No evidence of appendicitis. Mildly heterogeneous uterine enhancement, corresponding to the heterogeneity seen on the recent ultrasound. No adnexal masses or enlarged lymph nodes. No gastric or small bowel abnormalities.  The minimal diffuse low density of the liver relative to the spleen. Small right lobe liver cyst. Normal appearing spleen, pancreas, gallbladder, adrenal glands, kidneys and urinary bladder. Clear lung bases. Lower thoracic  spine degenerative changes and minimal lumbar  spine degenerative changes.  IMPRESSION: 1. Acute diverticulitis involving the mid sigmoid colon without abscess. This is causing moderate to marked concentric luminal narrowing without obstruction. 2. Extensive colonic diverticulosis. 3. Minimal diffuse hepatic steatosis.   Electronically Signed   By: Claudie Revering M.D.   On: 08/02/2014 12:12    Assessment/Plan Principal Problem:   Acute diverticulitis Active Problems:   Essential hypertension, benign   Acute colitis   Acute uncomplicated sigmoid diverticulitis -We'll admit for IV antibiotics. Cipro and Flagyl. -Pain control and antiemetics as necessary. -Clear liquid diet and advance as tolerated.  Hypertension -Hold hydrochlorothiazide given she is currently nothing by mouth.  DVT prophylaxis -Subcutaneous heparin  CODE STATUS -Full code   Time Spent on Admission: 80 minutes  HERNANDEZ ACOSTA,[name] [facility] Pager: [phone] 08/02/2014, 3:42 PM

## 2014-08-02 NOTE — Progress Notes (Signed)
Notified Dr. Ardyth HarpsHernandez about the patients stating that the zofran makes her nauseous.  New orders given and followed.

## 2014-08-02 NOTE — ED Notes (Signed)
Patient reports she woke up Thursday morning with stomach pain and thought it was constipation. Pain continues to get worse in lower abdomen.Episode of vomiting this morning, as well as loose stools

## 2014-08-02 NOTE — ED Provider Notes (Signed)
CSN: [id number]     Arrival date & time 08/02/14  3086 History   First MD Initiated Contact with Patient 08/02/14 [phone]     Chief Complaint  Patient presents with  . Abdominal Pain     (Consider location/radiation/quality/duration/timing/severity/associated sxs/prior Treatment) Patient is a 54 y.o. female presenting with abdominal pain. The history is provided by the patient.  Abdominal Pain Pain location:  LLQ Pain quality: burning and cramping   Pain radiates to:  Suprapubic region Pain severity:  Severe Onset quality:  Gradual Duration:  3 days Timing:  Constant Progression:  Worsening Chronicity: similar to diverticulitis. Relieved by:  None tried Worsened by:  Movement Ineffective treatments:  None tried Associated symptoms: anorexia, chills, fever, nausea and vomiting    Lisa [name] is a 54 y.o. female who presents to the ED with lower abdominal pain that started 3 days ago. The pain is getting progressively worse and she reports that this morning she started having vomiting and loose stools. She thought initially IBS and pain due to constipation. She has not had the money to get her medication for IBS filled. She has not taken anything for pain. She did take a laxative last night.   Past Medical History  Diagnosis Date  . Diverticulitis   . Arthritis   . Asthma   . Mixed hyperlipidemia   . Essential hypertension, benign   . Tendinitis     Patient reports chronic problem involving her arms and hands  . Chronic back pain     Patient reports lower back disc problems  . Myocardial infarction     Patient reports related to "steroids" in 1985 ([place])  . High blood pressure   . High cholesterol   . Stroke   . Degenerative lumbar disc   . Bursitis   . PMB (postmenopausal bleeding) 03/18/2014  . Enlarged uterus 03/18/2014  . Mixed stress and urge urinary incontinence 03/18/2014  . GERD (gastroesophageal reflux disease)    Past Surgical History  Procedure Laterality Date   . Cesarean section    . Tonsillectomy    . Adnoids    . Colonoscopy N/A 06/04/2014    Procedure: COLONOSCOPY;  Surgeon: West Bali, MD;  Location: [facility];  Service: Endoscopy;  Laterality: N/A;  1030 - moved to 3/24 @ 1:00   Family History  Problem Relation Age of Onset  . Adopted: Yes  . Other Mother     aneursym  . Other Maternal Uncle     stenosis  . Arthritis Maternal Uncle   . Angina Son   . ADD / ADHD Son   . Other Son     food allergies  . Blindness Daughter     from birth; brain damage  . Drug abuse Sister   . Heart attack Paternal Aunt   . Alcohol abuse Cousin   . Drug abuse Cousin    History  Substance Use Topics  . Smoking status: Former Smoker -- 2.00 packs/day for 12 years    Types: Cigarettes    Quit date: 03/13/1985  . Smokeless tobacco: Never Used  . Alcohol Use: No   OB History    Gravida Para Term Preterm AB TAB SAB Ectopic Multiple Living   8 8             Review of Systems  Constitutional: Positive for fever and chills.  Gastrointestinal: Positive for nausea, vomiting, abdominal pain and anorexia.  all other systems negative    Allergies  Metoprolol; Morphine and related; Naproxen; Tramadol; Ibuprofen; and Penicillins  Home Medications   Prior to Admission medications   Medication Sig Start Date End Date Taking? Authorizing Provider  albuterol (PROVENTIL HFA;VENTOLIN HFA) 108 (90 BASE) MCG/ACT inhaler Inhale 2 puffs into the lungs every 6 (six) hours as needed for wheezing.   Yes Historical Provider, MD  aspirin EC 81 MG tablet Take 81 mg by mouth daily.    Yes Historical Provider, MD  DOCUSATE SODIUM PO Take 1 capsule by mouth daily as needed (constipation).    Yes Historical Provider, MD  hydrochlorothiazide (HYDRODIURIL) 25 MG tablet Take 25 mg by mouth every morning.    Yes Historical Provider, MD  lactulose (CHRONULAC) 10 GM/15ML solution Take 10 g by mouth 2 (two) times daily as needed for mild constipation.   Yes  Historical Provider, MD  Linaclotide Karlene Einstein) 145 MCG CAPS capsule Take 145 mcg by mouth daily.   Yes Historical Provider, MD  simvastatin (ZOCOR) 20 MG tablet Take 1 tablet (20 mg total) by mouth at bedtime. 11/24/13  Yes Jonelle Sidle, MD   BP 167/83 mmHg  Pulse 64  Temp(Src) 98.4 F (36.9 C) (Oral)  Resp 18  Ht 5' (1.524 m)  Wt 174 lb (78.926 kg)  BMI 33.98 kg/m2  SpO2 100%  LMP 12/26/2011 Physical Exam  Constitutional: She is oriented to person, place, and time. She appears well-developed and well-nourished.  HENT:  Head: Normocephalic.  Eyes: EOM are normal.  Neck: Normal range of motion. Neck supple.  Cardiovascular: Normal rate and regular rhythm.   Pulmonary/Chest: Effort normal. She has no wheezes. She has no rales.  Abdominal: Soft. There is tenderness in the left lower quadrant. There is guarding. There is no rebound and no CVA tenderness.  Musculoskeletal: Normal range of motion.  Neurological: She is alert and oriented to person, place, and time. No cranial nerve deficit.  Skin: Skin is warm and dry.  Psychiatric: She has a normal mood and affect. Her behavior is normal.  Nursing note and vitals reviewed.   ED Course  Procedures (including critical care time) Labs, CT, Zofran, Dilaudid (patient request minimal amount of pain medication)  Patient's pain improved with dilaudid but on return from CT pain had returned.   Results for orders placed or performed during the hospital encounter of 08/02/14 (from the past 24 hour(s))  CBC with Differential     Status: None   Collection Time: 08/02/14 10:05 AM  Result Value Ref Range   WBC 9.0 4.0 - 10.5 K/uL   RBC 4.44 3.87 - 5.11 MIL/uL   Hemoglobin 13.3 12.0 - 15.0 g/dL   HCT 16.1 09.6 - 04.5 %   MCV 85.6 78.0 - 100.0 fL   MCH 30.0 26.0 - 34.0 pg   MCHC 35.0 30.0 - 36.0 g/dL   RDW 40.9 81.1 - 91.4 %   Platelets 322 150 - 400 K/uL   Neutrophils Relative % 76 43 - 77 %   Neutro Abs 6.8 1.7 - 7.7 K/uL    Lymphocytes Relative 17 12 - 46 %   Lymphs Abs 1.6 0.7 - 4.0 K/uL   Monocytes Relative 6 3 - 12 %   Monocytes Absolute 0.6 0.1 - 1.0 K/uL   Eosinophils Relative 1 0 - 5 %   Eosinophils Absolute 0.1 0.0 - 0.7 K/uL   Basophils Relative 0 0 - 1 %   Basophils Absolute 0.0 0.0 - 0.1 K/uL  Lipase, blood     Status: None  Collection Time: 08/02/14 10:05 AM  Result Value Ref Range   Lipase 24 22 - 51 U/L  Comprehensive metabolic panel     Status: Abnormal   Collection Time: 08/02/14 10:05 AM  Result Value Ref Range   Sodium 136 135 - 145 mmol/L   Potassium 3.6 3.5 - 5.1 mmol/L   Chloride 102 101 - 111 mmol/L   CO2 24 22 - 32 mmol/L   Glucose, Bld 107 (H) 65 - 99 mg/dL   BUN 12 6 - 20 mg/dL   Creatinine, Ser 1.610.57 0.44 - 1.00 mg/dL   Calcium 9.4 8.9 - 09.610.3 mg/dL   Total Protein 8.1 6.5 - 8.1 g/dL   Albumin 4.3 3.5 - 5.0 g/dL   AST 20 15 - 41 U/L   ALT 23 14 - 54 U/L   Alkaline Phosphatase 61 38 - 126 U/L   Total Bilirubin 0.4 0.3 - 1.2 mg/dL   GFR calc non Af Amer >60 >60 mL/min   GFR calc Af Amer >60 >60 mL/min   Anion gap 10 5 - 15  Urinalysis, Routine w reflex microscopic     Status: Abnormal   Collection Time: 08/02/14 10:07 AM  Result Value Ref Range   Color, Urine YELLOW YELLOW   APPearance HAZY (A) CLEAR   Specific Gravity, Urine 1.020 1.005 - 1.030   pH 7.0 5.0 - 8.0   Glucose, UA NEGATIVE NEGATIVE mg/dL   Hgb urine dipstick TRACE (A) NEGATIVE   Bilirubin Urine NEGATIVE NEGATIVE   Ketones, ur NEGATIVE NEGATIVE mg/dL   Protein, ur NEGATIVE NEGATIVE mg/dL   Urobilinogen, UA 0.2 0.0 - 1.0 mg/dL   Nitrite NEGATIVE NEGATIVE   Leukocytes, UA NEGATIVE NEGATIVE  Urine microscopic-add on     Status: Abnormal   Collection Time: 08/02/14 10:07 AM  Result Value Ref Range   Squamous Epithelial / LPF FEW (A) RARE   RBC / HPF 0-2 <3 RBC/hpf   Bacteria, UA FEW (A) RARE     Imaging Review Ct Abdomen Pelvis W Contrast  08/02/2014   CLINICAL DATA:  Worsening lower abdominal pain  for the past 3 days. Vomiting this morning. Loose stools. History of diverticulitis.  EXAM: CT ABDOMEN AND PELVIS WITH CONTRAST  TECHNIQUE: Multidetector CT imaging of the abdomen and pelvis was performed using the standard protocol following bolus administration of intravenous contrast.  CONTRAST:  25mL OMNIPAQUE IOHEXOL 300 MG/ML SOLN, 100mL OMNIPAQUE IOHEXOL 300 MG/ML SOLN  COMPARISON:  06/05/2013.  Pelvic ultrasound dated 04/10/2014.  FINDINGS: Concentric low-density wall thickening with mild pericolonic soft tissue stranding in the mid sigmoid colon in an area of a large number of diverticula. This is causing moderate to marked diffuse luminal narrowing. No proximal dilatation. These changes were not previously present. There are multiple diverticula elsewhere in the colon.  No evidence of appendicitis. Mildly heterogeneous uterine enhancement, corresponding to the heterogeneity seen on the recent ultrasound. No adnexal masses or enlarged lymph nodes. No gastric or small bowel abnormalities.  The minimal diffuse low density of the liver relative to the spleen. Small right lobe liver cyst. Normal appearing spleen, pancreas, gallbladder, adrenal glands, kidneys and urinary bladder. Clear lung bases. Lower thoracic spine degenerative changes and minimal lumbar spine degenerative changes.  IMPRESSION: 1. Acute diverticulitis involving the mid sigmoid colon without abscess. This is causing moderate to marked concentric luminal narrowing without obstruction. 2. Extensive colonic diverticulosis. 3. Minimal diffuse hepatic steatosis.   Electronically Signed   By: Beckie SaltsSteven  Reid M.D.   On:  08/02/2014 12:12     EKG Interpretation None     Discussed with Dr. Hyacinth Meeker and he will see the patient.  MDM  54 y.o. female with abdominal pain x 4 days that has gotten progressively worse.  Dr. Hyacinth Meeker discussed CT findings with the patient and plan for admission, patient agrees with plan. Dr. Hyacinth Meeker to discuss with admitting  MD.   Final diagnoses:  Diverticulitis of sigmoid colon      Janne Napoleon, NP 08/02/14 1409  Eber Hong, MD 08/02/14 [phone]

## 2014-08-02 NOTE — ED Provider Notes (Signed)
54 year old female, prior history of cesarean section who presents with pain in her lower abdomen which started on Wednesday, there was associated nausea and today she developed some vomiting. On exam the patient is overweight, has a soft abdomen with tenderness across the mid to lower abdomen both left and right lower quadrants, heart and lung exams are normal, the patient appears uncomfortable. Labs are unremarkable overall but CT scan shows significant diverticulitis, there is some luminal narrowing of the bowel, she will need to be admitted to the hospital for fluids and IV antibiotics, this was discussed with the patient was in agreement.  D/w Dr. Ardyth HarpsHernandez who will admit.  Medical screening examination/treatment/procedure(s) were conducted as a shared visit with non-physician practitioner(s) and myself.  I personally evaluated the patient during the encounter.  Clinical Impression:   Final diagnoses:  Diverticulitis of sigmoid colon         Eber HongBrian Arpita Fentress, MD 08/02/14 1421

## 2014-08-03 LAB — BASIC METABOLIC PANEL
Anion gap: 6 (ref 5–15)
BUN: 7 mg/dL (ref 6–20)
CALCIUM: 8.8 mg/dL — AB (ref 8.9–10.3)
CHLORIDE: 102 mmol/L (ref 101–111)
CO2: 27 mmol/L (ref 22–32)
CREATININE: 0.6 mg/dL (ref 0.44–1.00)
GFR calc Af Amer: >60 mL/min (ref >60)
Glucose, Bld: 113 mg/dL — ABNORMAL HIGH (ref 65–99)
Potassium: 3.1 mmol/L — ABNORMAL LOW (ref 3.5–5.1)
Sodium: 135 mmol/L (ref 135–145)

## 2014-08-03 LAB — CBC
HCT: 35 % — ABNORMAL LOW (ref 36.0–46.0)
Hemoglobin: 12 g/dL (ref 12.0–15.0)
MCH: 29.6 pg (ref 26.0–34.0)
MCHC: 34.3 g/dL (ref 30.0–36.0)
MCV: 86.4 fL (ref 78.0–100.0)
Platelets: 293 10*3/uL (ref 150–400)
RBC: 4.05 MIL/uL (ref 3.87–5.11)
RDW: 12.7 % (ref 11.5–15.5)
WBC: 10.6 10*3/uL — AB (ref 4.0–10.5)

## 2014-08-03 LAB — MAGNESIUM: MAGNESIUM: 1.7 mg/dL (ref 1.7–2.4)

## 2014-08-03 MED ORDER — POTASSIUM CHLORIDE CRYS ER 20 MEQ PO TBCR
40.0000 meq | EXTENDED_RELEASE_TABLET | ORAL | Status: AC
  Administered 2014-08-03 (×2): 40 meq via ORAL
  Filled 2014-08-03 (×2): qty 2

## 2014-08-03 NOTE — Care Management Note (Signed)
Case Management Note  Patient Details  Name: Vonda AntiguaFrances M Abundis MRN: [id number] Date of Birth: [birth year]  Expected Discharge Date:                  Expected Discharge Plan:  Home w Home Health Services  In-House Referral:  Financial Counselor  Discharge planning Services  CM Consult  Post Acute Care Choice:  NA Choice offered to:  NA  DME Arranged:    DME Agency:     HH Arranged:    HH Agency:     Status of Service:  In process, will continue to follow  Medicare Important Message Given:    Date Medicare IM Given:    Medicare IM give by:    Date Additional Medicare IM Given:    Additional Medicare Important Message give by:     If discussed at Long Length of Stay Meetings, dates discussed:    Additional Comments: Pt is from home, lives with "Peyton NajjarLarry", significant other. Pt independent at baseline. Pt has a walker but doesn't use it. [facility]. Pt uses [facility] for transportation. Pt is uninsured but says she has a disability claim in progress, has been referred to the financial counselor. Pt may require a MATCH voucher at [place]. Will cont to follow.   Malcolm Metrohildress, Terrionna Bridwell Demske, RN 08/03/2014, 2:13 PM

## 2014-08-03 NOTE — Progress Notes (Addendum)
[facility] PROGRESS NOTE  Lisa [name] RUE:[id number] DOB: Oct 25, 1960 DOA: 08/02/2014 PCP: Lisa Ora, PA-C  Assessment/Plan: Acute uncomplicated sigmoid diverticulitis -Continue IV Cipro and Flagyl. -Continue to advance diet as tolerated. -Pain control and antibiotics as necessary. -Patient still states she has significant abdominal pain and emesis overnight.  Hypertension -Continue to hold hydrochlorothiazide. -Consider restarting once able to reliably use the by mouth route.  Hypokalemia -Potassium today 3.1. -Replete orally. Likely due to GI losses. -Mag within normal limits at 1.7.  Code Status: Full code Family Communication: Patient only  Disposition Plan: Home when ready, anticipate 24-48 hours   Consultants:  None   Antibiotics:  Cipro  Flagyl   Subjective: Had increased nausea and vomiting overnight as well as abdominal pain. Was able to tolerate some clears for breakfast. Would like her diet advanced.  Objective: Filed Vitals:   08/02/14 1353 08/02/14 1646 08/02/14 2113 08/03/14 0634  BP: 167/83 168/83 134/67 127/66  Pulse: 64 67 70 69  Temp: 98.4 F (36.9 C) 98.5 F (36.9 C) 100.2 F (37.9 C) 99.2 F (37.3 C)  TempSrc:  Oral Oral Oral  Resp: Height:  5' (1.524 m)    Weight:  78.926 kg (174 lb)    SpO2: 100% 97% 98% 97%    Intake/Output Summary (Last 24 hours) at 08/03/14 1311 Last data filed at 08/03/14 0648  Gross per 24 hour  Intake   1320 ml  Output      0 ml  Net   1320 ml   Filed Weights   08/02/14 0905 08/02/14 1646  Weight: 78.926 kg (174 lb) 78.926 kg (174 lb)    Exam:   General:  Alert, awake, oriented 3  Cardiovascular: Regular rate and rhythm, no murmurs, rubs or gallops  Respiratory: Clear to auscultation bilaterally.  Abdomen: Soft, nondistended, positive bowel sounds, tender to palpation to the suprapubic and left lower quadrant.  Extremities: No clubbing, cyanosis or edema,  positive pedal pulses    Neurologgrossly intact and nonfocal.  Data Reviewed: Basic Metabolic Panel:  Recent Labs Lab 08/02/14 1005 08/03/14 0715 08/03/14 0717  NA 136 135  --   K 3.6 3.1*  --   CL 102 102  --   CO2 24 27  --   GLUCOSE 107* 113*  --   BUN 12 7  --   CREATININE 0.57 0.60  --   CALCIUM 9.4 8.8*  --   MG  --   --  1.7   Liver Function Tests:  Recent Labs Lab 08/02/14 1005  AST 20  ALT 23  ALKPHOS 61  BILITOT 0.4  PROT 8.1  ALBUMIN 4.3    Recent Labs Lab 08/02/14 1005  LIPASE 24   No results for input(s): AMMONIA in the last 168 hours. CBC:  Recent Labs Lab 08/02/14 1005 08/03/14 0715  WBC 9.0 10.6*  NEUTROABS 6.8  --   HGB 13.3 12.0  HCT 38.0 35.0*  MCV 85.6 86.4  PLT 322 293   Cardiac Enzymes: No results for input(s): CKTOTAL, CKMB, CKMBINDEX, TROPONINI in the last 168 hours. BNP (last 3 results) No results for input(s): BNP in the last 8760 hours.  ProBNP (last 3 results) No results for input(s): PROBNP in the last 8760 hours.  CBG: No results for input(s): GLUCAP in the last 168 hours.  No results found for this or any previous visit (from the past 240 hour(s)).   Studies: Ct Abdomen Pelvis W  Contrast  08/02/2014   CLINICAL DATA:  Worsening lower abdominal pain for the past 3 days. Vomiting this morning. Loose stools. History of diverticulitis.  EXAM: CT ABDOMEN AND PELVIS WITH CONTRAST  TECHNIQUE: Multidetector CT imaging of the abdomen and pelvis was performed using the standard protocol following bolus administration of intravenous contrast.  CONTRAST:  25mL OMNIPAQUE IOHEXOL 300 MG/ML SOLN, 100mL OMNIPAQUE IOHEXOL 300 MG/ML SOLN  COMPARISON:  06/05/2013.  Pelvic ultrasound dated 04/10/2014.  FINDINGS: Concentric low-density wall thickening with mild pericolonic soft tissue stranding in the mid sigmoid colon in an area of a large number of diverticula. This is causing moderate to marked diffuse luminal narrowing. No proximal  dilatation. These changes were not previously present. There are multiple diverticula elsewhere in the colon.  No evidence of appendicitis. Mildly heterogeneous uterine enhancement, corresponding to the heterogeneity seen on the recent ultrasound. No adnexal masses or enlarged lymph nodes. No gastric or small bowel abnormalities.  The minimal diffuse low density of the liver relative to the spleen. Small right lobe liver cyst. Normal appearing spleen, pancreas, gallbladder, adrenal glands, kidneys and urinary bladder. Clear lung bases. Lower thoracic spine degenerative changes and minimal lumbar spine degenerative changes.  IMPRESSION: 1. Acute diverticulitis involving the mid sigmoid colon without abscess. This is causing moderate to marked concentric luminal narrowing without obstruction. 2. Extensive colonic diverticulosis. 3. Minimal diffuse hepatic steatosis.   Electronically Signed   By: Beckie SaltsSteven  Reid M.D.   On: 08/02/2014 12:12    Scheduled Meds: . ciprofloxacin  400 mg Intravenous Q12H  . heparin  5,000 Units Subcutaneous 3 times per day  . Linaclotide  145 mcg Oral Daily  . metronidazole  500 mg Intravenous Q8H  . potassium chloride  40 mEq Oral Q4H   Continuous Infusions: . sodium chloride 1,000 mL (08/02/14 1737)    Principal Problem:   Acute diverticulitis Active Problems:   Essential hypertension, benign   Acute colitis    Time spent: 25 minutes. Greater than 50% of this time was spent in direct contact with the patient coordinating care.    [name],[name]  [facility] Pager [phone]  If 7PM-7AM, please contact night-coverage at [email or web address], password [facility] 08/03/2014, 1:11 PM  LOS: 1 day

## 2014-08-04 LAB — BASIC METABOLIC PANEL
ANION GAP: 5 (ref 5–15)
BUN: 5 mg/dL — AB (ref 6–20)
CO2: 28 mmol/L (ref 22–32)
CREATININE: 0.59 mg/dL (ref 0.44–1.00)
Calcium: 8.6 mg/dL — ABNORMAL LOW (ref 8.9–10.3)
Chloride: 106 mmol/L (ref 101–111)
GFR calc Af Amer: >60 mL/min (ref >60)
Glucose, Bld: 126 mg/dL — ABNORMAL HIGH (ref 65–99)
Potassium: 3.6 mmol/L (ref 3.5–5.1)
SODIUM: 139 mmol/L (ref 135–145)

## 2014-08-04 LAB — CBC
HEMATOCRIT: 32.8 % — AB (ref 36.0–46.0)
Hemoglobin: 11.1 g/dL — ABNORMAL LOW (ref 12.0–15.0)
MCH: 29.8 pg (ref 26.0–34.0)
MCHC: 33.8 g/dL (ref 30.0–36.0)
MCV: 87.9 fL (ref 78.0–100.0)
PLATELETS: 237 10*3/uL (ref 150–400)
RBC: 3.73 MIL/uL — ABNORMAL LOW (ref 3.87–5.11)
RDW: 12.8 % (ref 11.5–15.5)
WBC: 5.5 10*3/uL (ref 4.0–10.5)

## 2014-08-04 MED ORDER — METRONIDAZOLE 500 MG PO TABS: 500.0000 mg | ORAL_TABLET | Freq: Three times a day (TID) | ORAL | Status: DC

## 2014-08-04 MED ORDER — CIPROFLOXACIN HCL 500 MG PO TABS: 500.0000 mg | ORAL_TABLET | Freq: Two times a day (BID) | ORAL | Status: DC

## 2014-08-04 NOTE — Discharge Summary (Signed)
Physician Discharge Summary  Lisa [name] ONG:[id number] DOB: 1960/09/13 DOA: 08/02/2014  PCP: Willow Ora, PA-C  Admit date: 08/02/2014 Discharge date: 08/04/2014  Time spent: 45 minutes  Recommendations for Outpatient Follow-up:  -Will be discharged home today. -We'll complete a 14 day course of Cipro and Flagyl. -Advised to follow-up with primary care provider in 2 weeks.   Discharge Diagnoses:  Principal Problem:   Acute diverticulitis Active Problems:   Essential hypertension, benign   Acute colitis   Discharge Condition: Stable and improved  Filed Weights   08/02/14 0905 08/02/14 1646  Weight: 78.926 kg (174 lb) 78.926 kg (174 lb)    History of present illness:  Patient is a 54 year old woman with past medical history significant for degenerative disc disease, ischemic cardiomyopathy and hypertension who presents to the hospital with the above-mentioned complaints that began on Wednesday night about a week prior to admission. Symptoms have gotten progressively worse. She has vomited about 10 times in this timeframe and today abdominal pain became unbearable prompting her to seek medical attention. In the emergency department lab work is normal, CT scan is done which shows acute left sigmoid diverticulitis. Because patient has unremitting pain and is unable to tolerate by mouth's she has been referred to [place] for admission for further evaluation and management.  Hospital Course:   Acute uncomplicated sigmoid diverticulitis -Abdominal pain has resolved. -Tolerating solid diet. -Using minimal IV pain medications. -Deemed stable for discharge home today on a 14 day course of Cipro and Flagyl. -Instructions for low residue diet have been provided.  Hypertension -Well-controlled.  Hypokalemia -Repleted, likely due to GI losses.  Procedures:  None   Consultations:  None  Discharge Instructions  Discharge Instructions    Increase activity slowly     Complete by:  As directed             Medication List    STOP taking these medications        aspirin EC 81 MG tablet     DOCUSATE SODIUM PO      TAKE these medications        albuterol 108 (90 BASE) MCG/ACT inhaler  Commonly known as:  PROVENTIL HFA;VENTOLIN HFA  Inhale 2 puffs into the lungs every 6 (six) hours as needed for wheezing.     ciprofloxacin 500 MG tablet  Commonly known as:  CIPRO  Take 1 tablet (500 mg total) by mouth 2 (two) times daily.     hydrochlorothiazide 25 MG tablet  Commonly known as:  HYDRODIURIL  Take 25 mg by mouth every morning.     lactulose 10 GM/15ML solution  Commonly known as:  CHRONULAC  Take 10 g by mouth 2 (two) times daily as needed for mild constipation.     Linaclotide 145 MCG Caps capsule  Commonly known as:  LINZESS  Take 145 mcg by mouth daily.     metroNIDAZOLE 500 MG tablet  Commonly known as:  FLAGYL  Take 1 tablet (500 mg total) by mouth 3 (three) times daily.     simvastatin 20 MG tablet  Commonly known as:  ZOCOR  Take 1 tablet (20 mg total) by mouth at bedtime.       Allergies  Allergen Reactions  . Metoprolol Nausea And Vomiting    Dizziness Elevated BP  . Morphine And Related Hives  . Naproxen Other (See Comments)    Lightheadedness  . Tramadol Nausea And Vomiting  . Ibuprofen Rash  . Penicillins Rash  Follow-up Information    Follow up with [facility]. Schedule an appointment as soon as possible for a visit in 2 weeks.   Why:  with your regular provider   Contact information:   [address] [place] [address] [phone]        The results of significant diagnostics from this hospitalization (including imaging, microbiology, ancillary and laboratory) are listed below for reference.    Significant Diagnostic Studies: Ct Abdomen Pelvis W Contrast  08/02/2014   CLINICAL DATA:  Worsening lower abdominal pain for the past 3 days. Vomiting this morning. Loose stools.  History of diverticulitis.  EXAM: CT ABDOMEN AND PELVIS WITH CONTRAST  TECHNIQUE: Multidetector CT imaging of the abdomen and pelvis was performed using the standard protocol following bolus administration of intravenous contrast.  CONTRAST:  25mL OMNIPAQUE IOHEXOL 300 MG/ML SOLN, OMNIPAQUE IOHEXOL 300 MG/ML SOLN  COMPARISON:  06/05/2013.  Pelvic ultrasound dated 04/10/2014.  FINDINGS: Concentric low-density wall thickening with mild pericolonic soft tissue stranding in the mid sigmoid colon in an area of a large number of diverticula. This is causing moderate to marked diffuse luminal narrowing. No proximal dilatation. These changes were not previously present. There are multiple diverticula elsewhere in the colon.  No evidence of appendicitis. Mildly heterogeneous uterine enhancement, corresponding to the heterogeneity seen on the recent ultrasound. No adnexal masses or enlarged lymph nodes. No gastric or small bowel abnormalities.  The minimal diffuse low density of the liver relative to the spleen. Small right lobe liver cyst. Normal appearing spleen, pancreas, gallbladder, adrenal glands, kidneys and urinary bladder. Clear lung bases. Lower thoracic spine degenerative changes and minimal lumbar spine degenerative changes.  IMPRESSION: 1. Acute diverticulitis involving the mid sigmoid colon without abscess. This is causing moderate to marked concentric luminal narrowing without obstruction. 2. Extensive colonic diverticulosis. 3. Minimal diffuse hepatic steatosis.   Electronically Signed   By: Beckie Salts M.D.   On: 08/02/2014 12:12    Microbiology: No results found for this or any previous visit (from the past 240 hour(s)).   Labs: Basic Metabolic Panel:  Recent Labs Lab 08/02/14 1005 08/03/14 0715 08/03/14 0717 08/04/14 0526  NA 136 135  --  139  K 3.6 3.1*  --  3.6  CL 102 102  --  106  CO2 24 27  --  28  GLUCOSE 107* 113*  --  126*  BUN 12 7  --  5*  CREATININE 0.57 0.60  --  0.59   CALCIUM 9.4 8.8*  --  8.6*  MG  --   --  1.7  --    Liver Function Tests:  Recent Labs Lab 08/02/14 1005  AST 20  ALT 23  ALKPHOS 61  BILITOT 0.4  PROT 8.1  ALBUMIN 4.3    Recent Labs Lab 08/02/14 1005  LIPASE 24   No results for input(s): AMMONIA in the last 168 hours. CBC:  Recent Labs Lab 08/02/14 1005 08/03/14 0715 08/04/14 0526  WBC 9.0 10.6* 5.5  NEUTROABS 6.8  --   --   HGB 13.3 12.0 11.1*  HCT 38.0 35.0* 32.8*  MCV 85.6 86.4 87.9  PLT 322 293 237   Cardiac Enzymes: No results for input(s): CKTOTAL, CKMB, CKMBINDEX, TROPONINI in the last 168 hours. BNP: BNP (last 3 results) No results for input(s): BNP in the last 8760 hours.  ProBNP (last 3 results) No results for input(s): PROBNP in the last 8760 hours.  CBG: No results for input(s):  GLUCAP in the last 168 hours.     SignedChaya Jan:  HERNANDEZ ACOSTA,[name]  [facility] Pager: [phone] 08/04/2014, 3:18 PM

## 2014-08-04 NOTE — Progress Notes (Signed)
Discharge instructions given on medications,and follow up visits,patient verbalized understanding. Prescriptions sent with patient. No c/o pain or discomfort noted. Accompanied by staff to an awaiting vehicle 

## 2014-08-04 NOTE — Care Management Note (Signed)
Case Management Note  Patient Details  Name: Lisa [name] MRN: [id number] Date of Birth: 1960/12/24  Expected Discharge Date:                  Expected Discharge Plan:  Home w Home Health Services  In-House Referral:  Financial Counselor  Discharge planning Services  CM Consult  Post Acute Care Choice:  NA Choice offered to:  NA  DME Arranged:    DME Agency:     HH Arranged:    HH Agency:     Status of Service:  Completed, signed off  Medicare Important Message Given:  N/A - LOS <3 / Initial given by admissions Date Medicare IM Given:    Medicare IM give by:    Date Additional Medicare IM Given:    Additional Medicare Important Message give by:     If discussed at Long Length of Stay Meetings, dates discussed:    Additional Comments: Pt discharging home today with self care. Pt given MATCH voucher for prescribed meds. [facility] No further CM needs at the time of DC.   Malcolm Metrohildress, Willford Rabideau Demske, RN 08/04/2014, 1:07 PM

## 2014-08-25 ENCOUNTER — Encounter: Payer: Self-pay | Admitting: Gastroenterology

## 2014-08-25 ENCOUNTER — Ambulatory Visit (INDEPENDENT_AMBULATORY_CARE_PROVIDER_SITE_OTHER): Payer: Self-pay | Admitting: Gastroenterology

## 2014-08-25 VITALS — BP 155/89 | HR 71 | Temp 98.0°F | Ht 60.0 in | Wt 181.2 lb

## 2014-08-25 DIAGNOSIS — K5732 Diverticulitis of large intestine without perforation or abscess without bleeding: Secondary | ICD-10-CM | POA: Insufficient documentation

## 2014-08-25 NOTE — Patient Instructions (Signed)
1. Please complete patient assistance forms for Linzess. Samples provided today as well. 2. We will request your prior colonoscopy report.  3. I will discuss your case with Dr. Darrick Penna. You may need to see a surgeon for elective partial colon resection.

## 2014-08-25 NOTE — Progress Notes (Signed)
Primary Care Physician: Alda Lea  Primary Gastroenterologist:  Jonette Eva, MD   Chief Complaint  Patient presents with  . Follow-up    HPI: Lisa [name] is a 54 y.o. female here for follow up. Seen in March 2016 for change in bowel habits/constipation, rectal bleeding, mid to lower abdominal pain. Symptoms for a couple of months. Colonoscopy in March showed moderate diverticulosis in the sigmoid colon, descending colon, transverse colon with associated angulation, muscular atrophy and colonic narrowing. 2 rectal polyps removed. She large external and small internal hemorrhoids. Polyps were hyperplastic. Recommended next colonoscopy in 10 years with an overtube. Patient denies polyps on previous colonoscopies in the past.  Since her last office visit she developed worsening abdominal pain was admitted with acute diverticulitis. Stayed for 2 days, received IV antibiotics, Cipro and Flagyl. Completed 14 day course.  States she is feeling better. That made her third hospitalization for diverticulitis since 2013. Hospitalized twice in [place] in 2013 with diverticulitis. Reports multiple ER visits and multiple rounds of an biotic therapy for diverticulitis. Reports that her maternal grandfather died of diverticulitis at age 37. She is not aware of the details.  Trying to manage her disease with her diet. Quit gluten one year ago because of bloating and constipation. Consumes fish once per week but afraid of mercury. Meat hurts her stomach so she avoids. Following predominantly of vegan diet at this point. Raisins or source of her iron.  Chronic constipation. Currently having a bowel movement couple times per day with dietary measures. Denies blood in the stool. Was not able to afford the Linzess but it helped a whole lot. Abdominal pain subsiding. Appetite improving.  If she requires surgery for diverticulitis she would like for [place] to speak to her brother Ander Slade,  nurse practitioner in urgent care in [place], [phone]  Prefers surgery and reasonable if necessary. She has no car.  Current Outpatient Prescriptions  Medication Sig Dispense Refill  . albuterol (PROVENTIL HFA;VENTOLIN HFA) 108 (90 BASE) MCG/ACT inhaler Inhale 2 puffs into the lungs every 6 (six) hours as needed for wheezing.    . lactulose (CHRONULAC) 10 GM/15ML solution Take 10 g by mouth 2 (two) times daily as needed for mild constipation.    . Linaclotide (LINZESS) 145 MCG CAPS capsule Take 145 mcg by mouth daily.    . Probiotic Product (PROBIOTIC DAILY PO) Take by mouth.    . simvastatin (ZOCOR) 20 MG tablet Take 1 tablet (20 mg total) by mouth at bedtime. 30 tablet 9  . hydrochlorothiazide (HYDRODIURIL) 25 MG tablet Take 25 mg by mouth every morning.      No current facility-administered medications for this visit.    Allergies as of 08/25/2014 - Review Complete 08/25/2014  Allergen Reaction Noted  . Metoprolol Nausea And Vomiting 05/27/2013  . Morphine and related Hives 02/14/2012  . Naproxen Other (See Comments) 02/14/2012  . Tramadol Nausea And Vomiting 09/15/2013  . Ibuprofen Rash 02/14/2012  . Penicillins Rash 02/14/2012    ROS:  General: Negative for anorexia, weight loss, fever, chills, fatigue, weakness. ENT: Negative for hoarseness, difficulty swallowing , nasal congestion. CV: Negative for chest pain, angina, palpitations, dyspnea on exertion, peripheral edema.  Respiratory: Negative for dyspnea at rest, dyspnea on exertion, cough, sputum, wheezing.  GI: See history of present illness. GU:  Negative for dysuria, hematuria, urinary incontinence, urinary frequency, nocturnal urination.  Endo: Negative for unusual weight change.    Physical Examination:   BP  155/89 mmHg  Pulse 71  Temp(Src) 98 F (36.7 C) (Oral)  Ht 5' (1.524 m)  Wt 181 lb 3.2 oz (82.192 kg)  BMI 35.39 kg/m2  LMP 12/26/2011  General: Well-nourished, well-developed in no acute  distress.  Eyes: No icterus. Mouth: Oropharyngeal mucosa moist and pink , no lesions erythema or exudate. Lungs: Clear to auscultation bilaterally.  Heart: Regular rate and rhythm, no murmurs rubs or gallops.  Abdomen: Bowel sounds are normal, nontender, nondistended, no hepatosplenomegaly or masses, no abdominal bruits or hernia , no rebound or guarding.   Extremities: No lower extremity edema. No clubbing or deformities. Neuro: Alert and oriented x 4   Skin: Warm and dry, no jaundice.   Psych: Alert and cooperative, normal mood and affect.  Labs:  Lab Results  Component Value Date   CREATININE 0.59 08/04/2014   BUN 5* 08/04/2014   NA 139 08/04/2014   K 3.6 08/04/2014   CL 106 08/04/2014   CO2 28 08/04/2014   Lab Results  Component Value Date   ALT 23 08/02/2014   AST 20 08/02/2014   ALKPHOS 61 08/02/2014   BILITOT 0.4 08/02/2014   Lab Results  Component Value Date   WBC 5.5 08/04/2014   HGB 11.1* 08/04/2014   HCT 32.8* 08/04/2014   MCV 87.9 08/04/2014   PLT 237 08/04/2014   Component     Latest Ref Rng 08/02/2014 08/03/2014 08/04/2014  Hemoglobin     12.0 - 15.0 g/dL 16.1 09.6 04.5 (L)  HCT     36.0 - 46.0 % 38.0 35.0 (L) 32.8 (L)  Likely drop due to dilution during hospitalization.   Imaging Studies: Ct Abdomen Pelvis W Contrast  08/02/2014   CLINICAL DATA:  Worsening lower abdominal pain for the past 3 days. Vomiting this morning. Loose stools. History of diverticulitis.  EXAM: CT ABDOMEN AND PELVIS WITH CONTRAST  TECHNIQUE: Multidetector CT imaging of the abdomen and pelvis was performed using the standard protocol following bolus administration of intravenous contrast.  CONTRAST:  22mL OMNIPAQUE IOHEXOL 300 MG/ML SOLN, OMNIPAQUE IOHEXOL 300 MG/ML SOLN  COMPARISON:  06/05/2013.  Pelvic ultrasound dated 04/10/2014.  FINDINGS: Concentric low-density wall thickening with mild pericolonic soft tissue stranding in the mid sigmoid colon in an area of a large number of  diverticula. This is causing moderate to marked diffuse luminal narrowing. No proximal dilatation. These changes were not previously present. There are multiple diverticula elsewhere in the colon.  No evidence of appendicitis. Mildly heterogeneous uterine enhancement, corresponding to the heterogeneity seen on the recent ultrasound. No adnexal masses or enlarged lymph nodes. No gastric or small bowel abnormalities.  The minimal diffuse low density of the liver relative to the spleen. Small right lobe liver cyst. Normal appearing spleen, pancreas, gallbladder, adrenal glands, kidneys and urinary bladder. Clear lung bases. Lower thoracic spine degenerative changes and minimal lumbar spine degenerative changes.  IMPRESSION: 1. Acute diverticulitis involving the mid sigmoid colon without abscess. This is causing moderate to marked concentric luminal narrowing without obstruction. 2. Extensive colonic diverticulosis. 3. Minimal diffuse hepatic steatosis.   Electronically Signed   By: Beckie Salts M.D.   On: 08/02/2014 12:12

## 2014-08-26 NOTE — Progress Notes (Signed)
cc'd to pcp 

## 2014-08-26 NOTE — Assessment & Plan Note (Addendum)
Recurrent acute diverticulitis. 3 hospitalizations since 2013. Patient reports multiple ER visits and antibiotics also. Recent colonoscopy demonstrated moderate diverticulosis of the sigmoid colon, descending colon, transverse colon with associated angulation, muscular hypertrophy, colonic narrowing. To discuss further with Dr. Darrick Penna, may consider elective partial colectomy.   For chronic constipation, resume Linzess daily. Samples and patient assistance forms provided.

## 2014-08-26 NOTE — Progress Notes (Addendum)
REVIEWED. OBTAIN CT REPORTS FROM [place]. ONE DOCUMENTED 1 OF 3 CTs WITH [place] DIVERTICULITIS SINCE 2013. IF DIVERTICULITIS CONFIRMED THEN WOULD REFER TO SURGERY.

## 2014-08-27 NOTE — Progress Notes (Signed)
All records from [facility] have been requested.

## 2014-08-27 NOTE — Progress Notes (Signed)
Please request copy of all CTs of abd done in [place] since 2012 to present.

## 2014-09-09 ENCOUNTER — Telehealth: Payer: Self-pay | Admitting: General Practice

## 2014-09-09 NOTE — Telephone Encounter (Signed)
Patient called in and wanted us to know she is going to fax in the neccessary paperwork needed for her financial assistance application for her medication.  I gave the patient our fax number and told her to make sure she makes it to Hendricks LimesJulie Lawson attention.

## 2014-09-27 NOTE — Progress Notes (Signed)
Records received. CT 12/10/09 [place] with mild sigmoid colon diverticulitis.  Colonoscopy 12/03/09 [place]: multiple small and large-mouted diverticula sigmoid to ascending colon. Random bx neg. TI normal. Hemorrhagic and inflammed mucosa in mid sigmoid colon.   Unfortunately we did not received any other records of hospitalizations since 2013 as reported by patient.   Recommend OV follow up in 12/2014 with [facility].

## 2014-09-29 NOTE — Progress Notes (Signed)
REMINDER IN EPIC AND RELEASE IS UP FRONT WHENEVER SHE COMES TO SIGN

## 2014-09-29 NOTE — Progress Notes (Signed)
Called and informed pt. She said that [name] [place], [facility]. I asked her had she signed release for it before and she said NO. She will come by the office at her convenience and sign release for that.

## 2014-10-05 ENCOUNTER — Emergency Department (HOSPITAL_COMMUNITY)
Admission: EM | Admit: 2014-10-05 | Discharge: 2014-10-05 | Disposition: A | Discharge status: Home / Self Care | Payer: Self-pay | Attending: Emergency Medicine | Admitting: Emergency Medicine

## 2014-10-05 ENCOUNTER — Encounter (HOSPITAL_COMMUNITY): Payer: Self-pay | Admitting: *Deleted

## 2014-10-05 ENCOUNTER — Emergency Department (HOSPITAL_COMMUNITY): Payer: Self-pay

## 2014-10-05 DIAGNOSIS — Z8673 Personal history of transient ischemic attack (TIA), and cerebral infarction without residual deficits: Secondary | ICD-10-CM | POA: Insufficient documentation

## 2014-10-05 DIAGNOSIS — Z87448 Personal history of other diseases of urinary system: Secondary | ICD-10-CM | POA: Insufficient documentation

## 2014-10-05 DIAGNOSIS — J45909 Unspecified asthma, uncomplicated: Secondary | ICD-10-CM | POA: Insufficient documentation

## 2014-10-05 DIAGNOSIS — E871 Hypo-osmolality and hyponatremia: Secondary | ICD-10-CM | POA: Insufficient documentation

## 2014-10-05 DIAGNOSIS — R112 Nausea with vomiting, unspecified: Secondary | ICD-10-CM

## 2014-10-05 DIAGNOSIS — Z7982 Long term (current) use of aspirin: Secondary | ICD-10-CM | POA: Insufficient documentation

## 2014-10-05 DIAGNOSIS — Z8719 Personal history of other diseases of the digestive system: Secondary | ICD-10-CM | POA: Insufficient documentation

## 2014-10-05 DIAGNOSIS — Z79899 Other long term (current) drug therapy: Secondary | ICD-10-CM | POA: Insufficient documentation

## 2014-10-05 DIAGNOSIS — Z880 Allergy status to penicillin: Secondary | ICD-10-CM | POA: Insufficient documentation

## 2014-10-05 DIAGNOSIS — E876 Hypokalemia: Secondary | ICD-10-CM | POA: Insufficient documentation

## 2014-10-05 DIAGNOSIS — Z780 Asymptomatic menopausal state: Secondary | ICD-10-CM | POA: Insufficient documentation

## 2014-10-05 DIAGNOSIS — M1990 Unspecified osteoarthritis, unspecified site: Secondary | ICD-10-CM | POA: Insufficient documentation

## 2014-10-05 DIAGNOSIS — E785 Hyperlipidemia, unspecified: Secondary | ICD-10-CM | POA: Insufficient documentation

## 2014-10-05 DIAGNOSIS — Z8739 Personal history of other diseases of the musculoskeletal system and connective tissue: Secondary | ICD-10-CM | POA: Insufficient documentation

## 2014-10-05 DIAGNOSIS — Z87891 Personal history of nicotine dependence: Secondary | ICD-10-CM | POA: Insufficient documentation

## 2014-10-05 DIAGNOSIS — I252 Old myocardial infarction: Secondary | ICD-10-CM | POA: Insufficient documentation

## 2014-10-05 DIAGNOSIS — I10 Essential (primary) hypertension: Secondary | ICD-10-CM | POA: Insufficient documentation

## 2014-10-05 DIAGNOSIS — G8929 Other chronic pain: Secondary | ICD-10-CM | POA: Insufficient documentation

## 2014-10-05 LAB — COMPREHENSIVE METABOLIC PANEL
ALK PHOS: 58 U/L (ref 38–126)
ALT: 33 U/L (ref 14–54)
AST: 22 U/L (ref 15–41)
Albumin: 4.5 g/dL (ref 3.5–5.0)
Anion gap: 13 (ref 5–15)
BUN: 15 mg/dL (ref 6–20)
CO2: 30 mmol/L (ref 22–32)
Calcium: 9.4 mg/dL (ref 8.9–10.3)
Chloride: 86 mmol/L — ABNORMAL LOW (ref 101–111)
Creatinine, Ser: 0.65 mg/dL (ref 0.44–1.00)
GFR calc non Af Amer: >60 mL/min (ref >60)
Glucose, Bld: 115 mg/dL — ABNORMAL HIGH (ref 65–99)
POTASSIUM: 2.7 mmol/L — AB (ref 3.5–5.1)
Sodium: 129 mmol/L — ABNORMAL LOW (ref 135–145)
Total Bilirubin: 0.6 mg/dL (ref 0.3–1.2)
Total Protein: 8.2 g/dL — ABNORMAL HIGH (ref 6.5–8.1)

## 2014-10-05 LAB — CBC
HEMATOCRIT: 39.6 % (ref 36.0–46.0)
Hemoglobin: 14.4 g/dL (ref 12.0–15.0)
MCH: 30 pg (ref 26.0–34.0)
MCHC: 36.4 g/dL — AB (ref 30.0–36.0)
MCV: 82.5 fL (ref 78.0–100.0)
Platelets: 286 10*3/uL (ref 150–400)
RBC: 4.8 MIL/uL (ref 3.87–5.11)
RDW: 12.2 % (ref 11.5–15.5)
WBC: 6.5 10*3/uL (ref 4.0–10.5)

## 2014-10-05 LAB — URINALYSIS, ROUTINE W REFLEX MICROSCOPIC
BILIRUBIN URINE: NEGATIVE
GLUCOSE, UA: NEGATIVE mg/dL
Ketones, ur: NEGATIVE mg/dL
Leukocytes, UA: NEGATIVE
Nitrite: NEGATIVE
PH: 6 (ref 5.0–8.0)
Protein, ur: NEGATIVE mg/dL
SPECIFIC GRAVITY, URINE: 1.01 (ref 1.005–1.030)
UROBILINOGEN UA: 0.2 mg/dL (ref 0.0–1.0)

## 2014-10-05 LAB — LIPASE, BLOOD: LIPASE: 97 U/L — AB (ref 22–51)

## 2014-10-05 LAB — URINE MICROSCOPIC-ADD ON

## 2014-10-05 MED ORDER — PROMETHAZINE HCL 25 MG PO TABS: 25.0000 mg | ORAL_TABLET | Freq: Four times a day (QID) | ORAL | Status: DC | PRN

## 2014-10-05 MED ORDER — ONDANSETRON HCL 4 MG PO TABS: 4.0000 mg | ORAL_TABLET | Freq: Three times a day (TID) | ORAL | Status: DC | PRN

## 2014-10-05 MED ORDER — POTASSIUM CHLORIDE ER 10 MEQ PO TBCR
10.0000 meq | EXTENDED_RELEASE_TABLET | Freq: Every day | ORAL | Status: DC
Start: 2014-10-05 — End: 2015-06-30

## 2014-10-05 MED ORDER — MAGNESIUM SULFATE 2 GM/50ML IV SOLN
2.0000 g | INTRAVENOUS | Status: AC
  Administered 2014-10-05: 2 g via INTRAVENOUS
  Filled 2014-10-05: qty 50

## 2014-10-05 MED ORDER — POTASSIUM CHLORIDE 10 MEQ/100ML IV SOLN
10.0000 meq | Freq: Once | INTRAVENOUS | Status: AC
  Administered 2014-10-05: 10 meq via INTRAVENOUS
  Filled 2014-10-05: qty 100

## 2014-10-05 MED ORDER — ONDANSETRON HCL 4 MG/2ML IJ SOLN
4.0000 mg | Freq: Once | INTRAMUSCULAR | Status: AC
  Administered 2014-10-05: 4 mg via INTRAVENOUS
  Filled 2014-10-05: qty 2

## 2014-10-05 MED ORDER — POTASSIUM CHLORIDE CRYS ER 20 MEQ PO TBCR
40.0000 meq | EXTENDED_RELEASE_TABLET | Freq: Once | ORAL | Status: AC
  Administered 2014-10-05: 40 meq via ORAL
  Filled 2014-10-05: qty 2

## 2014-10-05 MED ORDER — SODIUM CHLORIDE 0.9 % IV BOLUS (SEPSIS)
1000.0000 mL | Freq: Once | INTRAVENOUS | Status: AC
  Administered 2014-10-05: 1000 mL via INTRAVENOUS

## 2014-10-05 NOTE — Discharge Instructions (Signed)

## 2014-10-05 NOTE — ED Notes (Signed)
Gave patient glass of sprite as per doctor. JGW

## 2014-10-05 NOTE — ED Notes (Signed)
Pt states she has trouble getting her words out at times since Thursday per pt, no slurred speech noted in triage

## 2014-10-05 NOTE — ED Notes (Signed)
Pt with emesis and dizziness since Thursday, denies seeing anyone for it, c/o HA

## 2014-10-05 NOTE — ED Notes (Signed)
Patient ambulated to the bathroom with no assistance. Asked for drink after she returned was given sprite with ice as per doctor. Patient had no problems walking.

## 2014-10-05 NOTE — ED Provider Notes (Signed)
CSN: [id number]     Arrival date & time 10/05/14  1417 History   First MD Initiated Contact with Patient 10/05/14 1644     Chief Complaint  Patient presents with  . Emesis     (Consider location/radiation/quality/duration/timing/severity/associated sxs/prior Treatment) HPI Comments: 54 year old female, she has a history of prior stroke, prior diverticulitis, hypertension she states that over the last several days she has had increasing headache, nausea, feeling like her words are difficult to get out from time to time but denies any weakness in the arms or the legs or any difficulty with coordination or vision. She has some generalized weakness. This is gradually worsening, nothing makes this better or worse, she has had very little to eat or drink because of nausea and recurrent vomiting. She denies abdominal pain  Patient is a 54 y.o. female presenting with vomiting. The history is provided by the patient.  Emesis   Past Medical History  Diagnosis Date  . Diverticulitis   . Arthritis   . Asthma   . Mixed hyperlipidemia   . Essential hypertension, benign   . Tendinitis     Patient reports chronic problem involving her arms and hands  . Chronic back pain     Patient reports lower back disc problems  . Myocardial infarction     Patient reports related to "steroids" in 1985 ([place])  . High blood pressure   . High cholesterol   . Stroke   . Degenerative lumbar disc   . Bursitis   . PMB (postmenopausal bleeding) 03/18/2014  . Enlarged uterus 03/18/2014  . Mixed stress and urge urinary incontinence 03/18/2014  . GERD (gastroesophageal reflux disease)    Past Surgical History  Procedure Laterality Date  . Cesarean section    . Tonsillectomy    . Adnoids    . Colonoscopy N/A 06/04/2014    SLF: 1. moderate diverticulosis in teh sigmoid colon, descending colon, and transverse colon. 2. Two colon polyps removed 3. Large external and small internal hemorroids.    Family History   Problem Relation Age of Onset  . Adopted: Yes  . Other Mother     aneursym  . Other Maternal Uncle     stenosis  . Arthritis Maternal Uncle   . Angina Son   . ADD / ADHD Son   . Other Son     food allergies  . Blindness Daughter     from birth; brain damage  . Drug abuse Sister   . Heart attack Paternal Aunt   . Alcohol abuse Cousin   . Drug abuse Cousin    History  Substance Use Topics  . Smoking status: Former Smoker -- 2.00 packs/day for 12 years    Types: Cigarettes    Quit date: 03/13/1985  . Smokeless tobacco: Never Used  . Alcohol Use: No   OB History    Gravida Para Term Preterm AB TAB SAB Ectopic Multiple Living   8 8             Review of Systems  Gastrointestinal: Positive for vomiting.  All other systems reviewed and are negative.     Allergies  Metoprolol; Morphine and related; Naproxen; Tramadol; Ibuprofen; and Penicillins  Home Medications   Prior to Admission medications   Medication Sig Start Date End Date Taking? Authorizing Provider  albuterol (PROVENTIL HFA;VENTOLIN HFA) 108 (90 BASE) MCG/ACT inhaler Inhale 2 puffs into the lungs every 6 (six) hours as needed for wheezing.   Yes Historical Provider, MD  aspirin EC 81 MG tablet Take 81 mg by mouth daily.   Yes Historical Provider, MD  DimenhyDRINATE (EQ MOTION SICKNESS PO) Apply 1 application topically as needed (for nausea/vomiting).   Yes Historical Provider, MD  hydrochlorothiazide (HYDRODIURIL) 25 MG tablet Take 25 mg by mouth every morning.    Yes Historical Provider, MD  lactulose (CHRONULAC) 10 GM/15ML solution Take 10 g by mouth 2 (two) times daily as needed for mild constipation.   Yes Historical Provider, MD  Linaclotide Karlene Einstein) 145 MCG CAPS capsule Take 145 mcg by mouth daily as needed (for constipation).    Yes Historical Provider, MD  simvastatin (ZOCOR) 20 MG tablet Take 1 tablet (20 mg total) by mouth at bedtime. 11/24/13  Yes Jonelle Sidle, MD  ondansetron (ZOFRAN) 4 MG  tablet Take 1 tablet (4 mg total) by mouth every 8 (eight) hours as needed for nausea or vomiting. 10/05/14   Eber Hong, MD  potassium chloride (K-DUR) 10 MEQ tablet Take 1 tablet (10 mEq total) by mouth daily. 10/05/14   Eber Hong, MD  promethazine (PHENERGAN) 25 MG tablet Take 1 tablet (25 mg total) by mouth every 6 (six) hours as needed for nausea or vomiting. 10/05/14   Eber Hong, MD   BP 135/76 mmHg  Pulse 56  Temp(Src) 97.9 F (36.6 C) (Oral)  Resp 15  Ht 5' (1.524 m)  Wt 177 lb 6.4 oz (80.468 kg)  BMI 34.65 kg/m2  SpO2 99%  LMP 12/26/2011 Physical Exam  Constitutional: She appears well-developed and well-nourished. No distress.  HENT:  Head: Normocephalic and atraumatic.  Mouth/Throat: Oropharynx is clear and moist. No oropharyngeal exudate.  Eyes: Conjunctivae and EOM are normal. Pupils are equal, round, and reactive to light. Right eye exhibits no discharge. Left eye exhibits no discharge. No scleral icterus.  Neck: Normal range of motion. Neck supple. No JVD present. No thyromegaly present.  Cardiovascular: Normal rate, regular rhythm, normal heart sounds and intact distal pulses.  Exam reveals no gallop and no friction rub.   No murmur heard. Pulmonary/Chest: Effort normal and breath sounds normal. No respiratory distress. She has no wheezes. She has no rales.  Abdominal: Soft. Bowel sounds are normal. She exhibits no distension and no mass.  Musculoskeletal: Normal range of motion. She exhibits no edema or tenderness.  Lymphadenopathy:    She has no cervical adenopathy.  Neurological: She is alert. Coordination normal.  Speech is clear, cranial nerves III through XII are intact, memory is intact, strength is normal in all 4 extremities including grips, sensation is intact to light touch and pinprick in all 4 extremities. Coordination as tested by finger-nose-finger is normal, no limb ataxia. Normal gait, normal reflexes at the patellar tendons bilaterally  Skin: Skin  is warm and dry. No rash noted. No erythema.  Psychiatric: She has a normal mood and affect. Her behavior is normal.  Nursing note and vitals reviewed.   ED Course  Procedures (including critical care time) Labs Review Labs Reviewed  LIPASE, BLOOD - Abnormal; Notable for the following:    Lipase 97 (*)    All other components within normal limits  COMPREHENSIVE METABOLIC PANEL - Abnormal; Notable for the following:    Sodium 129 (*)    Potassium 2.7 (*)    Chloride 86 (*)    Glucose, Bld 115 (*)    Total Protein 8.2 (*)    All other components within normal limits  CBC - Abnormal; Notable for the following:    MCHC 36.4 (*)  All other components within normal limits  URINALYSIS, ROUTINE W REFLEX MICROSCOPIC (NOT AT [facility]) - Abnormal; Notable for the following:    Color, Urine STRAW (*)    Hgb urine dipstick TRACE (*)    All other components within normal limits  URINE MICROSCOPIC-ADD ON    Imaging Review No results found.    MDM   Final diagnoses:  Hypokalemia  Hyponatremia  Non-intractable vomiting with nausea, vomiting of unspecified type    Vital signs are unremarkable, labs are abnormal with low sodium, low potassium, slightly elevated lipase but normal blood counts. Check urinalysis, fluids, Zofran, replace potassium with magnesium as well. The patient is in agreement with the plan. She has no tenderness in her abdomen. Lungs are clear, heart is regular, no tachycardia hypotension or fevers. Neurologic exam is unremarkable, doubt new stroke  The patient was informed of her findings, she has been given replacement potassium, IV fluids as well as antiemetics. She has not been able to ambulate to the bathroom and back without any difficulty, there is no ataxia or weakness, she continues to have totally normal speech, there has been no slurred speech recognized. Her vital signs remain normal, she is not hypertensive nor she febrile. There is no indication for  neuroimaging at this time as the patient has improved significantly and is amenable to discharge with ongoing potassium supplementation and close follow-up for recheck within 2 weeks. I have encouraged the patient to come back for any other focal neurologic deficits that would suggest a neurologic source. She has expressed her understanding.  Meds given in ED:  Medications  magnesium sulfate IVPB 2 g 50 mL (0 g Intravenous Stopped 10/05/14 1854)  potassium chloride 10 mEq in 100 mL IVPB (0 mEq Intravenous Stopped 10/05/14 1854)  potassium chloride SA (K-DUR,KLOR-CON) CR tablet 40 mEq (40 mEq Oral Given 10/05/14 1737)  ondansetron (ZOFRAN) injection 4 mg (4 mg Intravenous Given 10/05/14 1737)  sodium chloride 0.9 % bolus 1,000 mL (0 mLs Intravenous Stopped 10/05/14 1854)    New Prescriptions   ONDANSETRON (ZOFRAN) 4 MG TABLET    Take 1 tablet (4 mg total) by mouth every 8 (eight) hours as needed for nausea or vomiting.   POTASSIUM CHLORIDE (K-DUR) 10 MEQ TABLET    Take 1 tablet (10 mEq total) by mouth daily.   PROMETHAZINE (PHENERGAN) 25 MG TABLET    Take 1 tablet (25 mg total) by mouth every 6 (six) hours as needed for nausea or vomiting.      Eber Hong, MD 10/05/14 [phone]

## 2014-10-05 NOTE — ED Notes (Signed)
Patient verbalizes understanding of discharge instructions, prescription medications, home care and follow up care. Patient ambulatory out of department at this time with family. 

## 2014-10-05 NOTE — ED Notes (Signed)
Warm blanket given per request

## 2014-10-05 NOTE — ED Notes (Signed)
CRITICAL VALUE ALERT  Critical value received:  POTASSIUM 2.7  Date of notification:  10/05/14  Time of notification:  1635  Critical value read back:Yes.    Nurse who received alert: Viviano Simas, RN  MD notified (1st page):  Hyacinth Meeker,

## 2014-11-18 ENCOUNTER — Encounter (HOSPITAL_COMMUNITY): Payer: Self-pay | Admitting: Emergency Medicine

## 2014-11-18 ENCOUNTER — Emergency Department (HOSPITAL_COMMUNITY): Payer: Self-pay

## 2014-11-18 ENCOUNTER — Emergency Department (HOSPITAL_COMMUNITY)
Admission: EM | Admit: 2014-11-18 | Discharge: 2014-11-18 | Disposition: A | Discharge status: Home / Self Care | Payer: Self-pay | Attending: Emergency Medicine | Admitting: Emergency Medicine

## 2014-11-18 DIAGNOSIS — K5909 Other constipation: Secondary | ICD-10-CM | POA: Insufficient documentation

## 2014-11-18 DIAGNOSIS — Z79899 Other long term (current) drug therapy: Secondary | ICD-10-CM | POA: Insufficient documentation

## 2014-11-18 DIAGNOSIS — J45909 Unspecified asthma, uncomplicated: Secondary | ICD-10-CM | POA: Insufficient documentation

## 2014-11-18 DIAGNOSIS — Z8673 Personal history of transient ischemic attack (TIA), and cerebral infarction without residual deficits: Secondary | ICD-10-CM | POA: Insufficient documentation

## 2014-11-18 DIAGNOSIS — E780 Pure hypercholesterolemia: Secondary | ICD-10-CM | POA: Insufficient documentation

## 2014-11-18 DIAGNOSIS — I252 Old myocardial infarction: Secondary | ICD-10-CM | POA: Insufficient documentation

## 2014-11-18 DIAGNOSIS — I10 Essential (primary) hypertension: Secondary | ICD-10-CM | POA: Insufficient documentation

## 2014-11-18 DIAGNOSIS — Z87891 Personal history of nicotine dependence: Secondary | ICD-10-CM | POA: Insufficient documentation

## 2014-11-18 DIAGNOSIS — E785 Hyperlipidemia, unspecified: Secondary | ICD-10-CM | POA: Insufficient documentation

## 2014-11-18 DIAGNOSIS — Z7982 Long term (current) use of aspirin: Secondary | ICD-10-CM | POA: Insufficient documentation

## 2014-11-18 DIAGNOSIS — M1990 Unspecified osteoarthritis, unspecified site: Secondary | ICD-10-CM | POA: Insufficient documentation

## 2014-11-18 DIAGNOSIS — Z8742 Personal history of other diseases of the female genital tract: Secondary | ICD-10-CM | POA: Insufficient documentation

## 2014-11-18 DIAGNOSIS — G8929 Other chronic pain: Secondary | ICD-10-CM | POA: Insufficient documentation

## 2014-11-18 LAB — URINALYSIS, ROUTINE W REFLEX MICROSCOPIC
Bilirubin Urine: NEGATIVE
GLUCOSE, UA: NEGATIVE mg/dL
Ketones, ur: NEGATIVE mg/dL
Leukocytes, UA: NEGATIVE
Nitrite: NEGATIVE
PROTEIN: NEGATIVE mg/dL
Specific Gravity, Urine: 1.025 (ref 1.005–1.030)
Urobilinogen, UA: 0.2 mg/dL (ref 0.0–1.0)
pH: 5.5 (ref 5.0–8.0)

## 2014-11-18 LAB — CBC WITH DIFFERENTIAL/PLATELET
BASOS ABS: 0 10*3/uL (ref 0.0–0.1)
Basophils Relative: 0 % (ref 0–1)
Eosinophils Absolute: 0.1 10*3/uL (ref 0.0–0.7)
Eosinophils Relative: 1 % (ref 0–5)
HEMATOCRIT: 37.6 % (ref 36.0–46.0)
Hemoglobin: 13.2 g/dL (ref 12.0–15.0)
LYMPHS PCT: 19 % (ref 12–46)
Lymphs Abs: 1.8 10*3/uL (ref 0.7–4.0)
MCH: 30.3 pg (ref 26.0–34.0)
MCHC: 35.1 g/dL (ref 30.0–36.0)
MCV: 86.2 fL (ref 78.0–100.0)
Monocytes Absolute: 0.7 10*3/uL (ref 0.1–1.0)
Monocytes Relative: 7 % (ref 3–12)
NEUTROS ABS: 6.8 10*3/uL (ref 1.7–7.7)
Neutrophils Relative %: 73 % (ref 43–77)
Platelets: 259 10*3/uL (ref 150–400)
RBC: 4.36 MIL/uL (ref 3.87–5.11)
RDW: 12.9 % (ref 11.5–15.5)
WBC: 9.5 10*3/uL (ref 4.0–10.5)

## 2014-11-18 LAB — COMPREHENSIVE METABOLIC PANEL
ALT: 27 U/L (ref 14–54)
AST: 18 U/L (ref 15–41)
Albumin: 4.2 g/dL (ref 3.5–5.0)
Alkaline Phosphatase: 62 U/L (ref 38–126)
Anion gap: 8 (ref 5–15)
BILIRUBIN TOTAL: 0.7 mg/dL (ref 0.3–1.2)
BUN: 17 mg/dL (ref 6–20)
CHLORIDE: 103 mmol/L (ref 101–111)
CO2: 25 mmol/L (ref 22–32)
Calcium: 9.4 mg/dL (ref 8.9–10.3)
Creatinine, Ser: 0.58 mg/dL (ref 0.44–1.00)
GFR calc Af Amer: >60 mL/min (ref >60)
Glucose, Bld: 122 mg/dL — ABNORMAL HIGH (ref 65–99)
Potassium: 3.3 mmol/L — ABNORMAL LOW (ref 3.5–5.1)
Sodium: 136 mmol/L (ref 135–145)
TOTAL PROTEIN: 8.2 g/dL — AB (ref 6.5–8.1)

## 2014-11-18 LAB — I-STAT CG4 LACTIC ACID, ED: Lactic Acid, Venous: 0.57 mmol/L (ref 0.5–2.0)

## 2014-11-18 LAB — URINE MICROSCOPIC-ADD ON

## 2014-11-18 MED ORDER — POTASSIUM CHLORIDE CRYS ER 20 MEQ PO TBCR
40.0000 meq | EXTENDED_RELEASE_TABLET | Freq: Once | ORAL | Status: AC
  Administered 2014-11-18: 40 meq via ORAL
  Filled 2014-11-18: qty 2

## 2014-11-18 MED ORDER — ONDANSETRON 4 MG PO TBDP: 4.0000 mg | ORAL_TABLET | Freq: Three times a day (TID) | ORAL | Status: DC | PRN

## 2014-11-18 MED ORDER — DICYCLOMINE HCL 10 MG PO CAPS
20.0000 mg | ORAL_CAPSULE | Freq: Once | ORAL | Status: AC
Start: 2014-11-18 — End: 2014-11-18
  Administered 2014-11-18: 20 mg via ORAL
  Filled 2014-11-18: qty 2

## 2014-11-18 MED ORDER — IOHEXOL 300 MG/ML  SOLN
50.0000 mL | Freq: Once | INTRAMUSCULAR | Status: AC | PRN
  Administered 2014-11-18: 50 mL via ORAL

## 2014-11-18 MED ORDER — FENTANYL CITRATE (PF) 100 MCG/2ML IJ SOLN
50.0000 ug | Freq: Once | INTRAMUSCULAR | Status: AC
  Administered 2014-11-18: 50 ug via INTRAVENOUS
  Filled 2014-11-18: qty 2

## 2014-11-18 MED ORDER — FUROSEMIDE 10 MG/ML IJ SOLN: 40.0000 mg | Freq: Once | INTRAMUSCULAR | Status: DC

## 2014-11-18 MED ORDER — IOHEXOL 300 MG/ML  SOLN
100.0000 mL | Freq: Once | INTRAMUSCULAR | Status: AC | PRN
  Administered 2014-11-18: 100 mL via INTRAVENOUS

## 2014-11-18 MED ORDER — SODIUM CHLORIDE 0.9 % IV BOLUS (SEPSIS)
1000.0000 mL | Freq: Once | INTRAVENOUS | Status: AC
  Administered 2014-11-18: 1000 mL via INTRAVENOUS

## 2014-11-18 MED ORDER — ONDANSETRON HCL 4 MG/2ML IJ SOLN
4.0000 mg | Freq: Once | INTRAMUSCULAR | Status: AC
  Administered 2014-11-18: 4 mg via INTRAVENOUS
  Filled 2014-11-18: qty 2

## 2014-11-18 MED ORDER — DICYCLOMINE HCL 20 MG PO TABS
20.0000 mg | ORAL_TABLET | Freq: Three times a day (TID) | ORAL | Status: DC | PRN
Start: 2014-11-18 — End: 2015-02-10

## 2014-11-18 NOTE — ED Notes (Signed)
Pt c/o lower abd pain with nausea and denies v/d. Pt states the pain started at 1300

## 2014-11-18 NOTE — Discharge Instructions (Signed)
I recommend you increase your water and fiber intake. If you feel you're unable to eat foods high in fiber then you may use Metamucil which is over-the-counter. I also recommend you use MiraLAX once scoop in a large glass of water once a day and Colace 100 mg tablets twice a day to keep your stool soft. You may hold these medications if you began having diarrhea. If this is not enough to help your bowels move then you may use over-the-counter Fleet enemas and magnesium citrate.   Constipation Constipation is when a person has fewer than three bowel movements a week, has difficulty having a bowel movement, or has stools that are dry, hard, or larger than normal. As people grow older, constipation is more common. If you try to fix constipation with medicines that make you have a bowel movement (laxatives), the problem may get worse. Long-term laxative use may cause the muscles of the colon to become weak. A low-fiber diet, not taking in enough fluids, and taking certain medicines may make constipation worse.  CAUSES   Certain medicines, such as antidepressants, pain medicine, iron supplements, antacids, and water pills.   Certain diseases, such as diabetes, irritable bowel syndrome (IBS), thyroid disease, or depression.   Not drinking enough water.   Not eating enough fiber-rich foods.   Stress or travel.   Lack of physical activity or exercise.   Ignoring the urge to have a bowel movement.   Using laxatives too much.  SIGNS AND SYMPTOMS   Having fewer than three bowel movements a week.   Straining to have a bowel movement.   Having stools that are hard, dry, or larger than normal.   Feeling full or bloated.   Pain in the lower abdomen.   Not feeling relief after having a bowel movement.  DIAGNOSIS  Your health care provider will take a medical history and perform a physical exam. Further testing may be done for severe constipation. Some tests may include:  A barium  enema X-ray to examine your rectum, colon, and, sometimes, your small intestine.   A sigmoidoscopy to examine your lower colon.   A colonoscopy to examine your entire colon. TREATMENT  Treatment will depend on the severity of your constipation and what is causing it. Some dietary treatments include drinking more fluids and eating more fiber-rich foods. Lifestyle treatments may include regular exercise. If these diet and lifestyle recommendations do not help, your health care provider may recommend taking over-the-counter laxative medicines to help you have bowel movements. Prescription medicines may be prescribed if over-the-counter medicines do not work.  HOME CARE INSTRUCTIONS   Eat foods that have a lot of fiber, such as fruits, vegetables, whole grains, and beans.  Limit foods high in fat and processed sugars, such as french fries, hamburgers, cookies, candies, and soda.   A fiber supplement may be added to your diet if you cannot get enough fiber from foods.   Drink enough fluids to keep your urine clear or pale yellow.   Exercise regularly or as directed by your health care provider.   Go to the restroom when you have the urge to go. Do not hold it.   Only take over-the-counter or prescription medicines as directed by your health care provider. Do not take other medicines for constipation without talking to your health care provider first.  SEEK IMMEDIATE MEDICAL CARE IF:   You have bright red blood in your stool.   Your constipation lasts for more than 4 days  or gets worse.   You have abdominal or rectal pain.   You have thin, pencil-like stools.   You have unexplained weight loss. MAKE SURE YOU:   Understand these instructions.  Will watch your condition.  Will get help right away if you are not doing well or get worse. Document Released: 11/26/2003 Document Revised: 03/04/2013 Document Reviewed: 12/09/2012 [facility] Patient Information 2015 [place],  [place]. This information is not intended to replace advice given to you by your health care provider. Make sure you discuss any questions you have with your health care provider.   High-Fiber Diet Fiber is found in fruits, vegetables, and grains. A high-fiber diet encourages the addition of more whole grains, legumes, fruits, and vegetables in your diet. The recommended amount of fiber for adult males is 38 g per day. For adult females, it is 25 g per day. Pregnant and lactating women should get 28 g of fiber per day. If you have a digestive or bowel problem, ask your caregiver for advice before adding high-fiber foods to your diet. Eat a variety of high-fiber foods instead of only a select few type of foods.  PURPOSE  To increase stool bulk.  To make bowel movements more regular to prevent constipation.  To lower cholesterol.  To prevent overeating. WHEN IS THIS DIET USED?  It may be used if you have constipation and hemorrhoids.  It may be used if you have uncomplicated diverticulosis (intestine condition) and irritable bowel syndrome.  It may be used if you need help with weight management.  It may be used if you want to add it to your diet as a protective measure against atherosclerosis, diabetes, and cancer. SOURCES OF FIBER  Whole-grain breads and cereals.  Fruits, such as apples, oranges, bananas, berries, prunes, and pears.  Vegetables, such as green peas, carrots, sweet potatoes, beets, broccoli, cabbage, spinach, and artichokes.  Legumes, such split peas, soy, lentils.  Almonds. FIBER CONTENT IN FOODS Starches and Grains / Dietary Fiber (g)  Cheerios, 1 cup / 3 g  Corn Flakes cereal, 1 cup / 0.7 g  Rice crispy treat cereal, 1 cup / 0.3 g  Instant oatmeal (cooked),  cup / 2 g  Frosted wheat cereal, 1 cup / 5.1 g  Brown, long-grain rice (cooked), 1 cup / 3.5 g  White, long-grain rice (cooked), 1 cup / 0.6 g  Enriched macaroni (cooked), 1 cup / 2.5 g Legumes  / Dietary Fiber (g)  Baked beans (canned, plain, or vegetarian),  cup / 5.2 g  Kidney beans (canned),  cup / 6.8 g  Pinto beans (cooked),  cup / 5.5 g Breads and Crackers / Dietary Fiber (g)  Plain or honey graham crackers, 2 squares / 0.7 g  Saltine crackers, 3 squares / 0.3 g  Plain, salted pretzels, 10 pieces / 1.8 g  Whole-wheat bread, 1 slice / 1.9 g  White bread, 1 slice / 0.7 g  Raisin bread, 1 slice / 1.2 g  Plain bagel, 3 oz / 2 g  Flour tortilla, 1 oz / 0.9 g  Corn tortilla, 1 small / 1.5 g  Hamburger or hotdog bun, 1 small / 0.9 g Fruits / Dietary Fiber (g)  Apple with skin, 1 medium / 4.4 g  Sweetened applesauce,  cup / 1.5 g  Banana,  medium / 1.5 g  Grapes, 10 grapes / 0.4 g  Orange, 1 small / 2.3 g  Raisin, 1.5 oz / 1.6 g  Melon, 1 cup / 1.4  g Vegetables / Dietary Fiber (g)  Green beans (canned),  cup / 1.3 g  Carrots (cooked),  cup / 2.3 g  Broccoli (cooked),  cup / 2.8 g  Peas (cooked),  cup / 4.4 g  Mashed potatoes,  cup / 1.6 g  Lettuce, 1 cup / 0.5 g  Corn (canned),  cup / 1.6 g  Tomato,  cup / 1.1 g Document Released: 02/27/2005 Document Revised: 08/29/2011 Document Reviewed: 06/01/2011 [employer] Patient Information 2015 [place], [place]. This information is not intended to replace advice given to you by your health care provider. Make sure you discuss any questions you have with your health care provider.

## 2014-11-18 NOTE — ED Provider Notes (Signed)
TIME SEEN: 4:40 AM  CHIEF COMPLAINT: Abdominal pain, nausea  HPI: Pt is a 54 y.o. female with history of hypertension, hyperlipidemia, diverticulitis and colitis who presents to the emergency department with complaints of diffuse lower sharp abdominal pain that she described as severe in nature that started yesterday afternoon. Has had nausea. No vomiting or diarrhea. Reports she has had issues with constipation. No bloody stool or melena. She is passing gas. No history of bowel section. States this feels similar to her colitis/diverticulitis but is worse. She has had chills but no document of fevers. She has had C-sections but no other abdominal surgery. She is followed by Dr. Raj Janus with gastroenterology. Her primary care physician is Dr. Katherene Ponto. She denies vaginal bleeding or discharge. No dysuria or hematuria. Last menstrual. In 2013.  ROS: See HPI Constitutional: no fever  Eyes: no drainage  ENT: no runny nose   Cardiovascular:  no chest pain  Resp: no SOB  GI: no vomiting GU: no dysuria Integumentary: no rash  Allergy: no hives  Musculoskeletal: no leg swelling  Neurological: no slurred speech ROS otherwise negative  PAST MEDICAL HISTORY/PAST SURGICAL HISTORY:  Past Medical History  Diagnosis Date  . Diverticulitis   . Arthritis   . Asthma   . Mixed hyperlipidemia   . Essential hypertension, benign   . Tendinitis     Patient reports chronic problem involving her arms and hands  . Chronic back pain     Patient reports lower back disc problems  . Myocardial infarction     Patient reports related to "steroids" in 1985 ([place])  . High blood pressure   . High cholesterol   . Stroke   . Degenerative lumbar disc   . Bursitis   . PMB (postmenopausal bleeding) 03/18/2014  . Enlarged uterus 03/18/2014  . Mixed stress and urge urinary incontinence 03/18/2014  . GERD (gastroesophageal reflux disease)     MEDICATIONS:  Prior to Admission medications   Medication Sig Start  Date End Date Taking? Authorizing Provider  albuterol (PROVENTIL HFA;VENTOLIN HFA) 108 (90 BASE) MCG/ACT inhaler Inhale 2 puffs into the lungs every 6 (six) hours as needed for wheezing.   Yes Historical Provider, MD  aspirin EC 81 MG tablet Take 81 mg by mouth daily.   Yes Historical Provider, MD  DimenhyDRINATE (EQ MOTION SICKNESS PO) Apply 1 application topically as needed (for nausea/vomiting).   Yes Historical Provider, MD  hydrochlorothiazide (HYDRODIURIL) 25 MG tablet Take 25 mg by mouth every morning.    Yes Historical Provider, MD  lactulose (CHRONULAC) 10 GM/15ML solution Take 10 g by mouth 2 (two) times daily as needed for mild constipation.   Yes Historical Provider, MD  Linaclotide Karlene Einstein) 145 MCG CAPS capsule Take 145 mcg by mouth daily as needed (for constipation).    Yes Historical Provider, MD  ondansetron (ZOFRAN) 4 MG tablet Take 1 tablet (4 mg total) by mouth every 8 (eight) hours as needed for nausea or vomiting. 10/05/14   Eber Hong, MD  potassium chloride (K-DUR) 10 MEQ tablet Take 1 tablet (10 mEq total) by mouth daily. 10/05/14   Eber Hong, MD  promethazine (PHENERGAN) 25 MG tablet Take 1 tablet (25 mg total) by mouth every 6 (six) hours as needed for nausea or vomiting. 10/05/14   Eber Hong, MD  simvastatin (ZOCOR) 20 MG tablet Take 1 tablet (20 mg total) by mouth at bedtime. 11/24/13   Jonelle Sidle, MD    ALLERGIES:  Allergies  Allergen Reactions  .  Metoprolol Nausea And Vomiting    Dizziness Elevated BP  . Morphine And Related Hives  . Naproxen Other (See Comments)    Lightheadedness  . Tramadol Nausea And Vomiting  . Ibuprofen Rash  . Penicillins Rash    SOCIAL HISTORY:  Social History  Substance Use Topics  . Smoking status: Former Smoker -- 2.00 packs/day for 12 years    Types: Cigarettes    Quit date: 03/13/1985  . Smokeless tobacco: Never Used  . Alcohol Use: No    FAMILY HISTORY: Family History  Problem Relation Age of Onset  .  Adopted: Yes  . Other Mother     aneursym  . Other Maternal Uncle     stenosis  . Arthritis Maternal Uncle   . Angina Son   . ADD / ADHD Son   . Other Son     food allergies  . Blindness Daughter     from birth; brain damage  . Drug abuse Sister   . Heart attack Paternal Aunt   . Alcohol abuse Cousin   . Drug abuse Cousin     EXAM: BP 184/99 mmHg  Pulse 77  Temp(Src) 97.8 F (36.6 C)  Resp 18  Ht 5' (1.524 m)  Wt 180 lb (81.647 kg)  BMI 35.15 kg/m2  SpO2 96%  LMP 12/26/2011 CONSTITUTIONAL: Alert and oriented and responds appropriately to questions. Appears uncomfortable, tearful, afebrile and nontoxic HEAD: Normocephalic EYES: Conjunctivae clear, PERRL ENT: normal nose; no rhinorrhea; moist mucous membranes; pharynx without lesions noted NECK: Supple, no meningismus, no LAD  CARD: RRR; S1 and S2 appreciated; no murmurs, no clicks, no rubs, no gallops RESP: Normal chest excursion without splinting or tachypnea; breath sounds clear and equal bilaterally; no wheezes, no rhonchi, no rales, no hypoxia or respiratory distress, speaking full sentences ABD/GI: Normal bowel sounds; non-distended; soft, tender to palpation throughout the left lower abdomen with voluntary guarding, no rebound or peritoneal signs, obese BACK:  The back appears normal and is non-tender to palpation, there is no CVA tenderness EXT: Normal ROM in all joints; non-tender to palpation; no edema; normal capillary refill; no cyanosis, no calf tenderness or swelling    SKIN: Normal color for age and race; warm NEURO: Moves all extremities equally, sensation to light touch intact diffusely, cranial nerves II through XII intact PSYCH: The patient's mood and manner are appropriate. Grooming and personal hygiene are appropriate.  MEDICAL DECISION MAKING: Patient here with lower abdominal pain. This may be her diverticulitis, colitis. Differential diagnosis also includes urinary tract infection. Given significant  tenderness on exam we'll repeat a CT scan to rule out microperforation, abscess. Labs, urine pending. We'll give IV fluids, pain and nausea medicine.  ED PROGRESS: Patient's labs are unremarkable. No leukocytosis. Lactate normal. Potassium slightly low at 3.3. This has been replaced in the emergency department. Urine shows small hemoglobin but no other sign of infection. CT scan shows diverticulosis without diverticulitis. No abscess or perforation. Appendix is not visualized but she is not tender in the right lower quadrant at McBurney's point. Suspect that her pain is secondary to constipation. No fecal impaction seen on CT scan. Discussed with her increasing water and fiber intake. Discussed adding MiraLAX once a day and Colace 100 mg twice a day. We'll give Zofran and Bentyl for pain and nausea. Have advised her to avoid narcotics as these may make her constipation worse. Discussed return precautions. She verbalized understanding and is comfortable with this plan.     Layla Maw Peng Thorstenson,  DO 11/18/14 1610

## 2014-11-23 ENCOUNTER — Encounter: Payer: Self-pay | Admitting: Gastroenterology

## 2014-12-08 ENCOUNTER — Encounter: Payer: Self-pay | Admitting: Family Medicine

## 2014-12-08 ENCOUNTER — Ambulatory Visit (INDEPENDENT_AMBULATORY_CARE_PROVIDER_SITE_OTHER): Payer: Self-pay | Admitting: Family Medicine

## 2014-12-08 VITALS — BP 154/86 | HR 63 | Temp 97.2°F | Ht 61.54 in | Wt 185.2 lb

## 2014-12-08 DIAGNOSIS — J4520 Mild intermittent asthma, uncomplicated: Secondary | ICD-10-CM

## 2014-12-08 DIAGNOSIS — E782 Mixed hyperlipidemia: Secondary | ICD-10-CM

## 2014-12-08 DIAGNOSIS — I10 Essential (primary) hypertension: Secondary | ICD-10-CM

## 2014-12-08 MED ORDER — HYDROCHLOROTHIAZIDE 25 MG PO TABS: 25.0000 mg | ORAL_TABLET | Freq: Every day | ORAL | Status: DC

## 2014-12-08 NOTE — Patient Instructions (Signed)
Great to meet you!  Come back in 4-6 weeks, bring a few blood pressure recordings, shoot for 2-3 per week.   Take hydrochlorothiazide  Make a Lab appointment for fasting labs.

## 2014-12-08 NOTE — Progress Notes (Signed)
   HPI  Patient presents today  To establish care  She state that she has fibroid uterus and recurrent diverticulitis. She is feeling well today except for back pain She explains her "back went out" today after straining to lift something. She describes right-sided low back pain with some radiation down her right leg. She explains that she has long-standing degenerative disc disease. She's having some improvement with tumeric. Marland KitchenShe declines additional meds at this time.   She is not fasting  She denies dyspnea, chest pain, palpitations, leg edema.  She is feeling well overall.   PMH: Smoking status noted Her past medical, surgical, social, and family history reviewed and updated in EMR. ROS: Per HPI  Objective: BP 154/86 mmHg  Pulse 63  Temp(Src) 97.2 F (36.2 C) (Oral)  Ht 5' 1.54" (1.563 m)  Wt 185 lb 3.2 oz (84.006 kg)  BMI 34.39 kg/m2  LMP 12/26/2011 Gen: NAD, alert, cooperative with exam HEENT: NCAT, nares clear, TMs normal bilaterally, oropharynx clear Neck: Trachea midline, no thyromegaly CV: RRR, good S1/S2, no murmur Resp: CTABL, no wheezes, non-labored Abd: SNTND, BS present, no guarding or organomegaly- likely diastases recti which was noticed when she was sitting up through her shirt Ext: 1+ pitting edema bilaterally Neuro: Alert and oriented, No gross deficits Skin: Hypopigmented scar on the right lower extremity over her lateral ankle measuring 1.8 cm x 2.5 cm   Assessment and plan:  # Hypertension Start hydrochlorothiazide, she's prescribed this previously but is not taking it currently Keep blood pressure log Follow-up 4-6 weeks.  # Hypokalemia Labs, mild  # Hyperlipidemia Labs, not currently taking statin No lipid studies in EMR   Orders Placed This Encounter  Procedures  . CBC    Standing Status: Future     Number of Occurrences:      Standing Expiration Date: 12/08/2014  . CMP14+EGFR    Standing Status: Future     Number of Occurrences:        Standing Expiration Date: 12/08/2014  . TSH    Standing Status: Future     Number of Occurrences:      Standing Expiration Date: 12/08/2014  . Lipid Panel    Standing Status: Future     Number of Occurrences:      Standing Expiration Date: 12/08/2014    Meds ordered this encounter  Medications  . hydrochlorothiazide (HYDRODIURIL) 25 MG tablet    Sig: Take 1 tablet (25 mg total) by mouth daily.    Dispense:  30 tablet    Refill:  [place], MD [facility] 12/08/2014, 2:19 PM

## 2014-12-15 ENCOUNTER — Ambulatory Visit (INDEPENDENT_AMBULATORY_CARE_PROVIDER_SITE_OTHER): Payer: Self-pay

## 2014-12-15 ENCOUNTER — Other Ambulatory Visit: Payer: Self-pay

## 2014-12-15 DIAGNOSIS — Z23 Encounter for immunization: Secondary | ICD-10-CM

## 2014-12-15 NOTE — Addendum Note (Signed)
Addended by: Prescott Gum on: 12/15/2014 10:17 AM   Modules accepted: Orders

## 2014-12-16 LAB — CMP14+EGFR
A/G RATIO: 1.6 (ref 1.1–2.5)
ALT: 45 IU/L — AB (ref 0–32)
AST: 24 IU/L (ref 0–40)
Albumin: 4.8 g/dL (ref 3.5–5.5)
Alkaline Phosphatase: 65 IU/L (ref 39–117)
BUN/Creatinine Ratio: 15 (ref 9–23)
BUN: 10 mg/dL (ref 6–24)
Bilirubin Total: 0.6 mg/dL (ref 0.0–1.2)
CALCIUM: 10.1 mg/dL (ref 8.7–10.2)
CHLORIDE: 90 mmol/L — AB (ref 97–108)
CO2: 27 mmol/L (ref 18–29)
Creatinine, Ser: 0.65 mg/dL (ref 0.57–1.00)
GFR, EST AFRICAN AMERICAN: 116 mL/min/{1.73_m2} (ref >59)
GFR, EST NON AFRICAN AMERICAN: 100 mL/min/{1.73_m2} (ref >59)
GLUCOSE: 113 mg/dL — AB (ref 65–99)
Globulin, Total: 3 g/dL (ref 1.5–4.5)
POTASSIUM: 3.4 mmol/L — AB (ref 3.5–5.2)
Sodium: 134 mmol/L (ref 134–144)
TOTAL PROTEIN: 7.8 g/dL (ref 6.0–8.5)

## 2014-12-16 LAB — CBC WITH DIFFERENTIAL/PLATELET
BASOS: 0 %
Basophils Absolute: 0 10*3/uL (ref 0.0–0.2)
EOS (ABSOLUTE): 0.1 10*3/uL (ref 0.0–0.4)
EOS: 2 %
Hematocrit: 40.6 % (ref 34.0–46.6)
Hemoglobin: 13.8 g/dL (ref 11.1–15.9)
IMMATURE GRANS (ABS): 0 10*3/uL (ref 0.0–0.1)
IMMATURE GRANULOCYTES: 0 %
LYMPHS: 34 %
Lymphocytes Absolute: 1.6 10*3/uL (ref 0.7–3.1)
MCH: 30.1 pg (ref 26.6–33.0)
MCHC: 34 g/dL (ref 31.5–35.7)
MCV: 89 fL (ref 79–97)
MONOS ABS: 0.4 10*3/uL (ref 0.1–0.9)
Monocytes: 9 %
NEUTROS PCT: 55 %
Neutrophils Absolute: 2.6 10*3/uL (ref 1.4–7.0)
PLATELETS: 296 10*3/uL (ref 150–379)
RBC: 4.58 x10E6/uL (ref 3.77–5.28)
RDW: 12.9 % (ref 12.3–15.4)
WBC: 4.7 10*3/uL (ref 3.4–10.8)

## 2014-12-16 LAB — LIPID PANEL
Chol/HDL Ratio: 3.9 ratio units (ref 0.0–4.4)
Cholesterol, Total: 305 mg/dL — ABNORMAL HIGH (ref 100–199)
HDL: 79 mg/dL (ref >39)
LDL Calculated: 207 mg/dL — ABNORMAL HIGH (ref 0–99)
TRIGLYCERIDES: 93 mg/dL (ref 0–149)
VLDL CHOLESTEROL CAL: 19 mg/dL (ref 5–40)

## 2014-12-16 LAB — TSH: TSH: 2.3 u[IU]/mL (ref 0.450–4.500)

## 2014-12-17 ENCOUNTER — Other Ambulatory Visit: Payer: Self-pay | Admitting: *Deleted

## 2014-12-17 MED ORDER — ATORVASTATIN CALCIUM 20 MG PO TABS: 20.0000 mg | ORAL_TABLET | Freq: Every day | ORAL | Status: DC

## 2014-12-18 LAB — SPECIMEN STATUS REPORT

## 2014-12-18 LAB — HGB A1C W/O EAG: Hgb A1c MFr Bld: 5.7 % — ABNORMAL HIGH (ref 4.8–5.6)

## 2014-12-24 ENCOUNTER — Encounter: Payer: Self-pay | Admitting: Gastroenterology

## 2014-12-24 ENCOUNTER — Ambulatory Visit (INDEPENDENT_AMBULATORY_CARE_PROVIDER_SITE_OTHER): Payer: Self-pay | Admitting: Gastroenterology

## 2014-12-24 VITALS — BP 127/78 | HR 67 | Temp 97.8°F | Ht 60.0 in | Wt 183.2 lb

## 2014-12-24 DIAGNOSIS — K5732 Diverticulitis of large intestine without perforation or abscess without bleeding: Secondary | ICD-10-CM

## 2014-12-24 NOTE — Progress Notes (Signed)
Subjective:    Patient ID: Lisa [name], female    DOB: 12-Feb-1961, 54 y.o.   MRN: [id number] Kevin FentonSamuel Bradshaw, MD  HPI HAS PROBLEMS WITH HEMORRHOIDS. HAS HAD TWO EPISODES OF DIVERTICULITIS IN 2016. CAN'T AFFORD INSURANCE EVEN ON THE EXCHANGE. PT STATES HE WILL NEED TO PRAY AND BELIEVE GOD FOR HEALING. SUBJECTIVE FEVER/CHILLS IN SEP 2016. BMs: AT LEAST 1-2 TIMES/DAY. USING LINZESS PRN. USING OINTMENTS AND SUPP FOR HEMORRHOID PROBLEMS. WATERY STOOLS AFTER LINZESS AND PEACHES/PRUNE JUICE/KIWI/CABBAGE. COCONUT STOPS HER DIARRHEA. HAVING TROUBLE WITH R LOWER ABDOMINAL PAIN BUT NOT TODAY. HUSBANDS FAMILY DOESN'T LIKE HER BECAUSE SHE LOOKS 39 AND THEY ARE JEALOUS.  PT DENIES FEVER, CHILLS, HEMATOCHEZIA, nausea, vomiting, melena, CHEST PAIN, SHORTNESS OF BREATH, CHANGE IN BOWEL IN HABITS, constipation, problems swallowing,OR heartburn or indigestion.  Past Medical History  Diagnosis Date  . Diverticulitis   . Arthritis   . Asthma   . Mixed hyperlipidemia   . Essential hypertension, benign   . Tendinitis     Patient reports chronic problem involving her arms and hands  . Chronic back pain     Patient reports lower back disc problems  . Myocardial infarction [facility](HCC)     Patient reports related to "steroids" in 1985 ([place])  . High blood pressure   . High cholesterol   . Stroke (HCC)   . Degenerative lumbar disc   . Bursitis   . PMB (postmenopausal bleeding) 03/18/2014  . Enlarged uterus 03/18/2014  . Mixed stress and urge urinary incontinence 03/18/2014  . GERD (gastroesophageal reflux disease)   . Allergy   . Anemia   . Seizures [facility](HCC)    Past Surgical History  Procedure Laterality Date  . Cesarean section    . Tonsillectomy    . Adnoids    . Colonoscopy N/A 06/04/2014    SLF: 1. moderate diverticulosis in teh sigmoid colon, descending colon, and transverse colon. 2. Two colon polyps removed 3. Large external and small internal hemorroids.    Allergies  Allergen Reactions  .  Metoprolol Nausea And Vomiting    Dizziness Elevated BP  . Morphine And Related Other (See Comments)    disoriented   . Naproxen Other (See Comments)    Lightheadedness  . Tramadol Nausea And Vomiting  . Ibuprofen Rash  . Penicillins Rash    Current Outpatient Prescriptions  Medication Sig Dispense Refill  . albuterol (PROVENTIL HFA;VENTOLIN HFA) 108 (90 BASE) MCG/ACT inhaler Inhale 2 puffs into the lungs every 6 (six) hours as needed for wheezing.    Marland Kitchen. aspirin EC 81 MG tablet Take 81 mg by mouth daily.    Marland Kitchen. atorvastatin (LIPITOR) 20 MG tablet Take 1 tablet (20 mg total) by mouth daily.    . DimenhyDRINATE (EQ MOTION SICKNESS PO) Apply 1 application topically as needed (for nausea/vomiting).    . hydrochlorothiazide (HYDRODIURIL) 25 MG tablet Take 1 tablet (25 mg total) by mouth daily.    Marland Kitchen. lactulose (CHRONULAC) 10 GM/15ML solution Take 10 g by mouth 2 (two) times daily as needed for mild constipation.    . Linaclotide (LINZESS) 145 MCG CAPS capsule Take 145 mcg by mouth daily as needed (for constipation).     . NON FORMULARY tumeric    . ondansetron (ZOFRAN ODT) 4 MG disintegrating tablet Take 1 tablet (4 mg total) by mouth every 8 (eight) hours as needed for nausea or vomiting.    .      .      .      .Marland Kitchen  Social History  Substance Use Topics  . Smoking status: Former Smoker -- 2.00 packs/day for 12 years    Types: Cigarettes    Quit date: 03/13/1985  . Smokeless tobacco: Never Used  . Alcohol Use: No   Review of Systems PER HPI OTHERWISE ALL SYSTEMS ARE NEGATIVE.    Objective:   Physical Exam  Constitutional: She is oriented to person, place, and time. She appears well-developed and well-nourished. No distress.  HENT:  Head: Normocephalic and atraumatic.  Mouth/Throat: Oropharynx is clear and moist. No oropharyngeal exudate.  Eyes: Pupils are equal, round, and reactive to light. No scleral icterus.  Neck: Normal range of motion. Neck supple.  Cardiovascular:  Normal rate, regular rhythm and normal heart sounds.   Pulmonary/Chest: Effort normal and breath sounds normal. No respiratory distress.  Abdominal: Soft. Bowel sounds are normal. She exhibits no distension. There is tenderness. There is no rebound and no guarding.  MILD LUQ TTP  Musculoskeletal: She exhibits no edema.  Lymphadenopathy:    She has no cervical adenopathy.  Neurological: She is alert and oriented to person, place, and time.  NO  NEW FOCAL DEFICITS   Psychiatric:  FLAT AFFECT, NL MOOD   Vitals reviewed.         Assessment & Plan:

## 2014-12-24 NOTE — Progress Notes (Signed)
ON RECALL  °

## 2014-12-24 NOTE — Patient Instructions (Signed)
CONTINUE TO MONITOR SYMPTOMS.  RECOMMEND SIGMOID COLECTOMY. IF YOU CONTINUE WITH RECURRENT SYMPTOMS THEN SHE MAY DEVELOP A COMPLICATED CASE WITH ABSCESS/DRAINS/COLOSTOMY.   AVOID FOOD THAT MAY TRIGGER DIVERTICULITIS.  FOLLOW UP IN 6 MOS.

## 2014-12-24 NOTE — Progress Notes (Signed)
CC'ED TO PCP 

## 2014-12-24 NOTE — Assessment & Plan Note (Addendum)
CLINICALLY IMPROVED. TWO EPISODES ON 2016. PT REPORTS FOUR TOTAL.  CONTINUE TO MONITOR SYMPTOMS. RECOMMEND SIGMOID COLECTOMY. IF CONTINUES WITH RECURRENT SYMPTOMS THEN SHE MAY DEVELOP A COMPLICATED CASE-ABSCESS/DRAINS/COLOSTOMY. PT VOICED UNDERSTANDING. AVOID FOOD THAT MAY TRIGGER DIVERTICULITIS FOLLOW UP IN 6 MOS.   GREATER THAN 50% WAS SPENT IN COUNSELING & COORDINATION OF CARE WITH THE PATIENT: DISCUSSED  PROCEDURE, BENEFITS, RISKS, AND MANAGEMENT OF DIVERTICULITIS. TOTAL ENCOUNTER TIME: 15 MINS.

## 2014-12-29 ENCOUNTER — Telehealth: Payer: Self-pay

## 2014-12-29 NOTE — Telephone Encounter (Signed)
Opened in error

## 2014-12-31 ENCOUNTER — Telehealth: Payer: Self-pay

## 2014-12-31 NOTE — Telephone Encounter (Signed)
REFER TO DR. Lovell SheehanJENKINS FOR  DX: RECURRENT SIGMOID DIVERTICULITIS/SIGMOID COLECTOMY.

## 2014-12-31 NOTE — Telephone Encounter (Signed)
Pt is calling because she is has the $250.00 so that she can be referral for surgery. Please advise

## 2014-12-31 NOTE — Telephone Encounter (Signed)
Referral has been made.

## 2015-01-01 NOTE — Telephone Encounter (Signed)
Pt called and states that she will have to wait on the surgery. It is too much of a financial hardship to pay for it right now. States the 250.00 would leave her having nothing for her rent.  Wants to make sure Dr Darrick PennaFields is aware

## 2015-01-01 NOTE — Telephone Encounter (Signed)
Kelly called from [employer] office to let us know that she would have to pay $100.00 the day of the office visit. She would also need to have $2,500.00 before surgery.

## 2015-01-01 NOTE — Telephone Encounter (Signed)
REVIEWED-NO ADDITIONAL RECOMMENDATIONS. 

## 2015-01-07 ENCOUNTER — Ambulatory Visit (INDEPENDENT_AMBULATORY_CARE_PROVIDER_SITE_OTHER): Payer: Self-pay | Admitting: Family Medicine

## 2015-01-07 ENCOUNTER — Encounter: Payer: Self-pay | Admitting: Family Medicine

## 2015-01-07 VITALS — BP 148/85 | HR 60 | Temp 97.0°F | Ht 60.0 in | Wt 183.6 lb

## 2015-01-07 DIAGNOSIS — I259 Chronic ischemic heart disease, unspecified: Secondary | ICD-10-CM

## 2015-01-07 DIAGNOSIS — Z0000 Encounter for general adult medical examination without abnormal findings: Secondary | ICD-10-CM

## 2015-01-07 DIAGNOSIS — I10 Essential (primary) hypertension: Secondary | ICD-10-CM

## 2015-01-07 DIAGNOSIS — R7303 Prediabetes: Secondary | ICD-10-CM

## 2015-01-07 DIAGNOSIS — R079 Chest pain, unspecified: Secondary | ICD-10-CM

## 2015-01-07 NOTE — Progress Notes (Signed)
   HPI  Patient presents today here for follow-up hypertension, hyperlipidemia, prediabetes.  Hypertension Blood pressure improved mildly with now average blood pressure from home log averaging 1:30 to 140s over 80s Tolerating HCTZ easily She does note stable mid chest pain and a history of ischemic heart disease. She states that the chest pain comes on with a certain amount of exertion which is predictable. It stops when she rests area She denies any chest pain at this time.  Hyperlipidemia, prediabetes Watching her diet Understands labs Tolerating statin  Left ankle pain. Notes popping of left ankle about 2 or 3 days ago followed by a little bit of swelling and mild pain. She still walking easily and does not think that this is broken. She states that her ankles pop normally that she just wanted to make sure this safe. She does have increased pain with walking describing the lateral achy ankle pain worse with pressure on it   PMH: Smoking status noted ROS: Per HPI  Objective: BP 148/85 mmHg  Pulse 60  Temp(Src) 97 F (36.1 C) (Oral)  Ht 5' (1.524 m)  Wt 183 lb 9.6 oz (83.28 kg)  BMI 35.86 kg/m2  LMP 12/26/2011 Gen: NAD, alert, cooperative with exam HEENT: NCAT CV: RRR, good S1/S2, no murmur Resp: CTABL, no wheezes, non-labored Ext: No edema, warm Neuro: Alert and oriented, No gross deficits Msk: L ankle with tenderness on lateral ligaments, no joint laxity, no swelling or erythema Walking with L handed cane, L sided lim   Assessment and plan:  # Hypertension Still low at goal Continue HCTZ She is resistant to adding new medications at this time, however she will continue therapeutic lifestyle changes Consider adding lisinopril, plan Prinzide 20/25 at next visit Continue log  # Hyperlipidemia Continue therapeutic lifestyle changes Continue statin, repeat labs in 2 months  # Ischemic heart disease Likely stable angina Continue aspirin and statin Lexiscan 2  years ago shows possible RCA ischemia Refer to cardiology  # Healthcare maintenance Hepatitis C and HIV today Repeat labs, fasting    Orders Placed This Encounter  Procedures  . BMP8+EGFR  . Hepatitis C antibody  . HIV antibody  . Ambulatory referral to Cardiology    Referral Priority:  Routine    Referral Type:  Consultation    Referral Reason:  Specialty Services Required    Requested Specialty:  Cardiology    Number of Visits Requested:  [place], MD [facility] 01/07/2015, 10:49 AM

## 2015-01-07 NOTE — Patient Instructions (Signed)
Great to see you!  Continue to keep track of your blood pressures- good job!  Watch your salt intake, continue your exercise  For your ankle, rest for 1 week, Ice 2-3 times daily, wear supportive shoes   Come back in 2 months

## 2015-01-08 LAB — BMP8+EGFR
BUN/Creatinine Ratio: 19 (ref 9–23)
BUN: 10 mg/dL (ref 6–24)
CALCIUM: 9.7 mg/dL (ref 8.7–10.2)
CO2: 23 mmol/L (ref 18–29)
CREATININE: 0.54 mg/dL — AB (ref 0.57–1.00)
Chloride: 89 mmol/L — ABNORMAL LOW (ref 97–106)
GFR calc Af Amer: 123 mL/min/{1.73_m2} (ref >59)
GFR calc non Af Amer: 107 mL/min/{1.73_m2} (ref >59)
GLUCOSE: 97 mg/dL (ref 65–99)
Potassium: 3.3 mmol/L — ABNORMAL LOW (ref 3.5–5.2)
SODIUM: 130 mmol/L — AB (ref 136–144)

## 2015-01-08 LAB — HEPATITIS C ANTIBODY

## 2015-01-08 LAB — HIV ANTIBODY (ROUTINE TESTING W REFLEX): HIV SCREEN 4TH GENERATION: NONREACTIVE

## 2015-01-11 ENCOUNTER — Other Ambulatory Visit: Payer: Self-pay

## 2015-01-11 DIAGNOSIS — E876 Hypokalemia: Secondary | ICD-10-CM

## 2015-01-11 NOTE — Progress Notes (Signed)
Patient informed, order put in for BMP

## 2015-01-14 MED ORDER — LINACLOTIDE 145 MCG PO CAPS: 145.0000 ug | ORAL_CAPSULE | Freq: Every day | ORAL | Status: DC

## 2015-01-14 MED ORDER — LINACLOTIDE 145 MCG PO CAPS
145.0000 ug | ORAL_CAPSULE | Freq: Every day | ORAL | Status: DC
Start: 2015-01-14 — End: 2015-01-14

## 2015-01-14 NOTE — Addendum Note (Signed)
Addended by: [place] BaliFIELDS, Jawaan Adachi L on: 01/14/2015 04:57 PM   Modules accepted: Orders

## 2015-01-14 NOTE — Telephone Encounter (Addendum)
PT NEED ASSISTANCE TO GET LINZESS. PAPERWORK WILL BE FAXED NOV 4.

## 2015-01-15 NOTE — Telephone Encounter (Signed)
FAX RX NOV 4.

## 2015-01-29 ENCOUNTER — Other Ambulatory Visit (INDEPENDENT_AMBULATORY_CARE_PROVIDER_SITE_OTHER): Payer: Self-pay

## 2015-01-29 DIAGNOSIS — E876 Hypokalemia: Secondary | ICD-10-CM

## 2015-01-29 NOTE — Progress Notes (Signed)
Lab only 

## 2015-01-30 LAB — BMP8+EGFR
BUN / CREAT RATIO: 16 (ref 9–23)
BUN: 10 mg/dL (ref 6–24)
CHLORIDE: 89 mmol/L — AB (ref 97–106)
CO2: 26 mmol/L (ref 18–29)
CREATININE: 0.61 mg/dL (ref 0.57–1.00)
Calcium: 10 mg/dL (ref 8.7–10.2)
GFR calc Af Amer: 118 mL/min/{1.73_m2} (ref >59)
GFR calc non Af Amer: 102 mL/min/{1.73_m2} (ref >59)
GLUCOSE: 97 mg/dL (ref 65–99)
POTASSIUM: 3.8 mmol/L (ref 3.5–5.2)
SODIUM: 132 mmol/L — AB (ref 136–144)

## 2015-02-10 ENCOUNTER — Encounter: Payer: Self-pay | Admitting: Cardiology

## 2015-02-10 ENCOUNTER — Ambulatory Visit (INDEPENDENT_AMBULATORY_CARE_PROVIDER_SITE_OTHER): Payer: Self-pay | Admitting: Cardiology

## 2015-02-10 VITALS — BP 145/90 | HR 64 | Ht 60.0 in | Wt 184.0 lb

## 2015-02-10 DIAGNOSIS — I10 Essential (primary) hypertension: Secondary | ICD-10-CM

## 2015-02-10 NOTE — Patient Instructions (Signed)
Medication Instructions:  The current medical regimen is effective;  continue present plan and medications.  Labwork: [facility].  Follow-Up: As needed.  If you need a refill on your cardiac medications before your next appointment, please call your pharmacy.  Thank you for choosing [facility]!!

## 2015-02-10 NOTE — Progress Notes (Signed)
Cardiology Office Note   Date:  02/10/2015   ID:  Lisa [name], DOB 1960/11/28, MRN [id number]  PCP:  Kevin FentonSamuel Bradshaw, MD  Cardiologist:   Rollene RotundaJames Poppi Scantling, MD   Chief Complaint  Patient presents with  . Chest Pain      History of Present Illness: Lisa [name] is a 54 y.o. female who presents for evaluation of chest pain.  She saw Dr. Diona BrownerMcDowell earlier this year. She described a vague history of steroid-induced heart attack when she lived in [place] many years ago  . However, Dr. Diona BrownerMcDowell did not think she had any ongoing symptoms and so did not suggest further cardiovascular testing at his initial evaluation. She did have a stress test that I reviewed from 2014 and an echocardiogram. These were essentially unremarkable low risk.  She apparently has been having some chest discomfort. She said she lives in [place] [address] housing and every 6 months when they are due for housing inspection he gets stressful. She'll get some chest discomfort that is right of sternum. She can play solitaire and take a baby aspirin and her symptoms will go away. Recently since the inspection is (stress is gone she's no longer having any chest discomfort. She can do activities such as walking without bringing on any symptoms. She's not describing any new breath, PND or orthopnea. She's not having any new palpitations, presyncope or syncope. She's had no weight gain or edema.   Past Medical History  Diagnosis Date  . Diverticulitis   . Arthritis   . Asthma   . Mixed hyperlipidemia   . Essential hypertension, benign   . Tendinitis     Patient reports chronic problem involving her arms and hands  . Chronic back pain     Patient reports lower back disc problems  . Myocardial infarction [facility](HCC)     Patient reports related to "steroids" in 1985 ([place])  . High blood pressure   . High cholesterol   . Stroke (HCC)   . Degenerative lumbar disc   . Bursitis   . PMB (postmenopausal bleeding) 03/18/2014  . Enlarged  uterus 03/18/2014  . Mixed stress and urge urinary incontinence 03/18/2014  . GERD (gastroesophageal reflux disease)   . Allergy   . Anemia   . Seizures [facility](HCC)     Past Surgical History  Procedure Laterality Date  . Cesarean section    . Tonsillectomy    . Adnoids    . Colonoscopy N/A 06/04/2014    SLF: 1. moderate diverticulosis in teh sigmoid colon, descending colon, and transverse colon. 2. Two colon polyps removed 3. Large external and small internal hemorroids.      Current Outpatient Prescriptions  Medication Sig Dispense Refill  . albuterol (PROVENTIL HFA;VENTOLIN HFA) 108 (90 BASE) MCG/ACT inhaler Inhale 2 puffs into the lungs every 6 (six) hours as needed for wheezing.    Marland Kitchen. aspirin EC 81 MG tablet Take 81 mg by mouth daily.    Marland Kitchen. atorvastatin (LIPITOR) 20 MG tablet Take 1 tablet (20 mg total) by mouth daily. 90 tablet 3  . DimenhyDRINATE (EQ MOTION SICKNESS PO) Apply 1 application topically as needed (for nausea/vomiting).    . hydrochlorothiazide (HYDRODIURIL) 25 MG tablet Take 1 tablet (25 mg total) by mouth daily. 30 tablet 5  . lactulose (CHRONULAC) 10 GM/15ML solution Take 10 g by mouth 2 (two) times daily as needed for mild constipation.    . Linaclotide (LINZESS) 145 MCG CAPS capsule Take 1 capsule (145 mcg total)  by mouth daily. (Patient taking differently: Take 145 mcg by mouth as needed (For IBS). ) 90 capsule 3  . NON FORMULARY tumeric    . potassium chloride (K-DUR) 10 MEQ tablet Take 1 tablet (10 mEq total) by mouth daily. (Patient not taking: Reported on 12/24/2014) 10 tablet 0   No current facility-administered medications for this visit.    Allergies:   Metoprolol; Morphine and related; Naproxen; Tramadol; Ibuprofen; and Penicillins    ROS:  Please see the history of present illness.   Otherwise, review of systems are positive for none.   All other systems are reviewed and negative.    PHYSICAL EXAM: VS:  BP 145/90 mmHg  Pulse 64  Ht 5' (1.524 m)  Wt 184  lb (83.462 kg)  BMI 35.94 kg/m2  LMP 12/26/2011 , BMI Body mass index is 35.94 kg/(m^2). GENERAL:  Well appearing HEENT:  Pupils equal round and reactive, fundi not visualized, oral mucosa unremarkable NECK:  No jugular venous distention, waveform within normal limits, carotid upstroke brisk and symmetric, no bruits, no thyromegaly LYMPHATICS:  No cervical, inguinal adenopathy LUNGS:  Clear to auscultation bilaterally BACK:  No CVA tenderness CHEST:  Unremarkable HEART:  PMI not displaced or sustained,S1 and S2 within normal limits, no S3, no S4, no clicks, no rubs, no murmurs ABD:  Flat, positive bowel sounds normal in frequency in pitch, no bruits, no rebound, no guarding, no midline pulsatile mass, no hepatomegaly, no splenomegaly EXT:  2 plus pulses throughout, no edema, no cyanosis no clubbing SKIN:  No rashes no nodules NEURO:  Cranial nerves II through XII grossly intact, motor grossly intact throughout PSYCH:  Cognitively intact, oriented to person place and time    EKG:  EKG is ordered today. The ekg ordered today demonstrates sinus rhythm, rate 63, axis within normal limits, intervals within normal limits, no acute ST-T wave changes.   Recent Labs: 08/03/2014: Magnesium 1.7 11/18/2014: Hemoglobin 13.2; Platelets 259 12/15/2014: ALT 45*; TSH 2.300 01/29/2015: BUN 10; Creatinine, Ser 0.61; Potassium 3.8; Sodium 132*    Lipid Panel    Component Value Date/Time   CHOL 305* 12/15/2014 1017   TRIG 93 12/15/2014 1017   HDL 79 12/15/2014 1017   CHOLHDL 3.9 12/15/2014 1017   LDLCALC 207* 12/15/2014 1017      Wt Readings from Last 3 Encounters:  02/10/15 184 lb (83.462 kg)  01/07/15 183 lb 9.6 oz (83.28 kg)  12/24/14 183 lb 3.2 oz (83.099 kg)      Other studies Reviewed: Additional studies/ records that were reviewed today include: Previous echo and [employer] [employer].. Review of the above records demonstrates:  Please see elsewhere in the note.     ASSESSMENT AND  PLAN:  CHEST PAIN:  Her pain is atypical. There is no objective evidence of ischemia. She's no longer having symptoms. No further cardiovascular testing is suggested.  DYSLIPIDEMIA:  Her cholesterol was markedly abnormal as above. I discussed this with her. She was changed from Zocor to Lipitor after this. We discussed an appropriate diet. I have put orders in for a fasting lipid profile liver enzymes in one month.   Current medicines are reviewed at length with the patient today.  The patient does not have concerns regarding medicines.  The following changes have been made:  no change  Labs/ tests ordered today include:   Orders Placed This Encounter  Procedures  . Lipid panel  . Hepatic function panel  . EKG 12-Lead     Disposition:  FU with me as needed.    Signed, Rollene Rotunda, MD  02/10/2015 2:05 PM    [facility]

## 2015-02-24 ENCOUNTER — Telehealth: Payer: Self-pay | Admitting: Gastroenterology

## 2015-02-24 NOTE — Telephone Encounter (Signed)
I received a release of medical records today from [facility] on [facility]. Patient wanted all of her records forwarded to them. I called their office and they are on epic and do not need for me to fax her records.

## 2015-03-17 ENCOUNTER — Ambulatory Visit (INDEPENDENT_AMBULATORY_CARE_PROVIDER_SITE_OTHER): Payer: Self-pay | Admitting: Family Medicine

## 2015-03-17 ENCOUNTER — Encounter: Payer: Self-pay | Admitting: Family Medicine

## 2015-03-17 ENCOUNTER — Encounter (INDEPENDENT_AMBULATORY_CARE_PROVIDER_SITE_OTHER): Payer: Self-pay

## 2015-03-17 VITALS — BP 154/89 | HR 59 | Temp 97.0°F | Ht 61.52 in | Wt 181.8 lb

## 2015-03-17 DIAGNOSIS — E782 Mixed hyperlipidemia: Secondary | ICD-10-CM

## 2015-03-17 NOTE — Patient Instructions (Signed)
Great to see you!  We will call with your results within  1 week  Let's see you back in 4-6 weeks., bring a blood pressure log.

## 2015-03-17 NOTE — Progress Notes (Signed)
   HPI  Patient presents today here for routine follow-up.  Patient states that she's feeling well in general, she has intermittent mild chest pressure scribed is left-sided chest pressure worse with stress, drinking apple cider vinegar, or occasionally with activity.  She's recently seen cardiology, Dr. Antoine PocheHochrein, who has reviewed her previous stress test. Her chest pain does not sound cardiac in nature.  She describes some complicated family issues that are going on that are causing more stress lately. She states that the pain seemed to be mostly associated with stress.  Her blood pressure at home is usually 130s systolic. Good medication compliance No problems with atorvastatin Had fasting labs drawn this morning  PMH: Smoking status noted ROS: Per HPI  Objective: BP 154/89 mmHg  Pulse 59  Temp(Src) 97 F (36.1 C) (Oral)  Ht 5' 1.52" (1.563 m)  Wt 181 lb 12.8 oz (82.464 kg)  BMI 33.76 kg/m2  LMP 12/26/2011 Gen: NAD, alert, cooperative with exam, difficult historian- tangential HEENT: NCAT, EOMI, PERRL CV: RRR, good S1/S2, no murmur Resp: CTABL, no wheezes, non-labored Ext: No edema, warm Neuro: Alert and oriented, No gross deficits  Assessment and plan:  # Atypical chest pain Likely anxiety and GI related, patient not really bothered by it.  Continue to follow up as needed  # HTN Elevated today but good at home Log, RTC in 4-6 weeks for repeat check.  resistant tpo new meds    Meds ordered this encounter  Medications  . BEE POLLEN PO    Sig: Take by mouth.    Murtis SinkSam Bradshaw, MD [facility] 03/17/2015, 1:30 PM

## 2015-03-18 LAB — LIPID PANEL
CHOLESTEROL TOTAL: 179 mg/dL (ref 100–199)
Chol/HDL Ratio: 2.6 ratio units (ref 0.0–4.4)
HDL: 68 mg/dL (ref >39)
LDL CALC: 99 mg/dL (ref 0–99)
TRIGLYCERIDES: 60 mg/dL (ref 0–149)
VLDL CHOLESTEROL CAL: 12 mg/dL (ref 5–40)

## 2015-03-18 LAB — HEPATIC FUNCTION PANEL
ALK PHOS: 69 IU/L (ref 39–117)
ALT: 35 IU/L — ABNORMAL HIGH (ref 0–32)
AST: 22 IU/L (ref 0–40)
Albumin: 4.4 g/dL (ref 3.5–5.5)
Bilirubin Total: 0.2 mg/dL (ref 0.0–1.2)
Bilirubin, Direct: 0.09 mg/dL (ref 0.00–0.40)
TOTAL PROTEIN: 7.4 g/dL (ref 6.0–8.5)

## 2015-04-08 ENCOUNTER — Ambulatory Visit (INDEPENDENT_AMBULATORY_CARE_PROVIDER_SITE_OTHER): Payer: Self-pay | Admitting: Family Medicine

## 2015-04-08 ENCOUNTER — Encounter: Payer: Self-pay | Admitting: Family Medicine

## 2015-04-08 VITALS — BP 138/86 | HR 55 | Temp 97.0°F | Ht 61.52 in | Wt 179.8 lb

## 2015-04-08 DIAGNOSIS — E876 Hypokalemia: Secondary | ICD-10-CM

## 2015-04-08 DIAGNOSIS — G8929 Other chronic pain: Secondary | ICD-10-CM

## 2015-04-08 DIAGNOSIS — M545 Low back pain: Secondary | ICD-10-CM

## 2015-04-08 DIAGNOSIS — I10 Essential (primary) hypertension: Secondary | ICD-10-CM

## 2015-04-08 NOTE — Patient Instructions (Signed)
Great to see you!  Lets see you back in 4 months  We will call with results in 1 week or less

## 2015-04-08 NOTE — Progress Notes (Signed)
   HPI  Patient presents today today for follow-up of hypertension.  Patient explains that she feels her hypertension is stress related. She's been identifying stressors in her life and trying to reduce them. She's been using music for stress coping.  She has good compliance Last visit her average blood pressure at home has been around 140/80 She's had arrange up to 162/105 down to 117/82, she feels that it fluctuates largely with stress. She has stable shortness of breath helped by albuterol. She's only using albuterol maybe once every 2 weeks.  She uses ginger root for GERD  Back pain Describes chronic intermittent low back pain. She's previously seen by orthopedic surgery in [place] for this Report MRI which shows degenerative disc disease especially around L3/L4.   PMH: Smoking status noted ROS: Per HPI  Objective: BP 138/86 mmHg  Pulse 55  Temp(Src) 97 F (36.1 C) (Oral)  Ht 5' 1.52" (1.563 m)  Wt 179 lb 12.8 oz (81.557 kg)  BMI 33.38 kg/m2  LMP 12/26/2011 Gen: NAD, alert, cooperative with exam HEENT: NCAT CV: RRR, good S1/S2, no murmur Resp: CTABL, no wheezes, non-labored Ext: No edema, warm Neuro: Alert and oriented, No gross deficits , normal gait  Assessment and plan:  # Hypertension Reasonable control, at goal today Continue HCTZ Repeat BMP considering previous hyperkalemia  # Hyperkalemia Mild, checking again to make sure HCTZ does not exacerbate.   # Degenerative disc disease, chronic back pain Refer to physical therapy No Red flags    Orders Placed This Encounter  Procedures  . [place], MD [facility] 04/08/2015, 11:08 AM

## 2015-04-09 LAB — BMP8+EGFR
BUN / CREAT RATIO: 17 (ref 9–23)
BUN: 12 mg/dL (ref 6–24)
CO2: 28 mmol/L (ref 18–29)
CREATININE: 0.69 mg/dL (ref 0.57–1.00)
Calcium: 10.1 mg/dL (ref 8.7–10.2)
Chloride: 94 mmol/L — ABNORMAL LOW (ref 96–106)
GFR calc Af Amer: 113 mL/min/{1.73_m2} (ref >59)
GFR calc non Af Amer: 98 mL/min/{1.73_m2} (ref >59)
GLUCOSE: 105 mg/dL — AB (ref 65–99)
Potassium: 3.8 mmol/L (ref 3.5–5.2)
SODIUM: 137 mmol/L (ref 134–144)

## 2015-04-09 NOTE — Progress Notes (Signed)
Patient aware.

## 2015-04-23 ENCOUNTER — Ambulatory Visit: Payer: Self-pay | Attending: Family Medicine | Admitting: Physical Therapy

## 2015-04-23 DIAGNOSIS — M545 Low back pain, unspecified: Secondary | ICD-10-CM

## 2015-04-23 NOTE — Therapy (Signed)
[facility] [address] [place], [place], [address] Phone: [phone]   Fax:  [phone]  Physical Therapy Evaluation  Patient Details  Name: Lisa [name] MRN: [id number] Date of Birth: [birth year] Referring Provider: Kevin Fenton MD.  Encounter Date: 04/23/2015      PT End of Session - 04/23/15 1443    Visit Number 1   Number of Visits 12   Date for PT Re-Evaluation 06/11/15   PT Start Time 0953   PT Stop Time 1044   PT Time Calculation (min) 51 min   Activity Tolerance Patient tolerated treatment well   Behavior During Therapy [facility] for tasks assessed/performed      Past Medical History  Diagnosis Date  . Diverticulitis   . Arthritis   . Asthma   . Mixed hyperlipidemia   . Essential hypertension, benign   . Tendinitis     Patient reports chronic problem involving her arms and hands  . Chronic back pain     Patient reports lower back disc problems  . Myocardial infarction [facility])     Patient reports related to "steroids" in 1985 ([place])  . High blood pressure   . High cholesterol   . Stroke (HCC)   . Degenerative lumbar disc   . Bursitis   . PMB (postmenopausal bleeding) 03/18/2014  . Enlarged uterus 03/18/2014  . Mixed stress and urge urinary incontinence 03/18/2014  . GERD (gastroesophageal reflux disease)   . Allergy   . Anemia   . Seizures [facility])     Past Surgical History  Procedure Laterality Date  . Cesarean section    . Tonsillectomy    . Adnoids    . Colonoscopy N/A 06/04/2014    SLF: 1. moderate diverticulosis in teh sigmoid colon, descending colon, and transverse colon. 2. Two colon polyps removed 3. Large external and small internal hemorroids.     There were no vitals filed for this visit.  Visit Diagnosis:  Bilateral low back pain without sciatica - Plan: PT plan of care cert/re-cert      Subjective Assessment - 04/23/15 1504    Subjective My back has hurt for years.   Limitations  Sitting;Standing;House hold activities   How long can you sit comfortably? 10 minutes.   How long can you stand comfortably? 10 minutes.   Currently in Pain? Yes   Pain Score 6    Pain Location Back   Pain Orientation Right;Left   Pain Descriptors / Indicators Aching   Pain Type Chronic pain   Pain Onset More than a month ago   Pain Frequency Constant   Aggravating Factors  Sitting, standing and ADL's.   Pain Relieving Factors Heat.            [facility] PT Assessment - 04/23/15 0001    Assessment   Medical Diagnosis Chronic low back pain.   Referring Provider Kevin Fenton MD.   Onset Date/Surgical Date --  Many years.   Precautions   Precaution Comments Patient using a rollator for safet.   Restrictions   Weight Bearing Restrictions No   Balance Screen   Has the patient fallen in the past 6 months No   Has the patient had a decrease in activity level because of a fear of falling?  No   Is the patient reluctant to leave their home because of a fear of falling?  No   Home Tourist information centre manager residence   Prior Function   Level of Independence  Independent   Posture/Postural Control   Posture/Postural Control Postural limitations   Postural Limitations Decreased lumbar lordosis   ROM / Strength   AROM / PROM / Strength AROM;Strength   AROM   Overall AROM Comments Active lumbar extension= 10 degrees and active flexion is 75% of normal.   Strength   Overall Strength Comments Normal bilateral LE strength.   Palpation   Palpation comment Very tender t palpation over lower lumbar erector spimae musculature.  The patient's lumbar musculature is diffusely taut to palpation.   Special Tests    Special Tests Lumbar;Leg LengthTest  Unable to elicit bilateral LE DTR's.   Lumbar Tests Straight Leg Raise   Leg length test  --  Equal.   Straight Leg Raise   Findings Negative   Side  --  Bilateral.   Ambulation/Gait   Gait Comments The patient is using a  Rollator for safety and she does so in some spinal flexion.                   [facility] Adult PT Treatment/Exercise - 04/23/15 0001    Modalities   Modalities Electrical Stimulation;Moist Heat   Moist Heat Therapy   Number Minutes Moist Heat 15 Minutes   Moist Heat Location --  Low back.   Programme researcher, broadcasting/film/video Location --  Low back.   Electrical Stimulation Action IFC   Electrical Stimulation Parameters 80-150 Hz at 100% scan x 15 minutes.                  PT Short Term Goals - 04/23/15 1456    PT SHORT TERM GOAL #1   Title Ind with a HEP.           PT Long Term Goals - 04/23/15 1501    PT LONG TERM GOAL #1   Title Sit 30 minutes with pain not > 4/10.   Time 4   Period Weeks   Status New   PT LONG TERM GOAL #2   Title Stand 20 minutes with pain not > 4/10.   Time 4   Period Weeks   Status New   PT LONG TERM GOAL #3   Title Perform ADL's with pain not > 4/10.               Plan - 04/23/15 1002    Clinical Impression Statement The patient has  along standing h/o low back pain dating back several years.  She reports pain across her low back and radiation of pain into her LE's left > right.  Her resting pain-level is rated at 5-6/10.  She has pain reaching near 10/10 with prolonged sitting, standing and bending.  heat decreases her pain-level.   Pt will benefit from skilled therapeutic intervention in order to improve on the following deficits Pain;Decreased activity tolerance;Decreased range of motion   Rehab Potential Good   PT Frequency 2x / week   PT Duration 6 weeks   PT Treatment/Interventions ADLs/Self Care Home Management;Electrical Stimulation;Moist Heat;Therapeutic exercise;Therapeutic activities;Ultrasound;Patient/family education;Manual techniques   PT Next Visit Plan Begin with low-level draw-in progression.  Modalities for pain relief and STW/M.   Consulted and Agree with Plan of Care Patient          Problem List Patient Active Problem List   Diagnosis Date Noted  . Chronic ischemic heart disease 01/07/2015  . Prediabetes 01/07/2015  . Healthcare maintenance 01/07/2015  . Diverticulitis large intestine 08/25/2014  . Acute colitis 08/02/2014  .  Change in bowel habits   . Chronic bilateral lower abdominal pain 05/13/2014  . PMB (postmenopausal bleeding) 03/18/2014  . Enlarged uterus 03/18/2014  . Mixed stress and urge urinary incontinence 03/18/2014  . Asthma, extrinsic 09/15/2013  . Dyspnea 04/18/2013  . Abnormal cardiovascular function study 01/21/2013  . Precordial pain 12/10/2012  . Essential hypertension, benign 12/10/2012  . Mixed hyperlipidemia 12/10/2012    Creasie Lacosse, [place] MPT 04/23/2015, 3:17 PM  [facility] [address] [place], [place], [address] Phone: [phone]   Fax:  [phone]  Name: Lisa [name] MRN: [id number] Date of Birth: [birth year]

## 2015-04-30 ENCOUNTER — Ambulatory Visit: Payer: Self-pay | Admitting: Physical Therapy

## 2015-04-30 DIAGNOSIS — M545 Low back pain, unspecified: Secondary | ICD-10-CM

## 2015-04-30 NOTE — Therapy (Signed)
[facility] [address] [place], [place], [address] Phone: [phone]   Fax:  [phone]  Physical Therapy Treatment  Patient Details  Name: Lisa [name] MRN: [id number] Date of Birth: April 23, 1960 Referring Provider: Kevin Fenton MD.  Encounter Date: 04/30/2015      PT End of Session - 04/30/15 1359    Visit Number 2   Number of Visits 12   Date for PT Re-Evaluation 06/11/15   PT Start Time 1044   PT Stop Time 1129   PT Time Calculation (min) 45 min      Past Medical History  Diagnosis Date  . Diverticulitis   . Arthritis   . Asthma   . Mixed hyperlipidemia   . Essential hypertension, benign   . Tendinitis     Patient reports chronic problem involving her arms and hands  . Chronic back pain     Patient reports lower back disc problems  . Myocardial infarction [facility])     Patient reports related to "steroids" in 1985 ([place])  . High blood pressure   . High cholesterol   . Stroke (HCC)   . Degenerative lumbar disc   . Bursitis   . PMB (postmenopausal bleeding) 03/18/2014  . Enlarged uterus 03/18/2014  . Mixed stress and urge urinary incontinence 03/18/2014  . GERD (gastroesophageal reflux disease)   . Allergy   . Anemia   . Seizures [facility])     Past Surgical History  Procedure Laterality Date  . Cesarean section    . Tonsillectomy    . Adnoids    . Colonoscopy N/A 06/04/2014    SLF: 1. moderate diverticulosis in teh sigmoid colon, descending colon, and transverse colon. 2. Two colon polyps removed 3. Large external and small internal hemorroids.     There were no vitals filed for this visit.  Visit Diagnosis:  Bilateral low back pain without sciatica      Subjective Assessment - 04/30/15 1431    Subjective No new complaints.   Pain Score 7    Pain Location Back   Pain Orientation Right;Left   Pain Descriptors / Indicators Aching   Pain Type Chronic pain   Pain Onset More than a month ago                          [facility] Adult PT Treatment/Exercise - 04/30/15 0001    Modalities   Modalities Ultrasound   Moist Heat Therapy   Number Minutes Moist Heat 12 Minutes   Moist Heat Location --  LB.   Emergency planning/management officer --  LB   Statistician Action --  IFC   Electrical Stimulation Parameters --  80-150 Hz x 12 minutes.   Ultrasound   Ultrasound Location --  RT LB (CC today).   Ultrasound Parameters Left sdly with pillow between knees for comfort while receiving U/S at 1.50 W/CM2 x 12 minutes.   Ultrasound Goals Pain   Manual Therapy   Manual therapy comments STW/M to affected right LB x 12 minutes.                  PT Short Term Goals - 04/23/15 1456    PT SHORT TERM GOAL #1   Title Ind with a HEP.           PT Long Term Goals - 04/23/15 1501    PT LONG TERM GOAL #1   Title Sit 30 minutes  with pain not > 4/10.   Time 4   Period Weeks   Status New   PT LONG TERM GOAL #2   Title Stand 20 minutes with pain not > 4/10.   Time 4   Period Weeks   Status New   PT LONG TERM GOAL #3   Title Perform ADL's with pain not > 4/10.               Problem List Patient Active Problem List   Diagnosis Date Noted  . Chronic ischemic heart disease 01/07/2015  . Prediabetes 01/07/2015  . Healthcare maintenance 01/07/2015  . Diverticulitis large intestine 08/25/2014  . Acute colitis 08/02/2014  . Change in bowel habits   . Chronic bilateral lower abdominal pain 05/13/2014  . PMB (postmenopausal bleeding) 03/18/2014  . Enlarged uterus 03/18/2014  . Mixed stress and urge urinary incontinence 03/18/2014  . Asthma, extrinsic 09/15/2013  . Dyspnea 04/18/2013  . Abnormal cardiovascular function study 01/21/2013  . Precordial pain 12/10/2012  . Essential hypertension, benign 12/10/2012  . Mixed hyperlipidemia 12/10/2012    Larissa Pegg, [place] MPT 04/30/2015, 2:38 PM  [facility] [address] [place], [place], [address] Phone: [phone]   Fax:  [phone]  Name: Lisa [name] MRN: [id number] Date of Birth: 02/25/1961

## 2015-05-07 ENCOUNTER — Ambulatory Visit: Payer: Self-pay | Admitting: Physical Therapy

## 2015-05-07 DIAGNOSIS — M545 Low back pain, unspecified: Secondary | ICD-10-CM

## 2015-05-07 NOTE — Therapy (Signed)
[facility] [address] [place], [place], [address] Phone: [phone]   Fax:  [phone]  Physical Therapy Treatment  Patient Details  Name: Lisa [name] MRN: [id number] Date of Birth: August 26, 1960 Referring Provider: Kevin Fenton MD.  Encounter Date: 05/07/2015      PT End of Session - 05/07/15 1238    Date for PT Re-Evaluation 06/11/15      Past Medical History  Diagnosis Date  . Diverticulitis   . Arthritis   . Asthma   . Mixed hyperlipidemia   . Essential hypertension, benign   . Tendinitis     Patient reports chronic problem involving her arms and hands  . Chronic back pain     Patient reports lower back disc problems  . Myocardial infarction [facility])     Patient reports related to "steroids" in 1985 ([place])  . High blood pressure   . High cholesterol   . Stroke (HCC)   . Degenerative lumbar disc   . Bursitis   . PMB (postmenopausal bleeding) 03/18/2014  . Enlarged uterus 03/18/2014  . Mixed stress and urge urinary incontinence 03/18/2014  . GERD (gastroesophageal reflux disease)   . Allergy   . Anemia   . Seizures [facility])     Past Surgical History  Procedure Laterality Date  . Cesarean section    . Tonsillectomy    . Adnoids    . Colonoscopy N/A 06/04/2014    SLF: 1. moderate diverticulosis in teh sigmoid colon, descending colon, and transverse colon. 2. Two colon polyps removed 3. Large external and small internal hemorroids.     There were no vitals filed for this visit.  Visit Diagnosis:  Bilateral low back pain without sciatica      Subjective Assessment - 05/07/15 1231    Subjective My pain is 7/10 today.   Limitations Sitting;Standing;House hold activities   How long can you sit comfortably? 10 minutes.   How long can you stand comfortably? 10 minutes.   Pain Score 7    Pain Location Back   Pain Orientation Left   Pain Descriptors / Indicators Aching   Pain Type Chronic pain                          [facility] Adult PT Treatment/Exercise - 05/07/15 0001    Exercises   Exercises Lumbar;Knee/Hip   Lumbar Exercises: Stretches   Single Knee to Chest Stretch Limitations 30 sec holds 5 reps each side.   Pelvic Tilt Limitations Draw-ins with 5 sec holds x 10 reps.   Lumbar Exercises: Supine   Dead Bug Limitations Hip bridges x 15 reps.   Other Supine Lumbar Exercises Draw-ins with steps to fatigue.   Knee/Hip Exercises: Aerobic   Nustep Level 3 x 15 minutes.   Manual Therapy   Manual Therapy Soft tissue mobilization   Manual therapy comments Right sdly position with folded pillow between knees while patient received STW/M to her left low back region x 15 minutes.                  PT Short Term Goals - 04/23/15 1456    PT SHORT TERM GOAL #1   Title Ind with a HEP.           PT Long Term Goals - 05/07/15 1237    PT LONG TERM GOAL #1   Title Sit 30 minutes with pain not > 4/10.   Time 4   Period  Weeks   Status On-going   PT LONG TERM GOAL #2   Title Stand 20 minutes with pain not > 4/10.   Time 4   Period Weeks   Status On-going   PT LONG TERM GOAL #3   Title Perform ADL's with pain not > 4/10.   Time 4   Period Weeks   Status On-going               Plan - 05/07/15 1236    Clinical Impression Statement Patient did well with low-level core exercises today.  She continues to reports higher pain-levels though with a pain rating of 7/10 today.   Pt will benefit from skilled therapeutic intervention in order to improve on the following deficits Pain;Decreased activity tolerance;Decreased range of motion   PT Duration 6 weeks   PT Treatment/Interventions ADLs/Self Care Home Management;Electrical Stimulation;Moist Heat;Therapeutic exercise;Therapeutic activities;Ultrasound;Patient/family education;Manual techniques   PT Next Visit Plan Begin with low-level draw-in progression.  Modalities for pain relief and STW/M.         Problem List Patient Active Problem List   Diagnosis Date Noted  . Chronic ischemic heart disease 01/07/2015  . Prediabetes 01/07/2015  . Healthcare maintenance 01/07/2015  . Diverticulitis large intestine 08/25/2014  . Acute colitis 08/02/2014  . Change in bowel habits   . Chronic bilateral lower abdominal pain 05/13/2014  . PMB (postmenopausal bleeding) 03/18/2014  . Enlarged uterus 03/18/2014  . Mixed stress and urge urinary incontinence 03/18/2014  . Asthma, extrinsic 09/15/2013  . Dyspnea 04/18/2013  . Abnormal cardiovascular function study 01/21/2013  . Precordial pain 12/10/2012  . Essential hypertension, benign 12/10/2012  . Mixed hyperlipidemia 12/10/2012    Todrick Siedschlag, [place] MPT 05/07/2015, 12:40 PM  [facility] [address] [place], [place], [address] Phone: [phone]   Fax:  [phone]  Name: ROWAN POLLMAN MRN: [id number] Date of Birth: 09/04/1960

## 2015-05-14 ENCOUNTER — Encounter: Payer: Self-pay | Admitting: Physical Therapy

## 2015-05-21 ENCOUNTER — Ambulatory Visit: Payer: Self-pay | Attending: Family Medicine | Admitting: Physical Therapy

## 2015-05-21 DIAGNOSIS — M545 Low back pain, unspecified: Secondary | ICD-10-CM

## 2015-05-21 NOTE — Therapy (Signed)
[facility] [address] [place], [place], [address] Phone: [phone]   Fax:  [phone]  Physical Therapy Treatment  Patient Details  Name: Lisa [name] MRN: [id number] Date of Birth: Jan 05, 1961 Referring Provider: Kevin FentonSamuel Bradshaw MD.  Encounter Date: 05/21/2015    Past Medical History  Diagnosis Date  . Diverticulitis   . Arthritis   . Asthma   . Mixed hyperlipidemia   . Essential hypertension, benign   . Tendinitis     Patient reports chronic problem involving her arms and hands  . Chronic back pain     Patient reports lower back disc problems  . Myocardial infarction [facility])     Patient reports related [place] "[place]" in [place] ([place]  . High blood pressure   . High cholesterol   . Stroke (HCC)   . Degenerative lumbar disc   . Bursitis   . PMB (postmenopausal bleeding) 03/18/2014  . Enlarged uterus 03/18/2014  . Mixed stress and urge urinary incontinence 03/18/2014  . GERD (gastroesophageal reflux disease)   . Allergy   . Anemia   . Seizures [facility](HCC)     Past Surgical History  Procedure Laterality Date  . Cesarean section    . Tonsillectomy    . Adnoids    . Colonoscopy N/A 06/04/2014    SLF: 1. moderate diverticulosis in teh sigmoid colon, descending colon, and transverse colon. 2. Two colon polyps removed 3. Large external and small internal hemorroids.     There were no vitals filed for this visit.  Visit Diagnosis:  Bilateral low back pain without sciatica      Subjective Assessment - 05/21/15 1252    Subjective Pain is a 5/10.   Limitations Sitting;Standing;House hold activities   How long can you sit comfortably? 10 minutes.   How long can you stand comfortably? 10 minutes.   Pain Score 5    Pain Location Back   Pain Orientation Right;Left   Pain Descriptors / Indicators Aching   Pain Type Chronic pain   Pain Onset More than a month ago                         [facility]  Treatment/Exercise - 05/21/15 1254    Manual Therapy   Manual therapy comments Left sdly position with folded pillow between knees while patient received STW/M to her bil low back region x 15 minutes.     Nustep level x 15 minutes  Moist Heat and IFC at 100% scan at 80-150 HZ x 20 minutes.  Patient tolerated treatment without complaint.             PT Short Term Goals - 04/23/15 1456    PT SHORT TERM GOAL #1   Title Ind with a HEP.           PT Long Term Goals - 05/21/15 1304    PT LONG TERM GOAL #1   Title Sit 30 minutes with pain not > 4/10.   Time 4   Period Weeks   Status On-going   PT LONG TERM GOAL #2   Title Stand 20 minutes with pain not > 4/10.   Time 4   Period Weeks   Status On-going   PT LONG TERM GOAL #3   Title Perform ADL's with pain not > 4/10.   Time 4   Period Weeks   Status On-going  Plan - 05/21/15 1302    Clinical Impression Statement Patient with a 5/10 pain-level today.  She has been out of physical therapy for 2 weeks due to sickness.   Pt will benefit from skilled therapeutic intervention in order to improve on the following deficits Pain;Decreased activity tolerance;Decreased range of motion   Rehab Potential Good   PT Frequency 2x / week   PT Duration 6 weeks   PT Treatment/Interventions ADLs/Self Care Home Management;Electrical Stimulation;Moist Heat;Therapeutic exercise;Therapeutic activities;Ultrasound;Patient/family education;Manual techniques   PT Next Visit Plan Begin with low-level draw-in progression.  Modalities for pain relief and STW/M.        Problem List Patient Active Problem List   Diagnosis Date Noted  . Chronic ischemic heart disease 01/07/2015  . Prediabetes 01/07/2015  . Healthcare maintenance 01/07/2015  . Diverticulitis large intestine 08/25/2014  . Acute colitis 08/02/2014  . Change in bowel habits   . Chronic bilateral lower abdominal pain 05/13/2014  . PMB (postmenopausal  bleeding) 03/18/2014  . Enlarged uterus 03/18/2014  . Mixed stress and urge urinary incontinence 03/18/2014  . Asthma, extrinsic 09/15/2013  . Dyspnea 04/18/2013  . Abnormal cardiovascular function study 01/21/2013  . Precordial pain 12/10/2012  . Essential hypertension, benign 12/10/2012  . Mixed hyperlipidemia 12/10/2012    APPLEGATE, [place] MPT 05/21/2015, 1:04 PM  [facility] [address] [place], [place], [address] Phone: [phone]   Fax:  [phone]  Name: Lisa [name] MRN: [id number] Date of Birth: [birth year]

## 2015-05-27 ENCOUNTER — Encounter: Payer: Self-pay | Admitting: Physical Therapy

## 2015-06-02 ENCOUNTER — Encounter: Payer: Self-pay | Admitting: Gastroenterology

## 2015-06-04 ENCOUNTER — Encounter: Payer: Self-pay | Admitting: Physical Therapy

## 2015-06-07 ENCOUNTER — Encounter: Payer: Self-pay | Admitting: Gastroenterology

## 2015-06-10 ENCOUNTER — Encounter: Payer: Self-pay | Admitting: Physical Therapy

## 2015-06-10 ENCOUNTER — Ambulatory Visit: Payer: Self-pay | Admitting: Physical Therapy

## 2015-06-10 DIAGNOSIS — M545 Low back pain, unspecified: Secondary | ICD-10-CM

## 2015-06-10 NOTE — Patient Instructions (Signed)

## 2015-06-10 NOTE — Therapy (Signed)
[facility] [place], [place], [address] Phone: [phone]   Fax:  [phone]  Physical Therapy Treatment  Patient Details  Name: Lisa [name] MRN: [id number] Date of Birth: Jul 31, 1960 Referring Provider: Kenn File MD.  Encounter Date: 06/10/2015      PT End of Session - 06/10/15 1321    Visit Number 4   Number of Visits 12   Date for PT Re-Evaluation 06/11/15   PT Start Time 1238   PT Stop Time 1329   PT Time Calculation (min) 51 min   Activity Tolerance Patient tolerated treatment well;Patient limited by pain   Behavior During Therapy [facility] for tasks assessed/performed      Past Medical History  Diagnosis Date  . Diverticulitis   . Arthritis   . Asthma   . Mixed hyperlipidemia   . Essential hypertension, benign   . Tendinitis     Patient reports chronic problem involving her arms and hands  . Chronic back pain     Patient reports lower back disc problems  . Myocardial infarction [employer])     Patient reports related to "steroids" in 1985 ([place])  . High blood pressure   . High cholesterol   . Stroke ([place])   . Degenerative lumbar disc   . Bursitis   . PMB (postmenopausal bleeding) 03/18/2014  . Enlarged uterus 03/18/2014  . Mixed stress and urge urinary incontinence 03/18/2014  . GERD (gastroesophageal reflux disease)   . Allergy   . Anemia   . Seizures [facility])     Past Surgical History  Procedure Laterality Date  . Cesarean section    . Tonsillectomy    . Adnoids    . Colonoscopy N/A 06/04/2014    SLF: 1. moderate diverticulosis in teh sigmoid colon, descending colon, and transverse colon. 2. Two colon polyps removed 3. Large external and small internal hemorroids.     There were no vitals filed for this visit.  Visit Diagnosis:  Bilateral low back pain without sciatica      Subjective Assessment - 06/10/15 1246    Subjective no complaints after last treatment.   Limitations  Sitting;Standing;House hold activities   How long can you sit comfortably? 10 minutes.   How long can you stand comfortably? 10 minutes.   Currently in Pain? Yes   Pain Score 8    Pain Location Back   Pain Orientation Right;Left;Lower   Pain Descriptors / Indicators Aching;Sore   Pain Type Chronic pain   Pain Onset More than a month ago   Pain Frequency Constant   Aggravating Factors  increased activity   Pain Relieving Factors at rest or heat                         [facility] Adult PT Treatment/Exercise - 06/10/15 0001    Self-Care   Self-Care ADL's;Lifting;Posture   ADL's brushing teeth, vacuum, mopping, laundry, sleeping, logroll   Lifting in all positions, bending technique   Posture all positions   Lumbar Exercises: Aerobic   Stationary Bike Nustep L3 79mn with posture focus, monitored for progression   Lumbar Exercises: Supine   Ab Set 20 reps;5 seconds   Bent Knee Raise 3 seconds  2x15 with small range and holds   Moist Heat Therapy   Number Minutes Moist Heat 15 Minutes   Moist Heat Location Lumbar Spine   Electrical Stimulation   Electrical Stimulation Location low back   Electrical Stimulation Action IFC  Electrical Stimulation Parameters 80-150hz    Electrical Stimulation Goals Pain                PT Education - 06/10/15 1258    Education provided Yes   Education Details HEP for posture awareness techniques   Person(s) Educated Patient   Methods Explanation;Demonstration;Handout   Comprehension Verbalized understanding;Returned demonstration          PT Short Term Goals - 06/10/15 1322    PT SHORT TERM GOAL #1   Title Ind with a HEP.   Status Achieved  06/10/15           PT Long Term Goals - 05/21/15 1304    PT LONG TERM GOAL #1   Title Sit 30 minutes with pain not > 4/10.   Time 4   Period Weeks   Status On-going   PT LONG TERM GOAL #2   Title Stand 20 minutes with pain not > 4/10.   Time 4   Period Weeks   Status  On-going   PT LONG TERM GOAL #3   Title Perform ADL's with pain not > 4/10.   Time 4   Period Weeks   Status On-going               Plan - 06/10/15 1323    Clinical Impression Statement Patient making progress today. Patient limited by pain yet progressed with good understanding of posture awareness techniques today. Educated patient with HEP on all activities. Patient understands core activation / draw ins to stabalize and strengthen back. patient met STG #1 and other LTG's ongoing due to pain deficits   Pt will benefit from skilled therapeutic intervention in order to improve on the following deficits Pain;Decreased activity tolerance;Decreased range of motion   Rehab Potential Good   PT Frequency 2x / week   PT Duration 6 weeks   PT Treatment/Interventions ADLs/Self Care Home Management;Electrical Stimulation;Moist Heat;Therapeutic exercise;Therapeutic activities;Ultrasound;Patient/family education;Manual techniques   PT Next Visit Plan Begin with low-level draw-in progression.  Modalities for pain relief and STW/M.   Consulted and Agree with Plan of Care Patient        Problem List Patient Active Problem List   Diagnosis Date Noted  . Chronic ischemic heart disease 01/07/2015  . Prediabetes 01/07/2015  . Healthcare maintenance 01/07/2015  . Diverticulitis large intestine 08/25/2014  . Acute colitis 08/02/2014  . Change in bowel habits   . Chronic bilateral lower abdominal pain 05/13/2014  . PMB (postmenopausal bleeding) 03/18/2014  . Enlarged uterus 03/18/2014  . Mixed stress and urge urinary incontinence 03/18/2014  . Asthma, extrinsic 09/15/2013  . Dyspnea 04/18/2013  . Abnormal cardiovascular function study 01/21/2013  . Precordial pain 12/10/2012  . Essential hypertension, benign 12/10/2012  . Mixed hyperlipidemia 12/10/2012    Lisa [name] P, PTA 06/10/2015, 1:35 PM  [facility] [address] [place], [place], [address] Phone: [phone]   Fax:  [phone]  Name: Lisa [name] MRN: [id number] Date of Birth: 06-26-1960

## 2015-06-18 ENCOUNTER — Ambulatory Visit: Payer: Self-pay | Attending: Family Medicine | Admitting: Physical Therapy

## 2015-06-18 DIAGNOSIS — M545 Low back pain, unspecified: Secondary | ICD-10-CM

## 2015-06-18 NOTE — Therapy (Signed)
[facility] [address] [place], [place], [address] Phone: [phone]   Fax:  [phone]  Physical Therapy Treatment  Patient Details  Name: Lisa [name] MRN: [id number] Date of Birth: [birth year] Referring Provider: Kevin FentonSamuel Bradshaw MD.  Encounter Date: 06/18/2015      PT End of Session - 06/18/15 1252    Visit Number 5   Number of Visits 12   Date for PT Re-Evaluation 07/23/15  Sending update to Dr. for re-cert.   PT Start Time 1025   PT Stop Time 1120   PT Time Calculation (min) 55 min   Activity Tolerance Patient tolerated treatment well;Patient limited by pain   Behavior During Therapy [facility] for tasks assessed/performed      Past Medical History  Diagnosis Date  . Diverticulitis   . Arthritis   . Asthma   . Mixed hyperlipidemia   . Essential hypertension, benign   . Tendinitis     Patient reports chronic problem involving her arms and hands  . Chronic back pain     Patient reports lower back disc problems  . Myocardial infarction [facility](HCC)     Patient reports related to "[place]" in [place])  . High blood pressure   . High cholesterol   . Stroke ([facility])   . Degenerative lumbar disc   . Bursitis   . PMB (postmenopausal bleeding) 03/18/2014  . Enlarged uterus 03/18/2014  . Mixed stress and urge urinary incontinence 03/18/2014  . GERD (gastroesophageal reflux disease)   . Allergy   . Anemia   . Seizures [facility](HCC)     Past Surgical History  Procedure Laterality Date  . Cesarean section    . Tonsillectomy    . Adnoids    . Colonoscopy N/A 06/04/2014    SLF: 1. moderate diverticulosis in teh sigmoid colon, descending colon, and transverse colon. 2. Two colon polyps removed 3. Large external and small internal hemorroids.     There were no vitals filed for this visit.      Subjective Assessment - 06/18/15 1240    Subjective My pain was a 12 yesterday after doing a lot of stuff around the house.  I'm at a 6/10 today  and therapy is helping me.  "I'm about 20% better overall."   Limitations Sitting;Standing;House hold activities   How long can you sit comfortably? 10 minutes.   How long can you stand comfortably? 10 minutes.   Pain Score 6    Pain Location Back   Pain Orientation Right;Left;Lower   Pain Descriptors / Indicators Aching;Sore   Pain Type Chronic pain   Pain Onset More than a month ago            [facility] - 06/18/15 0001    AROM   Overall AROM Comments 15 degrees of active lumbar extension.                     [facility] Adult PT Treatment/Exercise - 06/18/15 0001    Exercises   Exercises Knee/Hip   Lumbar Exercises: Aerobic   Stationary Bike Nustep x 19 minutes beginning at level 5 and progressing to level 5 and moving seat forward x 2 to increase hip flexion.   Lumbar Exercises: Standing   Other Standing Lumbar Exercises Lumbar active extension x 3 minutes   Other Standing Lumbar Exercises Supine hip bridges x 1 minute   Moist Heat Therapy   Number Minutes Moist Heat 20 Minutes   Moist Heat Location --  Lumbar   Electrical Stimulation   Electrical Stimulation Location Lumbar    Electrical Stimulation Action Constant pre-mod e'stim   Electrical Stimulation Parameters 80-150 Hz x 20 minutes.                  PT Short Term Goals - 06/10/15 1322    PT SHORT TERM GOAL #1   Title Ind with a HEP.   Status Achieved  06/10/15           PT Long Term Goals - 06/18/15 1304    PT LONG TERM GOAL #1   Title Sit 30 minutes with pain not > 4/10.   Time 4   Period Weeks   Status On-going   PT LONG TERM GOAL #2   Title Stand 20 minutes with pain not > 4/10.   Time 4   Period Weeks   Status On-going   PT LONG TERM GOAL #3   Title Perform ADL's with pain not > 4/10.   Time 4               Plan - 06/18/15 1257    Clinical Impression Statement My pain was a 12 yesterday after doing a lot of stuff around the house.  I'm at a 6/10 today  and therapy is helping me.  "I'm about 20% better overall."  She is very pleased with how she is doing thus far.   Rehab Potential Good   PT Frequency 2x / week   PT Duration 6 weeks   PT Treatment/Interventions ADLs/Self Care Home Management;Electrical Stimulation;Moist Heat;Therapeutic exercise;Therapeutic activities;Ultrasound;Patient/family education;Manual techniques   PT Next Visit Plan Progress with core exercises.   Consulted and Agree with Plan of Care Patient      Patient will benefit from skilled therapeutic intervention in order to improve the following deficits and impairments:  Pain, Decreased activity tolerance, Decreased range of motion  Visit Diagnosis: Midline low back pain without sciatica - Plan: PT plan of care cert/re-cert     Problem List Patient Active Problem List   Diagnosis Date Noted  . Chronic ischemic heart disease 01/07/2015  . Prediabetes 01/07/2015  . Healthcare maintenance 01/07/2015  . Diverticulitis large intestine 08/25/2014  . Acute colitis 08/02/2014  . Change in bowel habits   . Chronic bilateral lower abdominal pain 05/13/2014  . PMB (postmenopausal bleeding) 03/18/2014  . Enlarged uterus 03/18/2014  . Mixed stress and urge urinary incontinence 03/18/2014  . Asthma, extrinsic 09/15/2013  . Dyspnea 04/18/2013  . Abnormal cardiovascular function study 01/21/2013  . Precordial pain 12/10/2012  . Essential hypertension, benign 12/10/2012  . Mixed hyperlipidemia 12/10/2012    [place], [place] 06/18/2015, 1:08 PM  [facility] [address] [place], [place], [address] Phone: [phone]   Fax:  [phone]  Name: Lisa [name] MRN: [id number] Date of Birth: 06/18/1960

## 2015-06-22 ENCOUNTER — Telehealth: Payer: Self-pay | Admitting: Emergency Medicine

## 2015-06-22 NOTE — Telephone Encounter (Signed)
LVM for pt to return call

## 2015-06-23 ENCOUNTER — Ambulatory Visit: Payer: Self-pay | Admitting: Physical Therapy

## 2015-06-23 DIAGNOSIS — M545 Low back pain, unspecified: Secondary | ICD-10-CM

## 2015-06-23 NOTE — Telephone Encounter (Signed)
Patient returned call, CB [phone].  Has an appt at 9:50 and should be free by 11:15 am.

## 2015-06-23 NOTE — Telephone Encounter (Signed)
Spoke with pt. States her apartment complex will be sending over a form for RB to sign. Advised her that I would be on the look out for this and I will have RB sign. Form has not been received yet. Will route to my box to follow up on.

## 2015-06-23 NOTE — Therapy (Addendum)
[facility] [address] [place], [place], [address] Phone: [phone]   Fax:  [phone]  Physical Therapy Treatment  Patient Details  Name: Lisa [name] MRN: [id number] Date of Birth: 01-20-1961 Referring Provider: Kevin Fenton MD.  Encounter Date: 06/23/2015      PT End of Session - 06/23/15 1028    Visit Number 6   Number of Visits 12   Date for PT Re-Evaluation 07/23/15   PT Start Time 0945   PT Stop Time --  2 unit treatment due to activity intolerance today.   Activity Tolerance Patient limited by fatigue   Behavior During Therapy [facility] for tasks assessed/performed      Past Medical History  Diagnosis Date  . Diverticulitis   . Arthritis   . Asthma   . Mixed hyperlipidemia   . Essential hypertension, benign   . Tendinitis     Patient reports chronic problem involving her arms and hands  . Chronic back pain     Patient reports lower back disc problems  . Myocardial infarction [facility])     Patient reports related to "steroids" in 1985 ([place])  . High blood pressure   . High cholesterol   . Stroke ([facility])   . Degenerative lumbar disc   . Bursitis   . PMB (postmenopausal bleeding) 03/18/2014  . Enlarged uterus 03/18/2014  . Mixed stress and urge urinary incontinence 03/18/2014  . GERD (gastroesophageal reflux disease)   . Allergy   . Anemia   . Seizures [facility])     Past Surgical History  Procedure Laterality Date  . Cesarean section    . Tonsillectomy    . Adnoids    . Colonoscopy N/A 06/04/2014    SLF: 1. moderate diverticulosis in teh sigmoid colon, descending colon, and transverse colon. 2. Two colon polyps removed 3. Large external and small internal hemorroids.     There were no vitals filed for this visit.      Subjective Assessment - 06/23/15 1025    Subjective My left hip is hurting a lot today.   Pain Score 8    Pain Location Hip   Pain Orientation Left   Pain Descriptors / Indicators Sharp   Pain  Type Chronic pain   Pain Onset More than a month ago                                   PT Short Term Goals - 06/10/15 1322    PT SHORT TERM GOAL #1   Title Ind with a HEP.   Status Achieved  06/10/15           PT Long Term Goals - 06/18/15 1304    PT LONG TERM GOAL #1   Title Sit 30 minutes with pain not > 4/10.   Time 4   Period Weeks   Status On-going   PT LONG TERM GOAL #2   Title Stand 20 minutes with pain not > 4/10.   Time 4   Period Weeks   Status On-going   PT LONG TERM GOAL #3   Title Perform ADL's with pain not > 4/10.   Time 4             Patient will benefit from skilled therapeutic intervention in order to improve the following deficits and impairments:     Visit Diagnosis: Midline low back pain without sciatica  Problem List Patient Active Problem List   Diagnosis Date Noted  . Chronic ischemic heart disease 01/07/2015  . Prediabetes 01/07/2015  . Healthcare maintenance 01/07/2015  . Diverticulitis large intestine 08/25/2014  . Acute colitis 08/02/2014  . Change in bowel habits   . Chronic bilateral lower abdominal pain 05/13/2014  . PMB (postmenopausal bleeding) 03/18/2014  . Enlarged uterus 03/18/2014  . Mixed stress and urge urinary incontinence 03/18/2014  . Asthma, extrinsic 09/15/2013  . Dyspnea 04/18/2013  . Abnormal cardiovascular function study 01/21/2013  . Precordial pain 12/10/2012  . Essential hypertension, benign 12/10/2012  . Mixed hyperlipidemia 12/10/2012   Nustep level 4 x 10 minutes f/b rockerboard in parallel bars x 6 minutes with draw-in for core stability.  Patient did not feel she could continue with any more exercise today.  She then received HMP and constant pre-mod e'stim at 80-150 Hz x 20 minutes.  Lecia Esperanza, [place]  MPT  06/23/2015, 10:36 AM  [facility] [address] [place], [place], [address] Phone: [phone]   Fax:   [phone]  Name: Lisa [name] MRN: [id number] Date of Birth: Jan 30, 1961

## 2015-06-28 NOTE — Telephone Encounter (Signed)
Pt is returning call that she missed. Please call her back at [phone]. Said if she doesn't answer the first time, try again. They are having a lot of construction going on

## 2015-06-28 NOTE — Telephone Encounter (Signed)
Patient notified that we have not received forms from apartment complex.   Patient states that she will get them to send the forms To Lisa [name] for Follow up

## 2015-06-28 NOTE — Telephone Encounter (Signed)
This form still has not been received. lmtcb x1 for pt.

## 2015-06-29 NOTE — Telephone Encounter (Signed)
Pt returning call gave her the info below and she say she will have to rty and arrange for someone to bring her in to the office b/c she has no transportation and that she would call us back.Lisa GriffinsStanley A Dalton

## 2015-06-29 NOTE — Telephone Encounter (Signed)
Noted. Message to be closed as Nothing further needed..Marland Kitchen

## 2015-06-29 NOTE — Telephone Encounter (Signed)
Forms will be placed up front with pending appointment forms.

## 2015-06-29 NOTE — Telephone Encounter (Signed)
Made appt for 04/24 with [place] [facility]

## 2015-06-29 NOTE — Telephone Encounter (Signed)
Lisa AbedLindsay, I spoke with pt.  She would like to keep appt with SG d/t transportation.  Will you please hold onto forms until OV or place at front with pending appt forms?  Thank you.

## 2015-06-29 NOTE — Telephone Encounter (Addendum)
Form was received. We have not seen pt since 2015. She will need to be seen if she wants this filled out by our office. lmtcb x1 for pt.  *Form has been placed in RB's 'review/FYI' folder.

## 2015-06-30 ENCOUNTER — Encounter: Payer: Self-pay | Admitting: Gastroenterology

## 2015-06-30 ENCOUNTER — Ambulatory Visit (INDEPENDENT_AMBULATORY_CARE_PROVIDER_SITE_OTHER): Payer: Self-pay | Admitting: Gastroenterology

## 2015-06-30 VITALS — BP 162/87 | HR 60 | Temp 97.0°F | Ht 60.0 in | Wt 182.2 lb

## 2015-06-30 DIAGNOSIS — K648 Other hemorrhoids: Secondary | ICD-10-CM

## 2015-06-30 DIAGNOSIS — K5733 Diverticulitis of large intestine without perforation or abscess with bleeding: Secondary | ICD-10-CM

## 2015-06-30 DIAGNOSIS — K644 Residual hemorrhoidal skin tags: Secondary | ICD-10-CM | POA: Insufficient documentation

## 2015-06-30 DIAGNOSIS — K219 Gastro-esophageal reflux disease without esophagitis: Secondary | ICD-10-CM

## 2015-06-30 DIAGNOSIS — K589 Irritable bowel syndrome without diarrhea: Secondary | ICD-10-CM

## 2015-06-30 MED ORDER — LINACLOTIDE 72 MCG PO CAPS: 72.0000 ug | ORAL_CAPSULE | Freq: Every day | ORAL | Status: DC

## 2015-06-30 MED ORDER — DEXLANSOPRAZOLE 60 MG PO CPDR: 60.0000 mg | DELAYED_RELEASE_CAPSULE | Freq: Every day | ORAL | Status: DC

## 2015-06-30 NOTE — Patient Instructions (Signed)
1. Start Dexilant 60 mg half an hour before breakfast daily for acid reflux. Samples provided. Please complete patient assistant forms a return for office.  2. Start Linzess 72 g daily 30 minutes before breakfast. This is for constipation. Please complete patient assistance forms and return to our office. 3. Please let me know when you're ready for surgical referral for recurrent diverticulitis and hemorrhoids.

## 2015-06-30 NOTE — [place] Notes (Signed)
Primary Care Physician: Kevin FentonSamuel Bradshaw, MD  Primary Gastroenterologist:  Jonette EvaSandi Fields, MD   Chief Complaint  Patient presents with  . Abdominal Pain  . Constipation    HPI: Lisa [name] is a 55 y.o. female with history of recurrent diverticulitis, IBS-C, hemorrhoids, GERD who presents for follow-up. Last seen in October 2016. Advised to pursue elective partial colectomy the patient was unable to afford surgery being uninsured. 2 documented episodes of diverticulitis in 2016 via CT scan (May and September).   Complains of ongoing issues with her hemorrhoids. Has history of large external hemorrhoids/small internal hemorrhoids at time of colonoscopy. Planes of swelling, itching. Complains of ongoing constipation. Utilizes Linzess 145 g daily as needed. Notes a cause watery stool when she takes it. Tries to use fresh fruits but has difficulty affording. Using suppositories at times for constipation. Continues to use cream for hemorrhoids. Ran out of lactulose but didn't really like it. Complains of chronic indigestion. States she has had it since she was very young. She never reported this to as previously. Does not utilize any medication for it. Tries food avoidance and relieve stress. No prior EGD.  Reminds me that she does not have a car. She relies on her neighbor or family for transportation, especially if out of the general facility.  Current Outpatient Prescriptions  Medication Sig Dispense Refill  . albuterol (PROVENTIL HFA;VENTOLIN HFA) 108 (90 BASE) MCG/ACT inhaler Inhale 2 puffs into the lungs every 6 (six) hours as needed for wheezing.    Marland Kitchen. aspirin EC 81 MG tablet Take 81 mg by mouth daily.    Marland Kitchen. atorvastatin (LIPITOR) 20 MG tablet Take 1 tablet (20 mg total) by mouth daily. 90 tablet 3  . BEE POLLEN PO Take by mouth.    . DimenhyDRINATE (EQ MOTION SICKNESS PO) Apply 1 application topically as needed (for nausea/vomiting).    . hydrochlorothiazide (HYDRODIURIL) 25 MG  tablet Take 1 tablet (25 mg total) by mouth daily. 30 tablet 5  . Linaclotide (LINZESS) 145 MCG CAPS capsule Take 1 capsule (145 mcg total) by mouth daily. (Patient taking differently: Take 145 mcg by mouth as needed (For IBS). ) 90 capsule 3  . NON FORMULARY tumeric     No current facility-administered medications for this visit.    Allergies as of 06/30/2015 - Review Complete 06/30/2015  Allergen Reaction Noted  . Metoprolol Nausea And Vomiting 05/27/2013  . Morphine and related Other (See Comments) 02/14/2012  . Naproxen Other (See Comments) 02/14/2012  . Tramadol Nausea And Vomiting 09/15/2013  . Ibuprofen Rash 02/14/2012  . Penicillins Rash 02/14/2012    ROS:  General: Negative for anorexia, weight loss, fever, chills, fatigue, weakness. ENT: Negative for hoarseness, difficulty swallowing , nasal congestion. CV: Negative for chest pain, angina, palpitations, dyspnea on exertion, peripheral edema.  Respiratory: Negative for dyspnea at rest, dyspnea on exertion, cough, sputum, wheezing.  GI: See history of present illness. GU:  Negative for dysuria, hematuria, urinary incontinence, urinary frequency, nocturnal urination.  Endo: Negative for unusual weight change.    Physical Examination:   BP 162/87 mmHg  Pulse 60  Temp(Src) 97 F (36.1 C) (Oral)  Ht 5' (1.524 m)  Wt 182 lb 3.2 oz (82.645 kg)  BMI 35.58 kg/m2  LMP 12/26/2011  General: Well-nourished, well-developed in no acute distress.  Eyes: No icterus. Mouth: Oropharyngeal mucosa moist and pink , no lesions erythema or exudate. Lungs: Clear to auscultation bilaterally.  Heart: Regular rate and rhythm, no murmurs  rubs or gallops.  Abdomen: Bowel sounds are normal, mild lower abd tenderness, nondistended, no hepatosplenomegaly or masses, no abdominal bruits or hernia , no rebound or guarding.   Extremities: No lower extremity edema. No clubbing or deformities. Neuro: Alert and oriented x 4   Skin: Warm and dry, no  jaundice.   Psych: Alert and cooperative, normal mood and affect.  Labs:  Lab Results  Component Value Date   ALT 35* 03/17/2015   AST 22 03/17/2015   ALKPHOS 69 03/17/2015   BILITOT 0.2 03/17/2015   Lab Results  Component Value Date   CREATININE 0.69 04/08/2015   BUN 12 04/08/2015   NA 137 04/08/2015   K 3.8 04/08/2015   CL 94* 04/08/2015   CO2 28 04/08/2015   Lab Results  Component Value Date   WBC 4.7 12/15/2014   HGB 13.2 11/18/2014   HCT 40.6 12/15/2014   MCV 89 12/15/2014   PLT 296 12/15/2014    Imaging Studies: No results found.

## 2015-06-30 NOTE — Assessment & Plan Note (Signed)
Newly reported chronic GERD for decades. Start Dexilant 60 mg 30 minutes before breakfast daily. Patient assistance forms provided. She should consider upper endoscopy to rule out Barrett's esophagus at some point. Will discuss at future visit.

## 2015-06-30 NOTE — Assessment & Plan Note (Signed)
Patient is interested in surgical options for hemorrhoids. Consider discussing that time of consultation for possible sigmoid colectomy.

## 2015-06-30 NOTE — [place] Notes (Signed)
Patient needs a follow-up office visit in 6 months

## 2015-06-30 NOTE — Assessment & Plan Note (Signed)
History of recurrent diverticulitis. 2 episodes documented in 2016. Patient reports 4 total episodes at least 3 hospitalizations. Sigmoid colectomy previously recommended, patient was unable to have this done as she is uninsured. [facility], she will let me know if she wants us to pursue consultation.

## 2015-06-30 NOTE — Assessment & Plan Note (Signed)
IBS-C. Watery stools on Linzess 145 g daily therefore she does not take it regularly. Trial of Linzess 72 g daily. Patient assistant forms and samples.

## 2015-07-01 ENCOUNTER — Encounter: Payer: Self-pay | Admitting: Gastroenterology

## 2015-07-01 NOTE — Progress Notes (Signed)
CC'ED TO PCP 

## 2015-07-01 NOTE — [place] Notes (Signed)
APPT MADE AND LETTER SENT  °

## 2015-07-02 ENCOUNTER — Ambulatory Visit: Payer: Self-pay | Admitting: Physical Therapy

## 2015-07-02 DIAGNOSIS — M545 Low back pain, unspecified: Secondary | ICD-10-CM

## 2015-07-02 NOTE — Therapy (Addendum)
[facility] [place], [place], [address] Phone: [phone]   Fax:  [phone]  Physical Therapy Treatment  Patient Details  Name: Lisa [name] MRN: [id number] Date of Birth: April 12, 1960 Referring Provider: Kenn File MD.  Encounter Date: 07/02/2015      PT End of Session - 07/02/15 1211    Visit Number 7   Number of Visits 12   Date for PT Re-Evaluation 07/23/15   PT Start Time 0945   PT Stop Time 1042   PT Time Calculation (min) 57 min   Activity Tolerance Patient tolerated treatment well   Behavior During Therapy [facility] for tasks assessed/performed      Past Medical History  Diagnosis Date  . Diverticulitis   . Arthritis   . Asthma   . Mixed hyperlipidemia   . Essential hypertension, benign   . Tendinitis     Patient reports chronic problem involving her arms and hands  . Chronic back pain     Patient reports lower back disc problems  . Myocardial infarction [facility])     Patient reports related to "steroids" in 1985 ([place])  . High blood pressure   . High cholesterol   . Stroke ([place])   . Degenerative lumbar disc   . Bursitis   . PMB (postmenopausal bleeding) 03/18/2014  . Enlarged uterus 03/18/2014  . Mixed stress and urge urinary incontinence 03/18/2014  . GERD (gastroesophageal reflux disease)   . Allergy   . Anemia   . Seizures [facility])     Past Surgical History  Procedure Laterality Date  . Cesarean section    . Tonsillectomy    . Adnoids    . Colonoscopy N/A 06/04/2014    SLF: 1. moderate diverticulosis in teh sigmoid colon, descending colon, and transverse colon. 2. Two colon polyps removed 3. Large external and small internal hemorroids.     There were no vitals filed for this visit.      Subjective Assessment - 07/02/15 1218    Subjective My pain was a 9/10 yesterday but with medication it's a 3/10 today.   Limitations Sitting;Standing;House hold activities   How long can you sit  comfortably? 10 minutes.   How long can you stand comfortably? 10 minutes.   Pain Score 3    Pain Location Hip   Pain Orientation Right;Left   Pain Descriptors / Indicators Aching;Sharp   Pain Type Chronic pain   Pain Onset More than a month ago   Pain Frequency Constant   Pain Relieving Factors Increased activity.                         [facility] Adult PT Treatment/Exercise - 07/02/15 0001    Lumbar Exercises: Aerobic   Stationary Bike Nustep level 4 x 16 minutes.   Lumbar Exercises: Standing   Other Standing Lumbar Exercises Performed while seated Green XTS with draw-in multiple sets to fatigue bilateral shoulder extension and scapular retraction   Other Standing Lumbar Exercises Rockerboad in parallel bars using hands as needed with draw-in activiation for neuromuscular control x 7 minutes.   Acupuncturist Location --  Lumbar   Pensions consultant Stimulation Parameters 80-150 Hz x 20 minutes.                  PT Short Term Goals - 06/10/15 1322    PT SHORT TERM GOAL #1   Title Ind with  a HEP.   Status Achieved  06/10/15           PT Long Term Goals - 07/02/15 1225    PT LONG TERM GOAL #1   Title Sit 30 minutes with pain not > 4/10.   Time 4   Period Weeks   Status On-going   PT LONG TERM GOAL #2   Title Stand 20 minutes with pain not > 4/10.   Time 4   Period Weeks   Status On-going   PT LONG TERM GOAL #3   Title Perform ADL's with pain not > 4/10.   Time 4   Period Weeks   Status On-going               Plan - 07/02/15 1224    Clinical Impression Statement The patient did very well today and she had a notable increase in activity tolerance today.  Overall, she is pleased with her progress and is reporting better days since beginning physical therapy.   Rehab Potential Good   PT Frequency 2x / week   PT Duration 6 weeks   PT Treatment/Interventions ADLs/Self  Care Home Management;Electrical Stimulation;Moist Heat;Therapeutic exercise;Therapeutic activities;Ultrasound;Patient/family education;Manual techniques   PT Next Visit Plan Progress with core exercises.   Consulted and Agree with Plan of Care Patient      Patient will benefit from skilled therapeutic intervention in order to improve the following deficits and impairments:  Pain, Decreased activity tolerance, Decreased range of motion  Visit Diagnosis: Midline low back pain without sciatica     Problem List Patient Active Problem List   Diagnosis Date Noted  . GERD (gastroesophageal reflux disease) 06/30/2015  . IBS (irritable bowel syndrome) 06/30/2015  . Internal and external hemorrhoids without complication 80/05/4915  . Chronic ischemic heart disease 01/07/2015  . Prediabetes 01/07/2015  . Healthcare maintenance 01/07/2015  . Diverticulitis large intestine 08/25/2014  . Acute colitis 08/02/2014  . Change in bowel habits   . Chronic bilateral lower abdominal pain 05/13/2014  . PMB (postmenopausal bleeding) 03/18/2014  . Enlarged uterus 03/18/2014  . Mixed stress and urge urinary incontinence 03/18/2014  . Asthma, extrinsic 09/15/2013  . Dyspnea 04/18/2013  . Abnormal cardiovascular function study 01/21/2013  . Precordial pain 12/10/2012  . Essential hypertension, benign 12/10/2012  . Mixed hyperlipidemia 12/10/2012   PHYSICAL THERAPY DISCHARGE SUMMARY  Visits from Start of Care: 7.  Current functional level related to goals / functional outcomes: See above.   Remaining deficits: Continued LBP.   Education / Equipment: HEP. Plan: Patient agrees to discharge.  Patient goals were not met. Patient is being discharged due to not returning since the last visit.  ?????     Jamilett Ferrante, [place] MPT 07/02/2015, 12:34 PM  [facility] [address] [place], [place], [address] Phone: [phone]   Fax:  [phone]  Name:  Lisa [name] MRN: [id number] Date of Birth: [birth year]

## 2015-07-05 ENCOUNTER — Encounter: Payer: Self-pay | Admitting: Acute Care

## 2015-07-05 ENCOUNTER — Ambulatory Visit (INDEPENDENT_AMBULATORY_CARE_PROVIDER_SITE_OTHER): Payer: Self-pay | Admitting: Acute Care

## 2015-07-05 VITALS — BP 144/88 | HR 71 | Temp 98.1°F | Ht 60.0 in | Wt 182.0 lb

## 2015-07-05 DIAGNOSIS — J4520 Mild intermittent asthma, uncomplicated: Secondary | ICD-10-CM

## 2015-07-05 NOTE — Progress Notes (Signed)
Subjective:    Patient ID: Lisa [name], female    DOB: 12-21-1960, 55 y.o.   MRN: [id number]  HPI   55 yo former smoker, quit in 1987 hx CAD and prior MI, HTN, carries a hx of asthma made in '94. She did have PFT at that time in [place]. She was seen at [facility] [place] for chest discomfort, cough and dyspnea. Has been evaluated by Dr Diona BrownerMcDowell with TTE and stress testing 10/14. [facility] that were read as restriction, no obstruction.She does have allergies, uses bee pollen for this. Last seen in the office 09/2013. Followed by Dr. Delton CoombesByrum.   07/05/2015  Acute Office Visit: Patient is here today regarding the  carpet in her apartment causing worsening of her asthma, and causing increased need for rescue inhaler use.She has paperwork she is requesting that we complete for removal of the carpet from her apartment and replacement with linoleum flooring which is easier to keep clean. She states she has tried cleaning the carpet, and has continued to have worsening of her asthma on a daily basis. Otherwise, she is using the ventolin HFA as rescue.She uses a number of homeopathic remedies, including bee pollen and Bragg's Vinegar, raw potato starch and has gone to a gluten free all organic diet.She is also using green tea as an anti-inflammatory to help.She has many triggers for her asthma, and currently the carpet in her apartment is problematic.She has no other complaints.She denies fever, chest pain, orthopnea, hemoptysis, leg or calf pain.   Current outpatient prescriptions:  .  albuterol (PROVENTIL HFA;VENTOLIN HFA) 108 (90 BASE) MCG/ACT inhaler, Inhale 2 puffs into the lungs every 6 (six) hours as needed for wheezing., Disp: , Rfl:  .  aspirin EC 81 MG tablet, Take 81 mg by mouth daily., Disp: , Rfl:  .  BEE POLLEN PO, Take by mouth., Disp: , Rfl:  .  Charcoal Activated (ACTIVATED CHARCOAL) POWD, 2 capsules by Does not apply route 3 (three) times daily., Disp:  , Rfl:  .  dexlansoprazole (DEXILANT) 60 MG capsule, Take 1 capsule (60 mg total) by mouth daily., Disp: 20 capsule, Rfl: 0 .  DimenhyDRINATE (EQ MOTION SICKNESS PO), Apply 1 application topically as needed (for nausea/vomiting)., Disp: , Rfl:  .  Garlic 10 MG CAPS, Take by mouth. Eats a raw piece of garlic every morning, Disp: , Rfl:  .  hydrochlorothiazide (HYDRODIURIL) 25 MG tablet, Take 1 tablet (25 mg total) by mouth daily., Disp: 30 tablet, Rfl: 5 .  linaclotide (LINZESS) 72 MCG capsule, Take 1 capsule (72 mcg total) by mouth daily before breakfast., Disp: 20 capsule, Rfl: 0 .  methylcellulose (FIBER THERAPY) oral powder, Take by mouth daily., Disp: , Rfl:  .  NON FORMULARY, tumeric, Disp: , Rfl:  .  NON FORMULARY, [place] [employer] - blends up and puts in foods, Disp: , Rfl:    Past Medical History  Diagnosis Date  . Diverticulitis   . Arthritis   . Asthma   . Mixed hyperlipidemia   . Essential hypertension, benign   . Tendinitis     Patient reports chronic problem involving her arms and hands  . Chronic back pain     Patient reports lower back disc problems  . Myocardial infarction [facility](HCC)     Patient reports related to "steroids" in 1985 ([place])  . High blood pressure   . High cholesterol   . Stroke ([facility])   . Degenerative lumbar disc   .  Bursitis   . PMB (postmenopausal bleeding) 03/18/2014  . Enlarged uterus 03/18/2014  . Mixed stress and urge urinary incontinence 03/18/2014  . GERD (gastroesophageal reflux disease)   . Allergy   . Anemia   . Seizures ([facility])     Allergies  Allergen Reactions  . Metoprolol Nausea And Vomiting    Dizziness Elevated BP  . Morphine And Related Other (See Comments)    Disoriented, vomiting and nausea  . Naproxen Other (See Comments)    Lightheadedness  . Tramadol Nausea And Vomiting  . Ibuprofen Rash  . Penicillins Rash    Review of Systems Constitutional:   No  weight loss, night sweats,  Fevers, chills, fatigue, or  lassitude.  HEENT:   No  headaches,  Difficulty swallowing,  Tooth/dental problems, or  Sore throat,                No sneezing, itching, ear ache, nasal congestion, post nasal drip,   CV:  No chest pain,  Orthopnea, PND, swelling in lower extremities, anasarca, dizziness, palpitations, syncope.   GI  No heartburn, indigestion, abdominal pain, nausea, vomiting, diarrhea, change in bowel habits, loss of appetite, bloody stools.   Resp: + shortness of breath with allergy /asthma triggers.  No excess mucus, no productive cough,  No non-productive cough,  No coughing up of blood.  No change in color of mucus.  + wheezing with asthma triggers.  No chest wall deformity  Skin: no rash or lesions.  GU: no dysuria, change in color of urine, no urgency or frequency.  No flank pain, no hematuria   MS:  No joint pain or swelling.  No decreased range of motion.  No back pain.  Psych:  No change in mood or affect. No depression or anxiety.  No memory loss.        Objective:   Physical Exam   height is 5' (1.524 m) and weight is 182 lb (82.555 kg). Her oral temperature is 98.1 F (36.7 C). Her blood pressure is 144/88 and her pulse is 71. Her oxygen saturation is 95%.    Physical Exam:  General- No distress,  A&Ox3 ENT: No sinus tenderness, TM clear, pale nasal mucosa, no oral exudate,no post nasal drip, no LAN Cardiac: S1, S2, regular rate and rhythm, no murmur Chest: No wheeze/ rales/ dullness; no accessory muscle use, no nasal flaring, no sternal retractions Abd.: Soft Non-tender Ext: No clubbing cyanosis, edema Neuro:  normal strength Skin: No rashes, warm and dry Psych: normal mood and behavior  Bevelyn Ngo, AGACNP-BC [facility] Pulmonary/Critical Care Medicine  07/05/2015     Assessment & Plan:

## 2015-07-05 NOTE — Assessment & Plan Note (Signed)
Asthma trigger of carpet in apartment causing increased need for rescue inhaler. Requesting paperwork for replacement of Carpet with Linoleum flooring. Plan: We have completed the paperwork you need for the reasonable Accomodation/Modification Program. Continue with the routine you have to control your allergies and asthma. Continue using your rescue inhaler as needed.every 6 hours as needed for shortness of breath or wheezing. Follow up with Dr. Delton CoombesByrum in 6 months ( in Oct. 2017). Please contact office for sooner follow up if symptoms do not improve or worsen or seek emergency care

## 2015-07-05 NOTE — Patient Instructions (Addendum)
It is nice to meet you today. We have completed the paperwork you need for the reasonable Accomodation/Modification Program. Continue with the routine you have to control your allergies and asthma. Continue using your rescue inhaler as needed.every 6 hours as needed for shortness of breath or wheezing. Follow up with Dr. Delton CoombesByrum in 6 months ( in Oct. 2017). Please contact office for sooner follow up if symptoms do not improve or worsen or seek emergency care

## 2015-07-09 ENCOUNTER — Encounter: Payer: Self-pay | Admitting: *Deleted

## 2015-07-13 ENCOUNTER — Encounter (HOSPITAL_COMMUNITY): Payer: Self-pay | Admitting: Emergency Medicine

## 2015-07-13 ENCOUNTER — Inpatient Hospital Stay (HOSPITAL_COMMUNITY)
Admission: EM | Admit: 2015-07-13 | Discharge: 2015-07-15 | DRG: 392 | Disposition: A | Discharge status: Home / Self Care | Payer: Self-pay | Attending: Internal Medicine | Admitting: Internal Medicine

## 2015-07-13 ENCOUNTER — Emergency Department (HOSPITAL_COMMUNITY): Payer: Self-pay

## 2015-07-13 DIAGNOSIS — Z880 Allergy status to penicillin: Secondary | ICD-10-CM

## 2015-07-13 DIAGNOSIS — K5732 Diverticulitis of large intestine without perforation or abscess without bleeding: Principal | ICD-10-CM | POA: Diagnosis present

## 2015-07-13 DIAGNOSIS — Z888 Allergy status to other drugs, medicaments and biological substances status: Secondary | ICD-10-CM

## 2015-07-13 DIAGNOSIS — Z885 Allergy status to narcotic agent status: Secondary | ICD-10-CM

## 2015-07-13 DIAGNOSIS — R7303 Prediabetes: Secondary | ICD-10-CM | POA: Diagnosis present

## 2015-07-13 DIAGNOSIS — M545 Low back pain: Secondary | ICD-10-CM | POA: Diagnosis present

## 2015-07-13 DIAGNOSIS — Z87891 Personal history of nicotine dependence: Secondary | ICD-10-CM

## 2015-07-13 DIAGNOSIS — J45909 Unspecified asthma, uncomplicated: Secondary | ICD-10-CM | POA: Diagnosis present

## 2015-07-13 DIAGNOSIS — K5792 Diverticulitis of intestine, part unspecified, without perforation or abscess without bleeding: Secondary | ICD-10-CM | POA: Diagnosis present

## 2015-07-13 DIAGNOSIS — E782 Mixed hyperlipidemia: Secondary | ICD-10-CM | POA: Diagnosis present

## 2015-07-13 DIAGNOSIS — Z79899 Other long term (current) drug therapy: Secondary | ICD-10-CM

## 2015-07-13 DIAGNOSIS — K219 Gastro-esophageal reflux disease without esophagitis: Secondary | ICD-10-CM | POA: Diagnosis present

## 2015-07-13 DIAGNOSIS — Z86010 Personal history of colonic polyps: Secondary | ICD-10-CM

## 2015-07-13 DIAGNOSIS — Z8673 Personal history of transient ischemic attack (TIA), and cerebral infarction without residual deficits: Secondary | ICD-10-CM

## 2015-07-13 DIAGNOSIS — Z7982 Long term (current) use of aspirin: Secondary | ICD-10-CM

## 2015-07-13 DIAGNOSIS — I10 Essential (primary) hypertension: Secondary | ICD-10-CM | POA: Diagnosis present

## 2015-07-13 DIAGNOSIS — G8929 Other chronic pain: Secondary | ICD-10-CM | POA: Diagnosis present

## 2015-07-13 DIAGNOSIS — E876 Hypokalemia: Secondary | ICD-10-CM | POA: Diagnosis present

## 2015-07-13 DIAGNOSIS — I252 Old myocardial infarction: Secondary | ICD-10-CM

## 2015-07-13 LAB — BASIC METABOLIC PANEL
Anion gap: 9 (ref 5–15)
BUN: 13 mg/dL (ref 6–20)
CALCIUM: 9.4 mg/dL (ref 8.9–10.3)
CHLORIDE: 96 mmol/L — AB (ref 101–111)
CO2: 28 mmol/L (ref 22–32)
CREATININE: 0.61 mg/dL (ref 0.44–1.00)
Glucose, Bld: 114 mg/dL — ABNORMAL HIGH (ref 65–99)
Potassium: 3.3 mmol/L — ABNORMAL LOW (ref 3.5–5.1)
SODIUM: 133 mmol/L — AB (ref 135–145)

## 2015-07-13 LAB — URINE MICROSCOPIC-ADD ON: RBC / HPF: NONE SEEN RBC/hpf (ref 0–5)

## 2015-07-13 LAB — URINALYSIS, ROUTINE W REFLEX MICROSCOPIC
BILIRUBIN URINE: NEGATIVE
Glucose, UA: NEGATIVE mg/dL
HGB URINE DIPSTICK: NEGATIVE
KETONES UR: NEGATIVE mg/dL
Nitrite: NEGATIVE
PROTEIN: NEGATIVE mg/dL
Specific Gravity, Urine: 1.015 (ref 1.005–1.030)
pH: 5.5 (ref 5.0–8.0)

## 2015-07-13 LAB — CBC WITH DIFFERENTIAL/PLATELET
BASOS PCT: 0 %
Basophils Absolute: 0 10*3/uL (ref 0.0–0.1)
EOS ABS: 0.1 10*3/uL (ref 0.0–0.7)
Eosinophils Relative: 1 %
HCT: 38.2 % (ref 36.0–46.0)
HEMOGLOBIN: 13.7 g/dL (ref 12.0–15.0)
LYMPHS ABS: 1.7 10*3/uL (ref 0.7–4.0)
Lymphocytes Relative: 14 %
MCH: 30.4 pg (ref 26.0–34.0)
MCHC: 35.9 g/dL (ref 30.0–36.0)
MCV: 84.9 fL (ref 78.0–100.0)
Monocytes Absolute: 0.9 10*3/uL (ref 0.1–1.0)
Monocytes Relative: 7 %
NEUTROS PCT: 78 %
Neutro Abs: 9.6 10*3/uL — ABNORMAL HIGH (ref 1.7–7.7)
Platelets: 288 10*3/uL (ref 150–400)
RBC: 4.5 MIL/uL (ref 3.87–5.11)
RDW: 12.4 % (ref 11.5–15.5)
WBC: 12.2 10*3/uL — AB (ref 4.0–10.5)

## 2015-07-13 LAB — LACTIC ACID, PLASMA: LACTIC ACID, VENOUS: 0.8 mmol/L (ref 0.5–2.0)

## 2015-07-13 MED ORDER — SODIUM CHLORIDE 0.9 % IV BOLUS (SEPSIS)
1000.0000 mL | Freq: Once | INTRAVENOUS | Status: AC
  Administered 2015-07-13: 1000 mL via INTRAVENOUS

## 2015-07-13 MED ORDER — ONDANSETRON HCL 4 MG/2ML IJ SOLN
4.0000 mg | Freq: Four times a day (QID) | INTRAMUSCULAR | Status: DC | PRN
  Administered 2015-07-14: 4 mg via INTRAVENOUS
  Filled 2015-07-13: qty 2

## 2015-07-13 MED ORDER — FENTANYL CITRATE (PF) 100 MCG/2ML IJ SOLN
50.0000 ug | Freq: Once | INTRAMUSCULAR | Status: AC
  Administered 2015-07-13: 50 ug via INTRAVENOUS
  Filled 2015-07-13: qty 2

## 2015-07-13 MED ORDER — FENTANYL CITRATE (PF) 100 MCG/2ML IJ SOLN
25.0000 ug | INTRAMUSCULAR | Status: DC | PRN
  Administered 2015-07-14: 25 ug via INTRAVENOUS
  Filled 2015-07-13: qty 2

## 2015-07-13 MED ORDER — ENOXAPARIN SODIUM 40 MG/0.4ML ~~LOC~~ SOLN
40.0000 mg | SUBCUTANEOUS | Status: DC
  Administered 2015-07-14: 40 mg via SUBCUTANEOUS
  Filled 2015-07-13 (×2): qty 0.4

## 2015-07-13 MED ORDER — METRONIDAZOLE IN NACL 5-0.79 MG/ML-% IV SOLN
500.0000 mg | Freq: Three times a day (TID) | INTRAVENOUS | Status: DC
  Administered 2015-07-14 – 2015-07-15 (×5): 500 mg via INTRAVENOUS
  Filled 2015-07-13 (×4): qty 100

## 2015-07-13 MED ORDER — CIPROFLOXACIN IN D5W 400 MG/200ML IV SOLN
400.0000 mg | Freq: Two times a day (BID) | INTRAVENOUS | Status: DC
  Administered 2015-07-14 (×2): 400 mg via INTRAVENOUS
  Filled 2015-07-13 (×3): qty 200

## 2015-07-13 MED ORDER — KCL IN DEXTROSE-NACL 20-5-0.9 MEQ/L-%-% IV SOLN
INTRAVENOUS | Status: DC
  Administered 2015-07-13 – 2015-07-14 (×2): via INTRAVENOUS
  Filled 2015-07-13 (×2): qty 1000

## 2015-07-13 MED ORDER — IOPAMIDOL (ISOVUE-300) INJECTION 61%
100.0000 mL | Freq: Once | INTRAVENOUS | Status: AC | PRN
  Administered 2015-07-13: 100 mL via INTRAVENOUS

## 2015-07-13 MED ORDER — CIPROFLOXACIN IN D5W 400 MG/200ML IV SOLN
400.0000 mg | Freq: Once | INTRAVENOUS | Status: AC
  Administered 2015-07-13: 400 mg via INTRAVENOUS
  Filled 2015-07-13: qty 200

## 2015-07-13 MED ORDER — DIATRIZOATE MEGLUMINE & SODIUM 66-10 % PO SOLN
ORAL | Status: AC
  Filled 2015-07-13: qty 30

## 2015-07-13 MED ORDER — ONDANSETRON HCL 4 MG PO TABS: 4.0000 mg | ORAL_TABLET | Freq: Four times a day (QID) | ORAL | Status: DC | PRN

## 2015-07-13 MED ORDER — METRONIDAZOLE IN NACL 5-0.79 MG/ML-% IV SOLN
500.0000 mg | Freq: Once | INTRAVENOUS | Status: DC
  Filled 2015-07-13: qty 100

## 2015-07-13 NOTE — H&P (Signed)
History and Physical    Brandy HaleFrances M Lindo WUJ:[id number] DOB: [birth year] DOA: 07/13/2015  Referring MD/NP/PA: Lona KettleAshley Laurel Meyer, PA-C PCP: Kevin FentonSamuel Bradshaw, MD  Outpatient Specialists: Cardiology: Dr. Diona BrownerMcDowell, MD; Gastroenterology: Dr. Darrick PennaFields.  Patient coming from: home   Chief Complaint: Abdominal pain   HPI: Lisa [name] is a 55 y.o. female with medical history significant of HLD,GERD, HTN, chronic back pain, and hx of recurrent diverticulitis presented with complaints of LLQ abdominal pain and associated nausea that onset for two days ago. Severity described as 10/10 and is similar to past episodes of diverticulitis. She also complains of chills. She denies vomiting. Last colonoscopy 2016 by Dr Darrick PennaFields where she had moderate descending diverticulosis and 2 polyps.  Her planned repeat colonsocopy was 4 years from now.  She reports this is her 3-4 episode since diagnosis.  She was told to have partial colectomy for her diverticulosis, but hadn't seen a Careers advisersurgeon.    ED Course: Lab in the ED revealed sodium 133; Potassium 3.3; WBC 12.2. UA unremarkable. CT A/P revealed acute diverticulitis. She has been referred for admission for further treatment of diverticulitis.   Review of Systems: As per HPI otherwise 10 point review of systems negative.   Past Medical History  Diagnosis Date  . Diverticulitis   . Arthritis   . Asthma   . Mixed hyperlipidemia   . Essential hypertension, benign   . Tendinitis     Patient reports chronic problem involving her arms and hands  . Chronic back pain     Patient reports lower back disc problems  . Myocardial infarction [facility](HCC)     Patient reports related to "steroids" in 1985 ([place])  . High blood pressure   . High cholesterol   . Stroke ([facility])   . Degenerative lumbar disc   . Bursitis   . PMB (postmenopausal bleeding) 03/18/2014  . Enlarged uterus 03/18/2014  . Mixed stress and urge urinary incontinence 03/18/2014  . GERD (gastroesophageal reflux disease)     . Allergy   . Anemia   . Seizures [facility](HCC)     Past Surgical History  Procedure Laterality Date  . Cesarean section    . Tonsillectomy    . Adnoids    . Colonoscopy N/A 06/04/2014    SLF: 1. moderate diverticulosis in teh sigmoid colon, descending colon, and transverse colon. 2. Two colon polyps removed 3. Large external and small internal hemorroids.      reports that she quit smoking about 30 years ago. Her smoking use included Cigarettes. She has a 24 pack-year smoking history. She has never used smokeless tobacco. She reports that she does not drink alcohol or use illicit drugs.  Allergies  Allergen Reactions  . Metoprolol Nausea And Vomiting    Dizziness Elevated BP  . Morphine And Related Other (See Comments)    Disoriented, vomiting and nausea  . Naproxen Other (See Comments)    Lightheadedness  . Tramadol Nausea And Vomiting  . Ibuprofen Rash  . Penicillins Rash    Family History  Problem Relation Age of Onset  . Adopted: Yes  . Other Mother     aneursym  . Other Maternal Uncle     stenosis  . Arthritis Maternal Uncle   . Angina Son   . ADD / ADHD Son   . Other Son     food allergies  . Blindness Daughter     from birth; brain damage  . Drug abuse Sister   . Heart attack Paternal Aunt   .  Alcohol abuse Cousin   . Drug abuse Cousin     Prior to Admission medications   Medication Sig Start Date End Date Taking? Authorizing Provider  albuterol (PROVENTIL HFA;VENTOLIN HFA) 108 (90 BASE) MCG/ACT inhaler Inhale 2 puffs into the lungs every 6 (six) hours as needed for wheezing.    Historical Provider, MD  aspirin EC 81 MG tablet Take 81 mg by mouth daily.    Historical Provider, MD  BEE POLLEN PO Take by mouth.    Historical Provider, MD  Charcoal Activated (ACTIVATED CHARCOAL) POWD 2 capsules by Does not apply route 3 (three) times daily.    Historical Provider, MD  dexlansoprazole (DEXILANT) 60 MG capsule Take 1 capsule (60 mg total) by mouth daily. 06/30/15    Tiffany Kocher, PA-C  DimenhyDRINATE (EQ MOTION SICKNESS PO) Apply 1 application topically as needed (for nausea/vomiting).    Historical Provider, MD  Garlic 10 MG CAPS Take by mouth. Eats a raw piece of garlic every morning    Historical Provider, MD  hydrochlorothiazide (HYDRODIURIL) 25 MG tablet Take 1 tablet (25 mg total) by mouth daily. 12/08/14   Elenora Gamma, MD  linaclotide [facility]) 72 MCG capsule Take 1 capsule (72 mcg total) by mouth daily before breakfast. 06/30/15   Tiffany Kocher, PA-C  methylcellulose (FIBER THERAPY) oral powder Take by mouth daily.    Historical Provider, MD  NON FORMULARY tumeric    Historical Provider, MD  NON FORMULARY [employer] - blends up and puts in foods    Historical Provider, MD    Physical Exam: Filed Vitals:   07/13/15 1939  BP: 168/101  Pulse: 91  Temp: 98.6 F (37 C)  TempSrc: Oral  Resp: 18  Height: 5' (1.524 m)  Weight: 83.462 kg (184 lb)  SpO2: 96%      Constitutional: NAD, calm, comfortable Filed Vitals:   07/13/15 1939  BP: 168/101  Pulse: 91  Temp: 98.6 F (37 C)  TempSrc: Oral  Resp: 18  Height: 5' (1.524 m)  Weight: 83.462 kg (184 lb)  SpO2: 96%   Respiratory: clear to auscultation bilaterally, no wheezing, no crackles. Normal respiratory effort. No accessory muscle use.  Cardiovascular: Regular rate and rhythm, no murmurs / rubs / gallops. No extremity edema. 2+ pedal pulses. No carotid bruits.  Abdomen: tender to LLQ no rebound,  no masses palpated. No hepatosplenomegaly. Bowel sounds positive.  Musculoskeletal: no clubbing / cyanosis. No joint deformity upper and lower extremities. Good ROM, no contractures. Normal muscle tone.  Neurologic: CN 2-12 grossly intact. Sensation intact, DTR normal. Strength 5/5 in all 4.  Psychiatric: Normal judgment and insight. Alert and oriented x 3. Normal mood.    Labs on Admission: I have personally reviewed following labs and imaging studies  CBC:  Recent Labs Lab  07/13/15 2030  WBC 12.2*  NEUTROABS 9.6*  HGB 13.7  HCT 38.2  MCV 84.9  PLT 288   Basic Metabolic Panel:  Recent Labs Lab 07/13/15 2030  NA 133*  K 3.3*  CL 96*  CO2 28  GLUCOSE 114*  BUN 13  CREATININE 0.61  CALCIUM 9.4    Urine analysis:    Component Value Date/Time   COLORURINE YELLOW 07/13/2015 2106   APPEARANCEUR CLEAR 07/13/2015 2106   LABSPEC 1.015 07/13/2015 2106   PHURINE 5.5 07/13/2015 2106   GLUCOSEU NEGATIVE 07/13/2015 2106   HGBUR NEGATIVE 07/13/2015 2106   BILIRUBINUR NEGATIVE 07/13/2015 2106   KETONESUR NEGATIVE 07/13/2015 2106   PROTEINUR NEGATIVE  07/13/2015 2106   UROBILINOGEN 0.2 11/18/2014 0520   NITRITE NEGATIVE 07/13/2015 2106   LEUKOCYTESUR TRACE* 07/13/2015 2106    Radiological Exams on Admission: Ct Abdomen Pelvis W Contrast  07/13/2015  CLINICAL DATA:  Right lower quadrant abdomen pain today EXAM: CT ABDOMEN AND PELVIS WITH CONTRAST TECHNIQUE: Multidetector CT imaging of the abdomen and pelvis was performed using the standard protocol following bolus administration of intravenous contrast. CONTRAST:  ISOVUE-300 IOPAMIDOL (ISOVUE-300) INJECTION 61% COMPARISON:  November 18, 2014 FINDINGS: Lower chest:  No acute findings. Hepatobiliary: There is mild diffuse low density of the liver without vessel displacement. The gallbladder is normal. There is a 1 cm low-density in the right lobe liver consistent with a cyst unchanged compared to prior exam. The Pancreas: No mass, inflammatory changes, or other significant abnormality. Spleen: Within normal limits in size and appearance. Adrenals/Urinary Tract: The adrenal glands and kidneys are normal. The bladder is normal. No masses identified. No evidence of hydronephrosis. Stomach/Bowel: There is inflammation surrounding the distal descending and proximal sigmoid colon consistent with acute diverticulitis. There is no small bowel obstruction. The stomach is normal. The appendix is not seen but no  inflammation is noted around cecum. Vascular/Lymphatic: No pathologically enlarged lymph nodes. No evidence of abdominal aortic aneurysm. There is atherosclerosis of the abdominal aorta. Reproductive: No mass or other significant abnormality. Other: Small amount of free fluid is identified in the pelvis. Musculoskeletal:  No suspicious bone lesions identified. IMPRESSION: Distal descending/ proximal sigmoid colon acute diverticulitis. Fatty infiltration of liver.  Liver cyst. Electronically Signed   By: Sherian Rein M.D.   On: 07/13/2015 21:48    EKG: Independently reviewed.   Assessment/Plan Active Problems:   * No active hospital problems. *   1. Acute diverticulitis, large intestine. She has been started on IV cipro and flagyl. Continue IVF, pain management, and antiemetics. She will need outpatient follow up for surgery.  She has mild leukocytosis, yet remains afebrile. Clear liquid diet.  2. Essential HTN, stable. Continue home medications.  3. Mixed HLD.  4. Prediabetes.  5.         Mild hypokalemia, will supplement.   DVT prophylaxis: Lovenox  Code Status: Full Family Communication: Husband bedside.  Disposition Plan: Anticipate discharge in 2-3 days.  Consults called: General surgery  Admission status: Inpatient admission to medical floor.   Houston Siren MD FACP.  [facility] Pager [phone]  If 7PM-7AM, please contact night-coverage [email or web address] Password [email or web address]  07/13/2015, 10:18 PM    By signing my name below, I, Zadie Cleverly, attest that this documentation has been prepared under the direction and in the presence of Houston Siren, MD. Electronically signed: Zadie Cleverly, Scribe. 07/13/2015 10:20am   I, Dr. Houston Siren, personally performed the services described in this documentation. All medical record entries made by the scribe were at my discretion and in my presence. Houston Siren, MD 07/13/2015

## 2015-07-13 NOTE — ED Notes (Signed)
Pt c/o rt lower abd pain with nausea.

## 2015-07-13 NOTE — ED Provider Notes (Signed)
CSN: [id number]     Arrival date & time 07/13/15  1927 History   First MD Initiated Contact with Patient 07/13/15 1942     Chief Complaint  Patient presents with  . Abdominal Pain     (Consider location/radiation/quality/duration/timing/severity/associated sxs/prior Treatment) HPI Comments: Lisa [name] is a 55 y.o. female with a history of diverticulitis reports to ED with complaint of left lower quadrant pain and nausea. Patient reports pain similar to the last time she had diverticulitis. Symptoms started approximately three days ago and have progressively worsened. Pain is constant, 10/10, described as a burning sensation in the LLQ. Patient is barely able to walk secondary to pain. She endorses associated nausea and diarrhea. She has tried homeopathic remedies to include food activated charcoal and cranberry juice with minimal relief. Endorses fever and night sweats. Denies urinary symptoms. Denies blood in stool.    Patient is a 55 y.o. female presenting with abdominal pain. The history is provided by the patient and the spouse.  Abdominal Pain Pain location:  LLQ Pain quality: burning   Pain severity:  Severe Associated symptoms: diarrhea, fever and nausea   Associated symptoms: no chest pain, no chills, no constipation, no cough, no dysuria, no fatigue, no hematuria, no shortness of breath, no sore throat, no vaginal bleeding, no vaginal discharge and no vomiting     Past Medical History  Diagnosis Date  . Diverticulitis   . Arthritis   . Asthma   . Mixed hyperlipidemia   . Essential hypertension, benign   . Tendinitis     Patient reports chronic problem involving her arms and hands  . Chronic back pain     Patient reports lower back disc problems  . Myocardial infarction [facility])     Patient reports related to "steroids" in 1985 ([place])  . High blood pressure   . High cholesterol   . Stroke (HCC)   . Degenerative lumbar disc   . Bursitis   . PMB (postmenopausal  bleeding) 03/18/2014  . Enlarged uterus 03/18/2014  . Mixed stress and urge urinary incontinence 03/18/2014  . GERD (gastroesophageal reflux disease)   . Allergy   . Anemia   . Seizures [facility])    Past Surgical History  Procedure Laterality Date  . Cesarean section    . Tonsillectomy    . Adnoids    . Colonoscopy N/A 06/04/2014    SLF: 1. moderate diverticulosis in teh sigmoid colon, descending colon, and transverse colon. 2. Two colon polyps removed 3. Large external and small internal hemorroids.    Family History  Problem Relation Age of Onset  . Adopted: Yes  . Other Mother     aneursym  . Other Maternal Uncle     stenosis  . Arthritis Maternal Uncle   . Angina Son   . ADD / ADHD Son   . Other Son     food allergies  . Blindness Daughter     from birth; brain damage  . Drug abuse Sister   . Heart attack Paternal Aunt   . Alcohol abuse Cousin   . Drug abuse Cousin    Social History  Substance Use Topics  . Smoking status: Former Smoker -- 2.00 packs/day for 12 years    Types: Cigarettes    Quit date: 03/13/1985  . Smokeless tobacco: Never Used     Comment: Quit x 10 years ago  . Alcohol Use: No   OB History    Gravida Para Term Preterm AB TAB SAB  Ectopic Multiple Living   8 8             Review of Systems  Constitutional: Positive for fever. Negative for chills, diaphoresis and fatigue.  HENT: Negative for sore throat and trouble swallowing.   Eyes: Positive for visual disturbance ( difficulty focusing).  Respiratory: Negative for cough and shortness of breath.   Cardiovascular: Negative for chest pain, palpitations and leg swelling.  Gastrointestinal: Positive for nausea, abdominal pain and diarrhea. Negative for vomiting, constipation and blood in stool.  Genitourinary: Negative for dysuria, frequency, hematuria, flank pain, vaginal bleeding, vaginal discharge, vaginal pain and pelvic pain.  Musculoskeletal: Negative for myalgias and arthralgias.  Skin: Negative  for rash.  Neurological: Positive for headaches. Negative for dizziness, weakness and numbness.      Allergies  Metoprolol; Morphine and related; Naproxen; Tramadol; Ibuprofen; and Penicillins  Home Medications   Prior to Admission medications   Medication Sig Start Date End Date Taking? Authorizing Provider  albuterol (PROVENTIL HFA;VENTOLIN HFA) 108 (90 BASE) MCG/ACT inhaler Inhale 2 puffs into the lungs every 6 (six) hours as needed for wheezing.   Yes Historical Provider, MD  aspirin EC 81 MG tablet Take 81 mg by mouth daily.   Yes Historical Provider, MD  BEE POLLEN PO Take 1 capsule by mouth daily.    Yes Historical Provider, MD  Charcoal Activated (ACTIVATED CHARCOAL) POWD 2 capsules by Does not apply route 3 (three) times daily.   Yes Historical Provider, MD  cholecalciferol (VITAMIN D) 1000 units tablet Take 1,000 Units by mouth daily.   Yes Historical Provider, MD  Cyanocobalamin (B-12 PO) Take 1 tablet by mouth daily.   Yes Historical Provider, MD  dexlansoprazole (DEXILANT) 60 MG capsule Take 1 capsule (60 mg total) by mouth daily. 06/30/15  Yes Tiffany KocherLeslie S Lewis, PA-C  DimenhyDRINATE (EQ MOTION SICKNESS PO) Apply 1 application topically as needed (for nausea/vomiting).   Yes Historical Provider, MD  Garlic 10 MG CAPS Take by mouth. Eats a raw piece of garlic every morning   Yes Historical Provider, MD  hydrochlorothiazide (HYDRODIURIL) 25 MG tablet Take 1 tablet (25 mg total) by mouth daily. 12/08/14  Yes Elenora GammaSamuel L Bradshaw, MD  linaclotide [facility](LINZESS) 72 MCG capsule Take 1 capsule (72 mcg total) by mouth daily before breakfast. 06/30/15  Yes Tiffany KocherLeslie S Lewis, PA-C  methylcellulose (FIBER THERAPY) oral powder Take 1 packet by mouth daily. 1 tsp daily in the morning   Yes Historical Provider, MD  NON FORMULARY tumeric   Yes Historical Provider, MD  NON FORMULARY Take by mouth daily. [place] - blends up and puts in foods   Yes Historical Provider, MD  TURMERIC PO Take 1 capsule by mouth  daily.   Yes Historical Provider, MD   BP 160/83 mmHg  Pulse 83  Temp(Src) 99.2 F (37.3 C) (Oral)  Resp 20  Ht 5' (1.524 m)  Wt 83.462 kg  BMI 35.94 kg/m2  SpO2 96%  LMP 12/26/2011 Physical Exam  Constitutional: She appears well-developed and well-nourished. She appears distressed.  HENT:  Head: Normocephalic and atraumatic.  Mouth/Throat: Oropharynx is clear and moist. No oropharyngeal exudate.  Eyes: Conjunctivae and EOM are normal. Pupils are equal, round, and reactive to light. Right eye exhibits no discharge. Left eye exhibits no discharge. No scleral icterus.  Neck: Normal range of motion. Neck supple.  Cardiovascular: Normal rate, regular rhythm, normal heart sounds and intact distal pulses.   No murmur heard. Pulmonary/Chest: Effort normal and breath sounds normal. No  respiratory distress.  Abdominal: Soft. Bowel sounds are normal. She exhibits no mass. There is tenderness ( LUQ and LLQ with focal peritoneal signs). There is no rebound and no guarding.  Musculoskeletal: Normal range of motion.  Lymphadenopathy:    She has no cervical adenopathy.  Neurological: She is alert. Coordination normal.  Skin: Skin is warm and dry. She is not diaphoretic.  Psychiatric: She has a normal mood and affect.    ED Course  Procedures (including critical care time) Labs Review Labs Reviewed  CBC WITH DIFFERENTIAL/PLATELET - Abnormal; Notable for the following:    WBC 12.2 (*)    Neutro Abs 9.6 (*)    All other components within normal limits  BASIC METABOLIC PANEL - Abnormal; Notable for the following:    Sodium 133 (*)    Potassium 3.3 (*)    Chloride 96 (*)    Glucose, Bld 114 (*)    All other components within normal limits  URINALYSIS, ROUTINE W REFLEX MICROSCOPIC (NOT AT [facility]) - Abnormal; Notable for the following:    Leukocytes, UA TRACE (*)    All other components within normal limits  URINE MICROSCOPIC-ADD ON - Abnormal; Notable for the following:    Squamous  Epithelial / LPF 0-5 (*)    Bacteria, UA RARE (*)    All other components within normal limits  LACTIC ACID, PLASMA  LACTIC ACID, PLASMA    Imaging Review Ct Abdomen Pelvis W Contrast  07/13/2015  CLINICAL DATA:  Right lower quadrant abdomen pain today EXAM: CT ABDOMEN AND PELVIS WITH CONTRAST TECHNIQUE: Multidetector CT imaging of the abdomen and pelvis was performed using the standard protocol following bolus administration of intravenous contrast. CONTRAST:  ISOVUE-300 IOPAMIDOL (ISOVUE-300) INJECTION 61% COMPARISON:  November 18, 2014 FINDINGS: Lower chest:  No acute findings. Hepatobiliary: There is mild diffuse low density of the liver without vessel displacement. The gallbladder is normal. There is a 1 cm low-density in the right lobe liver consistent with a cyst unchanged compared to prior exam. The Pancreas: No mass, inflammatory changes, or other significant abnormality. Spleen: Within normal limits in size and appearance. Adrenals/Urinary Tract: The adrenal glands and kidneys are normal. The bladder is normal. No masses identified. No evidence of hydronephrosis. Stomach/Bowel: There is inflammation surrounding the distal descending and proximal sigmoid colon consistent with acute diverticulitis. There is no small bowel obstruction. The stomach is normal. The appendix is not seen but no inflammation is noted around cecum. Vascular/Lymphatic: No pathologically enlarged lymph nodes. No evidence of abdominal aortic aneurysm. There is atherosclerosis of the abdominal aorta. Reproductive: No mass or other significant abnormality. Other: Small amount of free fluid is identified in the pelvis. Musculoskeletal:  No suspicious bone lesions identified. IMPRESSION: Distal descending/ proximal sigmoid colon acute diverticulitis. Fatty infiltration of liver.  Liver cyst. Electronically Signed   By: Sherian Rein M.D.   On: 07/13/2015 21:48   I have personally reviewed and evaluated these images and lab  results as part of my medical decision-making. Stranding noted in the distal descending colon and sigmoid colon. Evaluation consistent with radiology report.    EKG Interpretation None      MDM   Final diagnoses:  Diverticulitis of intestine without perforation or abscess without bleeding    Patient with a history of diverticulitis reports to ED with LLQ, diarrhea, and nausea. Symptoms are similar to previous episode of diverticulitis. Elevated blood pressure, but vital signs otherwise stable. TTP to LUQ and LLQ with focal peritoneal signs. While  there are many considerations for LLQ pain, work up of patient revealed mildly elevated WBC count at 12.2K, normal lactic acid, and CT positive for acute diverticulitis without abscess or perforation. Patient started on ciprofloxacin and flagyl. Consulted [facility] for admission for further management of diverticulitis. Patient to be admitted to [facility], PA-C 07/13/15 2258  Eber Hong, MD 07/15/15 [phone]

## 2015-07-14 LAB — BASIC METABOLIC PANEL
ANION GAP: 7 (ref 5–15)
BUN: 8 mg/dL (ref 6–20)
CALCIUM: 8.6 mg/dL — AB (ref 8.9–10.3)
CO2: 27 mmol/L (ref 22–32)
CREATININE: 0.61 mg/dL (ref 0.44–1.00)
Chloride: 104 mmol/L (ref 101–111)
GFR calc Af Amer: >60 mL/min (ref >60)
GFR calc non Af Amer: >60 mL/min (ref >60)
GLUCOSE: 126 mg/dL — AB (ref 65–99)
Potassium: 3.2 mmol/L — ABNORMAL LOW (ref 3.5–5.1)
Sodium: 138 mmol/L (ref 135–145)

## 2015-07-14 LAB — CBC
HCT: 34.1 % — ABNORMAL LOW (ref 36.0–46.0)
Hemoglobin: 12 g/dL (ref 12.0–15.0)
MCH: 30.1 pg (ref 26.0–34.0)
MCHC: 35.2 g/dL (ref 30.0–36.0)
MCV: 85.5 fL (ref 78.0–100.0)
Platelets: 259 10*3/uL (ref 150–400)
RBC: 3.99 MIL/uL (ref 3.87–5.11)
RDW: 12.4 % (ref 11.5–15.5)
WBC: 9.4 10*3/uL (ref 4.0–10.5)

## 2015-07-14 LAB — MAGNESIUM: MAGNESIUM: 1.7 mg/dL (ref 1.7–2.4)

## 2015-07-14 MED ORDER — POTASSIUM CHLORIDE 10 MEQ/100ML IV SOLN
10.0000 meq | INTRAVENOUS | Status: AC
  Administered 2015-07-14 (×3): 10 meq via INTRAVENOUS
  Filled 2015-07-14: qty 100

## 2015-07-14 MED ORDER — SODIUM CHLORIDE 0.9 % IV SOLN
INTRAVENOUS | Status: DC
  Administered 2015-07-14 (×2): via INTRAVENOUS

## 2015-07-14 NOTE — Care Management Note (Signed)
Case Management Note  Patient Details  Name: Vonda AntiguaFrances M Glahn MRN: [id number] Date of Birth: 1960/10/15  Subjective/Objective:                  Pt admitted with diverticulitis. Pt is from home with husband. Pt is ind with ADL's at baseline. Pt does not work and is not insured. Pt states she is having difficulty affording medications and has filled paperwork for med assistance at the PCP and specialty MD's offices. Pt state she has been denied for disability and is in the process of appealing. [facility]   Action/Plan: Anticipate pt will need MATCH voucher, will provide pt with [facility] paperwork to fill out and submit. Will cont to follow for DC planning needs.   Expected Discharge Date:                  Expected Discharge Plan:  Home/Self Care  In-House Referral:  Financial Counselor  Discharge planning Services  CM Consult, MATCH Program, Medication Assistance  Post Acute Care Choice:  NA Choice offered to:  NA  DME Arranged:    DME Agency:     HH Arranged:    HH Agency:     Status of Service:  In process, will continue to follow  Medicare Important Message Given:    Date Medicare IM Given:    Medicare IM give by:    Date Additional Medicare IM Given:    Additional Medicare Important Message give by:     If discussed at Long Length of Stay Meetings, dates discussed:    Additional Comments:  Malcolm MetroChildress, Chamari Cutbirth Demske, RN 07/14/2015, 1:01 PM

## 2015-07-14 NOTE — Progress Notes (Signed)
PROGRESS NOTE    Lisa [name]  ZOX:[id number] DOB: [birth year] DOA: 07/13/2015 PCP: Kevin Fenton, MD     Brief Narrative:  55 year old woman admitted on 5/2 with complaints of abdominal pain. Found to have diverticulitis and started on clear liquids with IV antibiotics consisting of Cipro and Flagyl.   Assessment & Plan:   Principal Problem:   Diverticulitis Active Problems:   Essential hypertension, benign   Mixed hyperlipidemia   Diverticulitis large intestine   Prediabetes   Acute diverticulitis -Improved although still with abdominal pain. -Increase diet to full liquids, advance as tolerated. -Continue IV antibiotics today.  Hypertension -Well-controlled  Hypokalemia -Replete IV, check magnesium level   DVT prophylaxis: Lovenox Code Status: Full code Family Communication: Patient only Disposition Plan: To be determined, anticipate another 48 hours in the hospital  Consultants:   None  Procedures:   None  Antimicrobials:   Ciprofloxacin  Flagyl    Subjective: Feels improved, would like diet advanced, still with minimal abdominal pain  Objective: Filed Vitals:   07/13/15 2358 07/14/15 0013 07/14/15 0627 07/14/15 1300  BP: 154/79  132/84 138/70  Pulse: 78  88 67  Temp: 99.2 F (37.3 C)  98.1 F (36.7 C) 98.1 F (36.7 C)  TempSrc: Oral  Oral Oral  Resp: Height:      Weight:  83.553 kg (184 lb 3.2 oz)    SpO2: 100%  100% 98%    Intake/Output Summary (Last 24 hours) at 07/14/15 1504 Last data filed at 07/14/15 1100  Gross per 24 hour  Intake   2200 ml  Output      0 ml  Net   2200 ml   Filed Weights   07/13/15 1939 07/14/15 0013  Weight: 83.462 kg (184 lb) 83.553 kg (184 lb 3.2 oz)    Examination:  General exam: Alert, awake, oriented x 3 Respiratory system: Clear to auscultation. Respiratory effort normal. Cardiovascular system:RRR. No murmurs, rubs, gallops. Gastrointestinal system: Abdomen is nondistended, soft  and Tender to palpation of the left lower quadrant. Normal bowel sounds heard. Central nervous system: Alert and oriented. No focal neurological deficits. Extremities: No C/C/E, +pedal pulses Skin: No rashes, lesions or ulcers Psychiatry: Judgement and insight appear normal. Mood & affect appropriate.     Data Reviewed: I have personally reviewed following labs and imaging studies  CBC:  Recent Labs Lab 07/13/15 2030 07/14/15 0554  WBC 12.2* 9.4  NEUTROABS 9.6*  --   HGB 13.7 12.0  HCT 38.2 34.1*  MCV 84.9 85.5  PLT 288 259   Basic Metabolic Panel:  Recent Labs Lab 07/13/15 2030 07/14/15 0554  NA 133* 138  K 3.3* 3.2*  CL 96* 104  CO2 28 27  GLUCOSE 114* 126*  BUN 13 8  CREATININE 0.61 0.61  CALCIUM 9.4 8.6*   GFR: Estimated Creatinine Clearance: 75.2 mL/min (by C-G formula based on Cr of 0.61). Liver Function Tests: No results for input(s): AST, ALT, ALKPHOS, BILITOT, PROT, ALBUMIN in the last 168 hours. No results for input(s): LIPASE, AMYLASE in the last 168 hours. No results for input(s): AMMONIA in the last 168 hours. Coagulation Profile: No results for input(s): INR, PROTIME in the last 168 hours. Cardiac Enzymes: No results for input(s): CKTOTAL, CKMB, CKMBINDEX, TROPONINI in the last 168 hours. BNP (last 3 results) No results for input(s): PROBNP in the last 8760 hours. HbA1C: No results for input(s): HGBA1C in the last 72 hours. CBG: No results for input(s): GLUCAP  in the last 168 hours. Lipid Profile: No results for input(s): CHOL, HDL, LDLCALC, TRIG, CHOLHDL, LDLDIRECT in the last 72 hours. Thyroid Function Tests: No results for input(s): TSH, T4TOTAL, FREET4, T3FREE, THYROIDAB in the last 72 hours. Anemia Panel: No results for input(s): VITAMINB12, FOLATE, FERRITIN, TIBC, IRON, RETICCTPCT in the last 72 hours. Urine analysis:    Component Value Date/Time   COLORURINE YELLOW 07/13/2015 2106   APPEARANCEUR CLEAR 07/13/2015 2106   LABSPEC  1.015 07/13/2015 2106   PHURINE 5.5 07/13/2015 2106   GLUCOSEU NEGATIVE 07/13/2015 2106   HGBUR NEGATIVE 07/13/2015 2106   BILIRUBINUR NEGATIVE 07/13/2015 2106   KETONESUR NEGATIVE 07/13/2015 2106   PROTEINUR NEGATIVE 07/13/2015 2106   UROBILINOGEN 0.2 11/18/2014 0520   NITRITE NEGATIVE 07/13/2015 2106   LEUKOCYTESUR TRACE* 07/13/2015 2106   Sepsis Labs: @LABRCNTIP (procalcitonin:4,lacticidven:4)  )No results found for this or any previous visit (from the past 240 hour(s)).       Radiology Studies: Ct Abdomen Pelvis W Contrast  07/13/2015  CLINICAL DATA:  Right lower quadrant abdomen pain today EXAM: CT ABDOMEN AND PELVIS WITH CONTRAST TECHNIQUE: Multidetector CT imaging of the abdomen and pelvis was performed using the standard protocol following bolus administration of intravenous contrast. CONTRAST:  100mL ISOVUE-300 IOPAMIDOL (ISOVUE-300) INJECTION 61% COMPARISON:  November 18, 2014 FINDINGS: Lower chest:  No acute findings. Hepatobiliary: There is mild diffuse low density of the liver without vessel displacement. The gallbladder is normal. There is a 1 cm low-density in the right lobe liver consistent with a cyst unchanged compared to prior exam. The Pancreas: No mass, inflammatory changes, or other significant abnormality. Spleen: Within normal limits in size and appearance. Adrenals/Urinary Tract: The adrenal glands and kidneys are normal. The bladder is normal. No masses identified. No evidence of hydronephrosis. Stomach/Bowel: There is inflammation surrounding the distal descending and proximal sigmoid colon consistent with acute diverticulitis. There is no small bowel obstruction. The stomach is normal. The appendix is not seen but no inflammation is noted around cecum. Vascular/Lymphatic: No pathologically enlarged lymph nodes. No evidence of abdominal aortic aneurysm. There is atherosclerosis of the abdominal aorta. Reproductive: No mass or other significant abnormality. Other: Small  amount of free fluid is identified in the pelvis. Musculoskeletal:  No suspicious bone lesions identified. IMPRESSION: Distal descending/ proximal sigmoid colon acute diverticulitis. Fatty infiltration of liver.  Liver cyst. Electronically Signed   By: Sherian ReinWei-Chen  Lin M.D.   On: 07/13/2015 21:48        Scheduled Meds: . ciprofloxacin  400 mg Intravenous Q12H  . enoxaparin (LOVENOX) injection  40 mg Subcutaneous Q24H  . metroNIDAZOLE  500 mg Intravenous Once  . metronidazole  500 mg Intravenous Q8H   Continuous Infusions: . dextrose 5 % and 0.9 % NaCl with KCl 20 mEq/L 125 mL/hr at 07/14/15 1341     LOS: 1 day    Time spent: 25 minutes. Greater than 50% of this time was spent in direct contact with the patient coordinating care.     [name],[name], MD [facility] Pager [phone]  If 7PM-7AM, please contact night-coverage [email or web address] Password [email or web address] 07/14/2015, 3:04 PM

## 2015-07-15 DIAGNOSIS — K5712 Diverticulitis of small intestine without perforation or abscess without bleeding: Secondary | ICD-10-CM

## 2015-07-15 DIAGNOSIS — I10 Essential (primary) hypertension: Secondary | ICD-10-CM

## 2015-07-15 LAB — CBC
HCT: 35 % — ABNORMAL LOW (ref 36.0–46.0)
HEMOGLOBIN: 12 g/dL (ref 12.0–15.0)
MCH: 29.9 pg (ref 26.0–34.0)
MCHC: 34.3 g/dL (ref 30.0–36.0)
MCV: 87.1 fL (ref 78.0–100.0)
PLATELETS: 251 10*3/uL (ref 150–400)
RBC: 4.02 MIL/uL (ref 3.87–5.11)
RDW: 12.6 % (ref 11.5–15.5)
WBC: 6.1 10*3/uL (ref 4.0–10.5)

## 2015-07-15 LAB — BASIC METABOLIC PANEL
ANION GAP: 7 (ref 5–15)
BUN: <5 mg/dL — ABNORMAL LOW (ref 6–20)
CHLORIDE: 107 mmol/L (ref 101–111)
CO2: 25 mmol/L (ref 22–32)
CREATININE: 0.59 mg/dL (ref 0.44–1.00)
Calcium: 9 mg/dL (ref 8.9–10.3)
GFR calc non Af Amer: >60 mL/min (ref >60)
Glucose, Bld: 122 mg/dL — ABNORMAL HIGH (ref 65–99)
Potassium: 3.5 mmol/L (ref 3.5–5.1)
SODIUM: 139 mmol/L (ref 135–145)

## 2015-07-15 MED ORDER — CIPROFLOXACIN HCL 500 MG PO TABS: 500.0000 mg | ORAL_TABLET | Freq: Two times a day (BID) | ORAL | Status: DC

## 2015-07-15 MED ORDER — METRONIDAZOLE 500 MG PO TABS: 500.0000 mg | ORAL_TABLET | Freq: Three times a day (TID) | ORAL | Status: DC

## 2015-07-15 NOTE — Discharge Summary (Signed)
Physician Discharge Summary  Lisa [name] GNF:[id number] DOB: [birth year] DOA: 07/13/2015  PCP: Kevin Fenton, MD  Admit date: 07/13/2015 Discharge date: 07/15/2015  Time spent: 45 minutes  Recommendations for Outpatient Follow-up:  -Will be discharged home today. -Advised to follow-up with primary care provider in 2 weeks.   Discharge Diagnoses:  Principal Problem:   Diverticulitis Active Problems:   Essential hypertension, benign   Mixed hyperlipidemia   Diverticulitis large intestine   Prediabetes   Discharge Condition: Stable and improved  Filed Weights   07/13/15 1939 07/14/15 0013  Weight: 83.462 kg (184 lb) 83.553 kg (184 lb 3.2 oz)    History of present illness:  As per Dr. Conley Rolls on 5/2: Lisa [name] is a 55 y.o. female with medical history significant of HLD,GERD, HTN, chronic back pain, and hx of recurrent diverticulitis presented with complaints of LLQ abdominal pain and associated nausea that onset for two days ago. Severity described as 10/10 and is similar to past episodes of diverticulitis. She also complains of chills. She denies vomiting. Last colonoscopy 2016 by Dr Darrick Penna where she had moderate descending diverticulosis and 2 polyps. Her planned repeat colonsocopy was 4 years from now. She reports this is her 3-4 episode since diagnosis. She was told to have partial colectomy for her diverticulosis, but hadn't seen a Careers adviser.   ED Course: Lab in the ED revealed sodium 133; Potassium 3.3; WBC 12.2. UA unremarkable. CT A/P revealed acute diverticulitis. She has been referred for admission for further treatment of diverticulitis.   Hospital Course:   Acute diverticulitis -Clinically improved, tolerating solid diet. -Transition to oral Cipro and Flagyl to continue for 2 weeks total.  Hypertension -Well-controlled  Hypokalemia -Replaced, magnesium level within normal limits.   Procedures:  None   Consultations:  None  Discharge  Instructions  Discharge Instructions    Diet - low sodium heart healthy    Complete by:  As directed      Increase activity slowly    Complete by:  As directed             Medication List    STOP taking these medications        activated charcoal Powd     BEE POLLEN PO     NON FORMULARY     NON FORMULARY     TURMERIC PO      TAKE these medications        albuterol 108 (90 Base) MCG/ACT inhaler  Commonly known as:  PROVENTIL HFA;VENTOLIN HFA  Inhale 2 puffs into the lungs every 6 (six) hours as needed for wheezing.     aspirin EC 81 MG tablet  Take 81 mg by mouth daily.     B-12 PO  Take 1 tablet by mouth daily.     cholecalciferol 1000 units tablet  Commonly known as:  VITAMIN D  Take 1,000 Units by mouth daily.     ciprofloxacin 500 MG tablet  Commonly known as:  CIPRO  Take 1 tablet (500 mg total) by mouth 2 (two) times daily.     dexlansoprazole 60 MG capsule  Commonly known as:  DEXILANT  Take 1 capsule (60 mg total) by mouth daily.     EQ MOTION SICKNESS PO  Apply 1 application topically as needed (for nausea/vomiting).     FIBER THERAPY oral powder  Generic drug:  methylcellulose  Take 1 packet by mouth daily. 1 tsp daily in the morning     Garlic 10  MG Caps  Take by mouth. Eats a raw piece of garlic every morning     hydrochlorothiazide 25 MG tablet  Commonly known as:  HYDRODIURIL  Take 1 tablet (25 mg total) by mouth daily.     linaclotide 72 MCG capsule  Commonly known as:  LINZESS  Take 1 capsule (72 mcg total) by mouth daily before breakfast.     metroNIDAZOLE 500 MG tablet  Commonly known as:  FLAGYL  Take 1 tablet (500 mg total) by mouth 3 (three) times daily.       Allergies  Allergen Reactions  . Metoprolol Nausea And Vomiting    Dizziness Elevated BP  . Morphine And Related Other (See Comments)    Disoriented, vomiting and nausea  . Naproxen Other (See Comments)    Lightheadedness  . Tramadol Nausea And Vomiting  .  Ibuprofen Rash  . Penicillins Rash       Follow-up Information    Follow up with Kevin FentonSamuel Bradshaw, MD. Schedule an appointment as soon as possible for a visit in 2 weeks.   Specialty:  Family Medicine   Contact information:   [address] [place] [address] [phone]        The results of significant diagnostics from this hospitalization (including imaging, microbiology, ancillary and laboratory) are listed below for reference.    Significant Diagnostic Studies: Ct Abdomen Pelvis W Contrast  07/13/2015  CLINICAL DATA:  Right lower quadrant abdomen pain today EXAM: CT ABDOMEN AND PELVIS WITH CONTRAST TECHNIQUE: Multidetector CT imaging of the abdomen and pelvis was performed using the standard protocol following bolus administration of intravenous contrast. CONTRAST:  100mL ISOVUE-300 IOPAMIDOL (ISOVUE-300) INJECTION 61% COMPARISON:  November 18, 2014 FINDINGS: Lower chest:  No acute findings. Hepatobiliary: There is mild diffuse low density of the liver without vessel displacement. The gallbladder is normal. There is a 1 cm low-density in the right lobe liver consistent with a cyst unchanged compared to prior exam. The Pancreas: No mass, inflammatory changes, or other significant abnormality. Spleen: Within normal limits in size and appearance. Adrenals/Urinary Tract: The adrenal glands and kidneys are normal. The bladder is normal. No masses identified. No evidence of hydronephrosis. Stomach/Bowel: There is inflammation surrounding the distal descending and proximal sigmoid colon consistent with acute diverticulitis. There is no small bowel obstruction. The stomach is normal. The appendix is not seen but no inflammation is noted around cecum. Vascular/Lymphatic: No pathologically enlarged lymph nodes. No evidence of abdominal aortic aneurysm. There is atherosclerosis of the abdominal aorta. Reproductive: No mass or other significant abnormality. Other: Small amount of free fluid is  identified in the pelvis. Musculoskeletal:  No suspicious bone lesions identified. IMPRESSION: Distal descending/ proximal sigmoid colon acute diverticulitis. Fatty infiltration of liver.  Liver cyst. Electronically Signed   By: Sherian ReinWei-Chen  Lin M.D.   On: 07/13/2015 21:48    Microbiology: No results found for this or any previous visit (from the past 240 hour(s)).   Labs: Basic Metabolic Panel:  Recent Labs Lab 07/13/15 2030 07/14/15 0554 07/14/15 1805 07/15/15 0600  NA 133* 138  --  139  K 3.3* 3.2*  --  3.5  CL 96* 104  --  107  CO2 28 27  --  25  GLUCOSE 114* 126*  --  122*  BUN 13 8  --  <5*  CREATININE 0.61 0.61  --  0.59  CALCIUM 9.4 8.6*  --  9.0  MG  --   --  1.7  --  Liver Function Tests: No results for input(s): AST, ALT, ALKPHOS, BILITOT, PROT, ALBUMIN in the last 168 hours. No results for input(s): LIPASE, AMYLASE in the last 168 hours. No results for input(s): AMMONIA in the last 168 hours. CBC:  Recent Labs Lab 07/13/15 2030 07/14/15 0554 07/15/15 0600  WBC 12.2* 9.4 6.1  NEUTROABS 9.6*  --   --   HGB 13.7 12.0 12.0  HCT 38.2 34.1* 35.0*  MCV 84.9 85.5 87.1  PLT 288 259 251   Cardiac Enzymes: No results for input(s): CKTOTAL, CKMB, CKMBINDEX, TROPONINI in the last 168 hours. BNP: BNP (last 3 results) No results for input(s): BNP in the last 8760 hours.  ProBNP (last 3 results) No results for input(s): PROBNP in the last 8760 hours.  CBG: No results for input(s): GLUCAP in the last 168 hours.     SignedChaya Jan  [facility] Pager: [phone] 07/15/2015, 3:02 PM

## 2015-07-15 NOTE — Care Management Note (Signed)
Case Management Note  Patient Details  Name: Lisa [name] MRN: [id number] Date of Birth: 1960-05-25  Expected Discharge Date:    07/15/2015              Expected Discharge Plan:  Home/Self Care  In-House Referral:  Financial Counselor  Discharge planning Services  CM Consult, MATCH Program, Medication Assistance  Post Acute Care Choice:  NA Choice offered to:  NA  DME Arranged:    DME Agency:     HH Arranged:    HH Agency:     Status of Service:  Completed, signed off  Medicare Important Message Given:    Date Medicare IM Given:    Medicare IM give by:    Date Additional Medicare IM Given:    Additional Medicare Important Message give by:     If discussed at Long Length of Stay Meetings, dates discussed:    Additional Comments: Pt discharging home today with self care. Pt is connected with FC to assist with disabilty. Pt prescribed 2 abx at DC. Goodrx given for flagyl and cipro is on $4 list. No further med assistance available.   Malcolm Metrohildress, Mable Dara Demske, RN 07/15/2015, 2:13 PM

## 2015-07-19 ENCOUNTER — Encounter (INDEPENDENT_AMBULATORY_CARE_PROVIDER_SITE_OTHER): Payer: Self-pay

## 2015-07-19 ENCOUNTER — Ambulatory Visit (INDEPENDENT_AMBULATORY_CARE_PROVIDER_SITE_OTHER): Payer: Self-pay | Admitting: Family Medicine

## 2015-07-19 ENCOUNTER — Ambulatory Visit (INDEPENDENT_AMBULATORY_CARE_PROVIDER_SITE_OTHER): Payer: Self-pay

## 2015-07-19 ENCOUNTER — Encounter: Payer: Self-pay | Admitting: Family Medicine

## 2015-07-19 VITALS — BP 155/98 | HR 58 | Temp 96.7°F | Ht 61.52 in | Wt 182.0 lb

## 2015-07-19 DIAGNOSIS — G8929 Other chronic pain: Secondary | ICD-10-CM

## 2015-07-19 DIAGNOSIS — M5441 Lumbago with sciatica, right side: Secondary | ICD-10-CM

## 2015-07-19 DIAGNOSIS — K5712 Diverticulitis of small intestine without perforation or abscess without bleeding: Secondary | ICD-10-CM

## 2015-07-19 DIAGNOSIS — M545 Other chronic pain: Secondary | ICD-10-CM

## 2015-07-19 NOTE — Addendum Note (Signed)
Addended by: Elenora GammaBRADSHAW, Lexine Jaspers L on: 07/19/2015 05:08 PM   Modules accepted: Orders, SmartSet

## 2015-07-19 NOTE — Progress Notes (Addendum)
HPI  Patient presents today here for hospital follow-up and discussion of hypertension, and back pain.  Patient has long history of right-sided hip and right-sided low back pain. She was previously seen in [place] by an orthopedic surgeon who stated that she needed to be on disability for the pain. She's very frustrated as she cannot seem to get disability accomplished [place] She would like referral to a surgeon to have further evaluation. She would also like an MRI. She describes right-sided dull lumbar back pain with right-sided posterior leg radiation No clear aggravating or alleviating factors.  Hospital follow-up for diverticulitis Abdominal pain is improving, continuing Cipro and Flagyl with no problems. Tolerating food and fluids by mouth easily. Still in a pure diet.  Hypertension Blood pressure at home averages 1:30 to 155 over 90s frequently She has good compliance with HCTZ No chest pain, palpitations, leg edema, or dyspnea today.    PMH: Smoking status noted ROS: Per HPI  Objective: BP 155/98 mmHg  Pulse 58  Temp(Src) 96.7 F (35.9 C) (Oral)  Ht 5' 1.52" (1.563 m)  Wt 182 lb (82.555 kg)  BMI 33.79 kg/m2  LMP 12/26/2011 Gen: NAD, alert, cooperative with exam HEENT: NCAT CV: RRR, good S1/S2, no murmur Resp: CTABL, no wheezes, non-labored Abd: Soft, tenderness to palpation of right lower quadrant and suprapubic area, per patient improved since last exam Ext: No edema, warm Neuro: Alert and oriented, No gross deficits  Musculoskeletal Tenderness to palpation of right-sided paraspinal muscles and midline lumbar spine Positive straight leg raise on the right Slow gait with right-sided cane use  Plain film lumbar spine Mild disc space narrowing at L3-L4 and L4-L5  Assessment and plan:  # Diverticulitis Improving, continue Cipro and Flagyl Discussed supportive care and reasons to return sooner than planned  # Hypertension Probably would  benefit from additional hypertension medication, however she is very hesitant to do this. Continue HCTZ Consider adding Norvasc  # Chronic back pain, right-sided low back pain with sciatica Long-standing Seems to be limiting her function and white cell Plan to refer to orthopedics after x-ray, will also pursue MRI in the meantime     Orders Placed This Encounter  Procedures  . DG Lumbar Spine 2-3 Views    Standing Status: Future     Number of Occurrences: 1     Standing Expiration Date: 09/17/2016    Order Specific Question:  Reason for Exam (SYMPTOM  OR DIAGNOSIS REQUIRED)    Answer:  back pain, R sided sciatica    Order Specific Question:  Is the patient pregnant?    Answer:  No    Order Specific Question:  Preferred imaging location?    Answer:  External  . MR Lumbar Spine Wo Contrast    Standing Status: Future     Number of Occurrences:      Standing Expiration Date: 09/17/2016    Order Specific Question:  Reason for Exam (SYMPTOM  OR DIAGNOSIS REQUIRED)    Answer:  R sided low back pain with sciatica    Order Specific Question:  Preferred imaging location?    Answer:  [facility] (table limit-350lbs)    Order Specific Question:  What is the patient's sedation requirement?    Answer:  No Sedation    Order Specific Question:  Does the patient have a pacemaker or implanted devices?    Answer:  No  . Ambulatory referral to Orthopedic Surgery    Referral Priority:  Routine  Referral Type:  Surgical    Referral Reason:  Specialty Services Required    Requested Specialty:  Orthopedic Surgery    Number of Visits Requested:  1    Murtis SinkSam Xylia Scherger, MD [facility] 07/19/2015, 9:34 AM

## 2015-07-19 NOTE — Patient Instructions (Signed)
Great to see you!  We will work on a referral to orthopedics for your back  Continue your antibiotics  Lets see you again in 3- 4 months for blood pressure

## 2015-08-11 ENCOUNTER — Telehealth: Payer: Self-pay | Admitting: Family Medicine

## 2015-08-11 NOTE — Telephone Encounter (Signed)
Reviewed xray results from May 8 and mailed a copy to patient.

## 2015-08-13 ENCOUNTER — Telehealth: Payer: Self-pay | Admitting: Family Medicine

## 2015-08-13 NOTE — Telephone Encounter (Signed)
Patient states she needs a letter from you documenting all of her medical diagnoses, that she is seeing Dr. [facility], [facility] and also a [place].  She states she is trying to obtain insurance through [facility] [employer] and needs this letter for that.

## 2015-08-16 ENCOUNTER — Telehealth: Payer: Self-pay | Admitting: Family Medicine

## 2015-08-16 ENCOUNTER — Ambulatory Visit: Payer: Self-pay | Admitting: Family Medicine

## 2015-08-16 NOTE — Telephone Encounter (Signed)
Will write short general letter listing problem list.   Lisa SinkSam Vonceil Upshur, MD [facility] 08/16/2015, 11:55 AM

## 2015-08-16 NOTE — Telephone Encounter (Signed)
Patient aware that letter is ready for pick up.

## 2015-08-16 NOTE — Telephone Encounter (Signed)
She will need to fill out ROI form.   Lisa SinkSam Rebeccah Ivins, MD [facility] 08/16/2015, 4:13 PM

## 2015-08-23 ENCOUNTER — Telehealth: Payer: Self-pay | Admitting: Gastroenterology

## 2015-08-23 ENCOUNTER — Encounter: Payer: Self-pay | Admitting: Gastroenterology

## 2015-08-23 MED ORDER — DEXLANSOPRAZOLE 60 MG PO CPDR: 60.0000 mg | DELAYED_RELEASE_CAPSULE | Freq: Every day | ORAL | Status: DC

## 2015-08-23 NOTE — Telephone Encounter (Signed)
Spoke with the pt. She said she is already getting linzess from the health dept. Pt said she needs an rx for dexilant sent to Bl[facility]ja [facility]. Please print rx and I will fax it to her.

## 2015-08-23 NOTE — Telephone Encounter (Signed)
[phone]  PLEASE CALL PATIENT REGARDING MEDICATIONS.  NEEDS THEM TO BE FAXED TO HEALTH DEPARTMENT BECAUSE THEY HELP HER GET HER MEDS.

## 2015-08-23 NOTE — Addendum Note (Signed)
Addended by: Delane GingerGILL, Tanetta Fuhriman A on: 08/23/2015 01:13 PM   Modules accepted: Orders

## 2015-08-23 NOTE — Telephone Encounter (Signed)
Completed, signed, and ready to send.

## 2015-08-23 NOTE — Telephone Encounter (Signed)
I called Sonja at the health dept. She works [employer]. I left her a voicemail and I have faxed the rx to her at the health dept.

## 2015-08-25 ENCOUNTER — Ambulatory Visit (INDEPENDENT_AMBULATORY_CARE_PROVIDER_SITE_OTHER): Payer: Self-pay | Admitting: Orthopaedic Surgery

## 2015-08-25 ENCOUNTER — Encounter: Payer: Self-pay | Admitting: Orthopaedic Surgery

## 2015-08-25 ENCOUNTER — Ambulatory Visit (INDEPENDENT_AMBULATORY_CARE_PROVIDER_SITE_OTHER): Payer: Self-pay

## 2015-08-25 VITALS — BP 172/94 | HR 64 | Temp 97.9°F | Ht 60.0 in | Wt 181.8 lb

## 2015-08-25 DIAGNOSIS — I10 Essential (primary) hypertension: Secondary | ICD-10-CM

## 2015-08-25 DIAGNOSIS — M5441 Lumbago with sciatica, right side: Secondary | ICD-10-CM

## 2015-08-25 DIAGNOSIS — K589 Irritable bowel syndrome without diarrhea: Secondary | ICD-10-CM

## 2015-08-25 DIAGNOSIS — I259 Chronic ischemic heart disease, unspecified: Secondary | ICD-10-CM

## 2015-08-25 NOTE — Progress Notes (Signed)
Subjective:  My back hurts.  It has been hurting since 1985.    Patient ID: Lisa [name], female    DOB: [birth year], 55 y.o.   MRN: [id number]  Back Pain This is a chronic problem. The current episode started more than 1 year ago. The problem occurs daily. The problem has been waxing and waning since onset. The pain is present in the lumbar spine. The quality of the pain is described as aching. The pain is at a severity of 4/10. The pain is moderate. The pain is the same all the time. The symptoms are aggravated by bending, sitting, stress and twisting. Associated symptoms include chest pain. She has tried chiropractic manipulation, heat, home exercises, analgesics, bed rest, ice, NSAIDs and walking for the symptoms. The treatment provided mild relief.   She has had lower back pain since her child was born in 191986.  It waxes and wanes.  She got better by 1990.  Then in early 2000 she started having more and more pain.  [facility] for treatments.  She was placed out of work.  She has had right hip pain and right lower back pain and right sciatica.  It is getting worse. She was treated in [place] and was making some progress.  She moved to this area in 2013.  She has not seen an orthopaedist until today. [facility].  She had a MRI ordered in May but was unable to go. She has sharp, dull pain that is throbbing, knife-like, aching, prickly, burning and shooting. It is deep and constant.  The pain is worse in the morning and at night.  Any standing or bending hurts her back.  She uses a walker.  She has no weakness or bowel or bladder problem.  She has chronic ischemic heart disease which also limits her activities.  She has irritable bowel syndrome and is limited in NSAID use.    She has hypertension controlled with medicine and diet.  She does exercises she learned in [place].  She has no new trauma.   Review  of Systems  HENT: Negative for congestion.   Respiratory: Positive for shortness of breath. Negative for cough.   Cardiovascular: Positive for chest pain. Negative for leg swelling.  Endocrine: Positive for cold intolerance.  Musculoskeletal: Positive for back pain and arthralgias.  Allergic/Immunologic: Positive for environmental allergies.  Psychiatric/Behavioral: The patient is nervous/anxious.    Past Medical History  Diagnosis Date  . Diverticulitis   . Arthritis   . Asthma   . Mixed hyperlipidemia   . Essential hypertension, benign   . Tendinitis     Patient reports chronic problem involving her arms and hands  . Chronic back pain     Patient reports lower back disc problems  . Myocardial infarction [facility](HCC)     Patient reports related [place] "[place]" in [place] ([place]  . High blood pressure   . High cholesterol   . Stroke (HCC)   . Degenerative lumbar disc   . Bursitis   . PMB (postmenopausal bleeding) 03/18/2014  . Enlarged uterus 03/18/2014  . Mixed stress and urge urinary incontinence 03/18/2014  . GERD (gastroesophageal reflux disease)   . Allergy   . Anemia   . Seizures [facility](HCC)     Past Surgical History  Procedure Laterality Date  . Cesarean section    . Tonsillectomy    . Adnoids    . Colonoscopy N/A  06/04/2014    SLF: 1. moderate diverticulosis in teh sigmoid colon, descending colon, and transverse colon. 2. Two colon polyps removed 3. Large external and small internal hemorroids.     Current Outpatient Prescriptions on File Prior to Visit  Medication Sig Dispense Refill  . albuterol (PROVENTIL HFA;VENTOLIN HFA) 108 (90 BASE) MCG/ACT inhaler Inhale 2 puffs into the lungs every 6 (six) hours as needed for wheezing.    Marland Kitchen aspirin EC 81 MG tablet Take 81 mg by mouth daily.    . cholecalciferol (VITAMIN D) 1000 units tablet Take 1,000 Units by mouth daily.    . Cyanocobalamin (B-12 PO) Take 1 tablet by mouth daily.    Marland Kitchen dexlansoprazole (DEXILANT) 60 MG capsule Take 1  capsule (60 mg total) by mouth daily. 20 capsule 0  . Garlic 10 MG CAPS Take by mouth. Eats a raw piece of garlic every morning    . hydrochlorothiazide (HYDRODIURIL) 25 MG tablet Take 1 tablet (25 mg total) by mouth daily. 30 tablet 5  . linaclotide (LINZESS) 72 MCG capsule Take 1 capsule (72 mcg total) by mouth daily before breakfast. 20 capsule 0  . methylcellulose (FIBER THERAPY) oral powder Take 1 packet by mouth daily. 1 tsp daily in the morning    . DimenhyDRINATE (EQ MOTION SICKNESS PO) Apply 1 application topically as needed (for nausea/vomiting). Reported on 08/25/2015    . metroNIDAZOLE (FLAGYL) 500 MG tablet Take 1 tablet (500 mg total) by mouth 3 (three) times daily. (Patient not taking: Reported on 08/25/2015) 42 tablet 0   No current facility-administered medications on file prior to visit.    Social History   Social History  . Marital Status: Married    Spouse Name: N/A  . Number of Children: N/A  . Years of Education: N/A   Occupational History  . Unemployed    Social History Main Topics  . Smoking status: Former Smoker -- 2.00 packs/day for 12 years    Types: Cigarettes    Quit date: 03/13/1985  . Smokeless tobacco: Never Used     Comment: Quit x 10 years ago  . Alcohol Use: No  . Drug Use: No  . Sexual Activity: No   Other Topics Concern  . Not on file   Social History Narrative   GRANDFATHER DIED IN 21-Jul-1967 POSSIBLY DUE TO DIVERTICULITIS. [employer]ANK [employer] FOR [employer]. LIVING IN [place].     BP 172/94 mmHg  Pulse 64  Temp(Src) 97.9 F (36.6 C)  Ht 5' (1.524 m)  Wt 181 lb 12.8 oz (82.464 kg)  BMI 35.51 kg/m2  LMP 12/26/2011     Objective:   Physical Exam  Constitutional: She is oriented to person, place, and time. She appears well-developed and well-nourished.  HENT:  Head: Normocephalic and atraumatic.  Eyes: Conjunctivae and EOM are normal. Pupils are equal, round, and reactive to light.  Neck: Normal range of motion. Neck supple.   Cardiovascular: Normal rate, regular rhythm and intact distal pulses.   Pulmonary/Chest: Effort normal.  Abdominal: Soft.  Musculoskeletal: She exhibits tenderness (Her lower back is very tender, flexion is 15, extension is 0, lateral bend normal bilaterally, SLR 35 on the right, uses walker, no spasm of back.).  Neurological: She is alert and oriented to person, place, and time. She displays normal reflexes. No cranial nerve deficit. She exhibits normal muscle tone. Coordination normal.  Skin: Skin is warm and dry.  Psychiatric: She has a normal mood and affect. Her behavior is normal. Judgment and thought  content normal.    X-rays of the lumbar spine were done, reported separately.  I showed her and her husband the x-rays.      Assessment & Plan:   Encounter Diagnoses  Name Primary?  . Right-sided low back pain with right-sided sciatica Yes  . Essential hypertension, benign   . Chronic ischemic heart disease   . IBS (irritable bowel syndrome)     I will get a MRI of the lumbar spine.  She is to continue present medicines. I am concerned about a possible HNP.  Call if any problem.  Precautions discussed.

## 2015-08-25 NOTE — Patient Instructions (Signed)
MRI ORDERED. We will contact your insurance company for pre-certification. After we receive that, we will schedule you an appointment for the MRI and contact you. If you have not heard from our office in one week, contact us.     

## 2015-08-31 ENCOUNTER — Telehealth: Payer: Self-pay | Admitting: Orthopaedic Surgery

## 2015-08-31 NOTE — Telephone Encounter (Signed)
Spoke with patient and she will ask her neighbor to take her.

## 2015-08-31 NOTE — Telephone Encounter (Signed)
Patient is schedule to have a MRI [facility], but she has no transportation to get there. She is claustrophobic. Please call and advise.

## 2015-09-01 NOTE — Telephone Encounter (Signed)
Patient has left voice message regarding the scheduled open MRI at [facility]. States her attempts to arrange transportation have failed, and is unable to get to the 09/07/15 appointment there.  States [facility] cannot transport there.  Asking if any other options ? Her ph# is [phone]

## 2015-09-01 NOTE — Telephone Encounter (Signed)
I don't have any other suggestions. She is claustrophobic and has to have an open unit, and [facility].

## 2015-09-01 NOTE — Telephone Encounter (Signed)
Multiple attempts made to contact patient.  This encounter will now be closed  

## 2015-09-02 ENCOUNTER — Telehealth: Payer: Self-pay | Admitting: Orthopaedic Surgery

## 2015-09-02 NOTE — Telephone Encounter (Signed)
Patient called again saying that she can not get a ride [facility]. I explained to her about the open unit and she said that she could do the  closed unit as long as she focuses her mind on something else.

## 2015-09-02 NOTE — Telephone Encounter (Signed)
Spoke with patient and advised that it would be close, but patient states she can do that. I rescheduled the MRI for 09/09/15 at 3:45 [facility].

## 2015-09-03 ENCOUNTER — Other Ambulatory Visit: Payer: Self-pay

## 2015-09-07 ENCOUNTER — Other Ambulatory Visit: Payer: Self-pay

## 2015-09-09 ENCOUNTER — Ambulatory Visit (HOSPITAL_COMMUNITY): Payer: Self-pay

## 2015-09-15 ENCOUNTER — Ambulatory Visit (HOSPITAL_COMMUNITY)
Admission: RE | Admit: 2015-09-15 | Discharge: 2015-09-15 | Disposition: A | Discharge status: Home / Self Care | Payer: Self-pay | Source: Ambulatory Visit | Attending: Orthopaedic Surgery | Admitting: Orthopaedic Surgery

## 2015-09-15 ENCOUNTER — Ambulatory Visit: Payer: Self-pay | Admitting: Orthopaedic Surgery

## 2015-09-15 DIAGNOSIS — M5441 Lumbago with sciatica, right side: Secondary | ICD-10-CM | POA: Insufficient documentation

## 2015-09-15 DIAGNOSIS — M5126 Other intervertebral disc displacement, lumbar region: Secondary | ICD-10-CM | POA: Insufficient documentation

## 2015-09-21 ENCOUNTER — Ambulatory Visit: Payer: Self-pay | Admitting: Orthopaedic Surgery

## 2015-09-28 ENCOUNTER — Ambulatory Visit (INDEPENDENT_AMBULATORY_CARE_PROVIDER_SITE_OTHER): Payer: Self-pay | Admitting: Orthopaedic Surgery

## 2015-09-28 ENCOUNTER — Encounter: Payer: Self-pay | Admitting: Orthopaedic Surgery

## 2015-09-28 VITALS — BP 171/88 | HR 60 | Temp 97.3°F | Ht 60.0 in | Wt 184.2 lb

## 2015-09-28 DIAGNOSIS — M5441 Lumbago with sciatica, right side: Secondary | ICD-10-CM

## 2015-09-28 DIAGNOSIS — I10 Essential (primary) hypertension: Secondary | ICD-10-CM

## 2015-09-28 DIAGNOSIS — K589 Irritable bowel syndrome without diarrhea: Secondary | ICD-10-CM

## 2015-09-28 NOTE — Progress Notes (Signed)
Patient Lisa [name], female DOB:04-08-1960, 55 y.o. YQM:[id number]  Chief Complaint  Patient presents with  . Results    MRI L SPINE    HPI  Lisa [name] is a 55 y.o. female who has chronic lower back pain.  She is better.  She had a MRI done recently and it shows: IMPRESSION: 1. At L3-4 there is a mild broad-based disc bulge. 2. No significant disc protrusion, foraminal stenosis or central canal stenosis of the lumbar spine.  I have explained the findings to her and given her a copy of the report.  She has some right knee pain and I will see her for this in three weeks.  She has no acute problem.  HPI  Body mass index is 35.97 kg/(m^2).  ROS  Review of Systems  HENT: Negative for congestion.   Respiratory: Positive for shortness of breath. Negative for cough.   Cardiovascular: Positive for chest pain. Negative for leg swelling.  Endocrine: Positive for cold intolerance.  Musculoskeletal: Positive for back pain and arthralgias.  Allergic/Immunologic: Positive for environmental allergies.  Psychiatric/Behavioral: The patient is nervous/anxious.     Past Medical History  Diagnosis Date  . Diverticulitis   . Arthritis   . Asthma   . Mixed hyperlipidemia   . Essential hypertension, benign   . Tendinitis     Patient reports chronic problem involving her arms and hands  . Chronic back pain     Patient reports lower back disc problems  . Myocardial infarction [facility])     Patient reports related to "steroids" in 1985 ([place])  . High blood pressure   . High cholesterol   . Stroke (HCC)   . Degenerative lumbar disc   . Bursitis   . PMB (postmenopausal bleeding) 03/18/2014  . Enlarged uterus 03/18/2014  . Mixed stress and urge urinary incontinence 03/18/2014  . GERD (gastroesophageal reflux disease)   . Allergy   . Anemia   . Seizures [facility])     Past Surgical History  Procedure Laterality Date  . Cesarean section    . Tonsillectomy    . Adnoids    . Colonoscopy  N/A 06/04/2014    SLF: 1. moderate diverticulosis in teh sigmoid colon, descending colon, and transverse colon. 2. Two colon polyps removed 3. Large external and small internal hemorroids.     Family History  Problem Relation Age of Onset  . Adopted: Yes  . Other Mother     aneursym  . Other Maternal Uncle     stenosis  . Arthritis Maternal Uncle   . Angina Son   . ADD / ADHD Son   . Other Son     food allergies  . Blindness Daughter     from birth; brain damage  . Drug abuse Sister   . Heart attack Paternal Aunt   . Alcohol abuse Cousin   . Drug abuse Cousin     Social History Social History  Substance Use Topics  . Smoking status: Former Smoker -- 2.00 packs/day for 12 years    Types: Cigarettes    Quit date: 03/13/1985  . Smokeless tobacco: Never Used     Comment: Quit x 10 years ago  . Alcohol Use: No    Allergies  Allergen Reactions  . Metoprolol Nausea And Vomiting    Dizziness Elevated BP  . Morphine And Related Other (See Comments)    Disoriented, vomiting and nausea  . Naproxen Other (See Comments)    Lightheadedness  . Tramadol  Nausea And Vomiting  . Ibuprofen Rash  . Penicillins Rash    Current Outpatient Prescriptions  Medication Sig Dispense Refill  . albuterol (PROVENTIL HFA;VENTOLIN HFA) 108 (90 BASE) MCG/ACT inhaler Inhale 2 puffs into the lungs every 6 (six) hours as needed for wheezing.    Marland Kitchen aspirin EC 81 MG tablet Take 81 mg by mouth daily.    Marland Kitchen atorvastatin (LIPITOR) 40 MG tablet Take 40 mg by mouth daily.    . cholecalciferol (VITAMIN D) 1000 units tablet Take 1,000 Units by mouth daily.    . Cyanocobalamin (B-12 PO) Take 1 tablet by mouth daily.    Marland Kitchen dexlansoprazole (DEXILANT) 60 MG capsule Take 1 capsule (60 mg total) by mouth daily. 20 capsule 0  . DimenhyDRINATE (EQ MOTION SICKNESS PO) Apply 1 application topically as needed (for nausea/vomiting). Reported on 08/25/2015    . Garlic 10 MG CAPS Take by mouth. Eats a raw piece of garlic  every morning    . hydrochlorothiazide (HYDRODIURIL) 25 MG tablet Take 1 tablet (25 mg total) by mouth daily. 30 tablet 5  . linaclotide (LINZESS) 72 MCG capsule Take 1 capsule (72 mcg total) by mouth daily before breakfast. 20 capsule 0  . methylcellulose (FIBER THERAPY) oral powder Take 1 packet by mouth daily. 1 tsp daily in the morning    . metroNIDAZOLE (FLAGYL) 500 MG tablet Take 1 tablet (500 mg total) by mouth 3 (three) times daily. 42 tablet 0   No current facility-administered medications for this visit.     Physical Exam  Blood pressure 171/88, pulse 60, temperature 97.3 F (36.3 C), height 5' (1.524 m), weight 184 lb 3.2 oz (83.553 kg), last menstrual period 12/26/2011.  Constitutional: overall normal hygiene, normal nutrition, well developed, normal grooming, normal body habitus. Assistive device:none  Musculoskeletal: gait and station Limp none, muscle tone and strength are normal, no tremors or atrophy is present.  .  Neurological: coordination overall normal.  Deep tendon reflex/nerve stretch intact.  Sensation normal.  Cranial nerves II-XII intact.   Skin:   normal overall no scars, lesions, ulcers or rashes. No psoriasis.  Psychiatric: Alert and oriented x 3.  Recent memory intact, remote memory unclear.  Normal mood and affect. Well groomed.  Good eye contact.  Cardiovascular: overall no swelling, no varicosities, no edema bilaterally, normal temperatures of the legs and arms, no clubbing, cyanosis and good capillary refill.  Lymphatic: palpation is normal.  Spine/Pelvis examination:  Inspection:  Overall, sacoiliac joint benign and hips nontender; without crepitus or defects.   Thoracic spine inspection: Alignment normal without kyphosis present   Lumbar spine inspection:  Alignment  with normal lumbar lordosis, without scoliosis apparent.   Thoracic spine palpation:  without tenderness of spinal processes   Lumbar spine palpation: with tenderness of lumbar  area; without tightness of lumbar muscles    Range of Motion:   Lumbar flexion, forward flexion is full  without pain or tenderness    Lumbar extension is full  without pain or tenderness   Left lateral bend is Normal  without pain or tenderness   Right lateral bend is Normal without pain or tenderness   Straight leg raising is Normal   Strength & tone: Normal   Stability overall normal stability     The patient has been educated about the nature of the problem(s) and counseled on treatment options.  The patient appeared to understand what I have discussed and is in agreement with it.  Encounter Diagnoses  Name Primary?  Marland Kitchen  Right-sided low back pain with right-sided sciatica Yes  . Essential hypertension, benign   . IBS (irritable bowel syndrome)     PLAN Call if any problems.  Precautions discussed.  Continue current medications.   Return to clinic 3 weeks   Electronically Signed Darreld McleanWayne Sydna Brodowski, MD 7/18/201710:46 AM

## 2015-09-29 ENCOUNTER — Telehealth: Payer: Self-pay | Admitting: Family Medicine

## 2015-09-29 DIAGNOSIS — M25561 Pain in right knee: Secondary | ICD-10-CM

## 2015-10-12 IMAGING — CT CT ABD-PELV W/ CM
2 of 5 series · 17 of 46 positions shown, 19 images · IV contrast (Omnipaque 300)
Comparison: 08/02/2014

CLINICAL DATA: Lower abdominal pain since 1 a.m.. History of
diverticulitis.

EXAM:
CT ABDOMEN AND PELVIS WITH CONTRAST
TECHNIQUE: Multidetector CT imaging of the abdomen and pelvis was performed
using the standard protocol following bolus administration of
intravenous contrast.
CONTRAST:  50mL OMNIPAQUE IOHEXOL 300 MG/ML SOLN, 100mL OMNIPAQUE
IOHEXOL 300 MG/ML SOLN

[Series 2: abd_pel_with 5.0 b40f · axial · 0.77mm/px · z∈[-468,-48]mm · 14 of 96 slices shown, 16 images]
[im 6/96  soft-tissue]
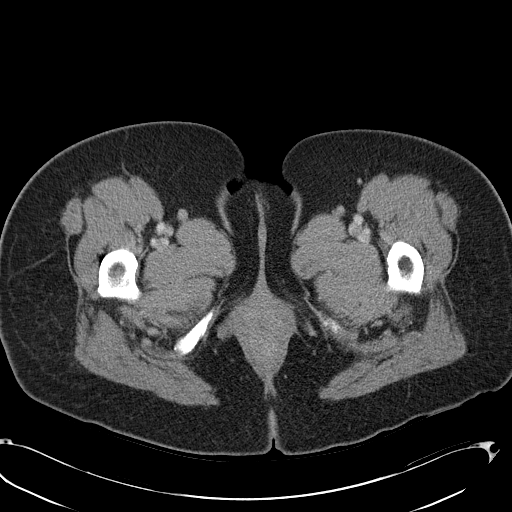
[im 6/96  bone]
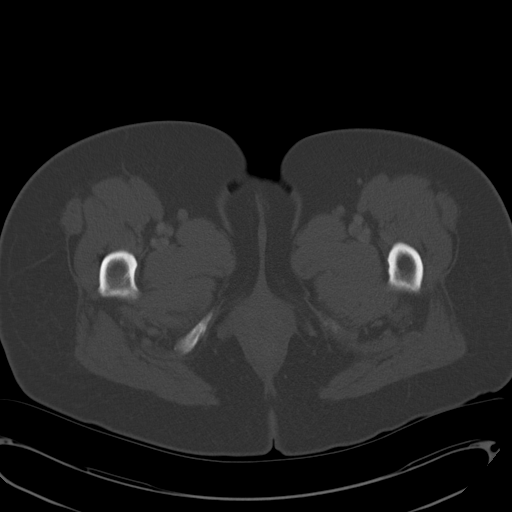
[im 11/96  soft-tissue]
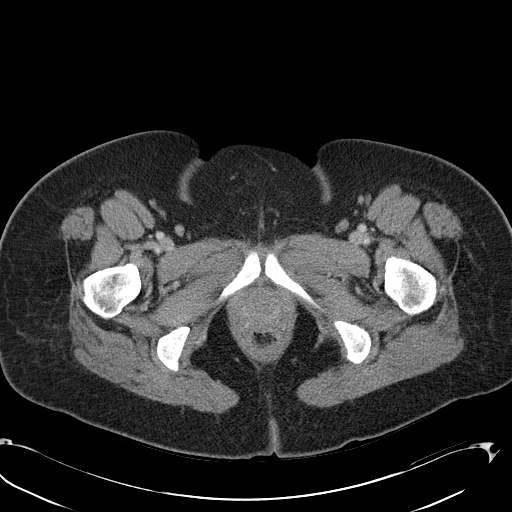
[im 22/96  soft-tissue]
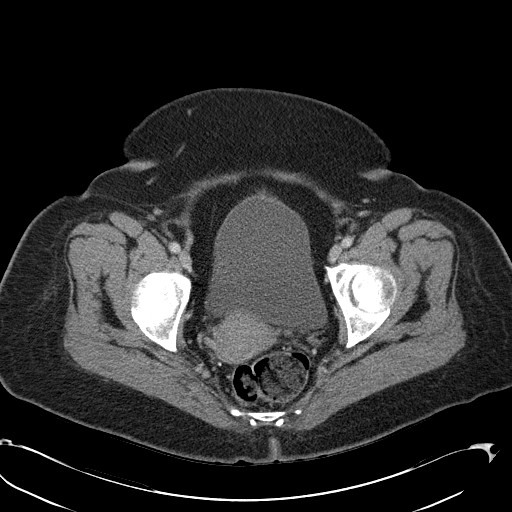
[im 27/96  soft-tissue]
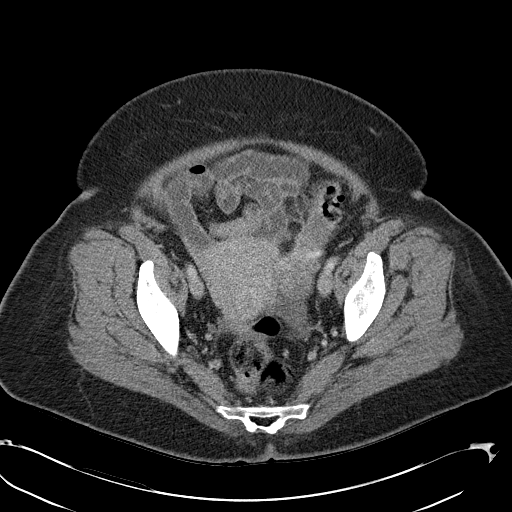
[im 32/96  soft-tissue]
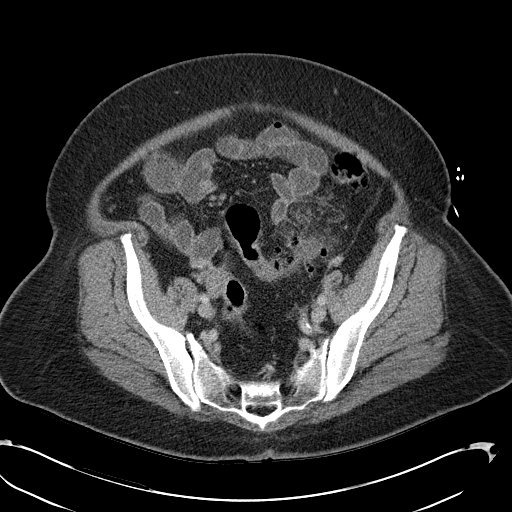
[im 37/96  soft-tissue]
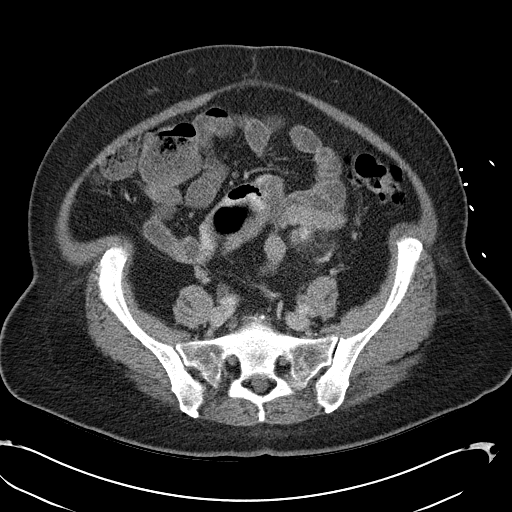
[im 43/96  soft-tissue]
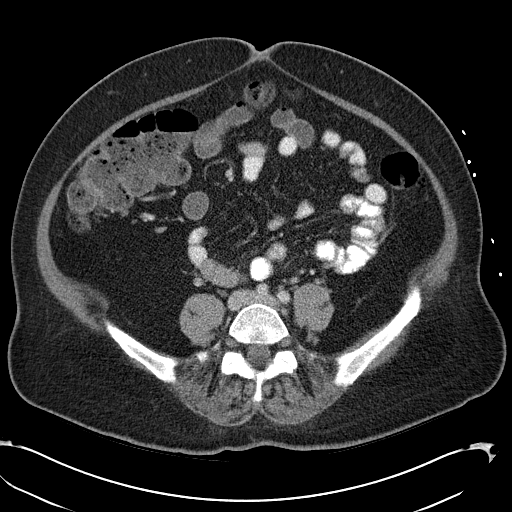
[im 53/96  soft-tissue]
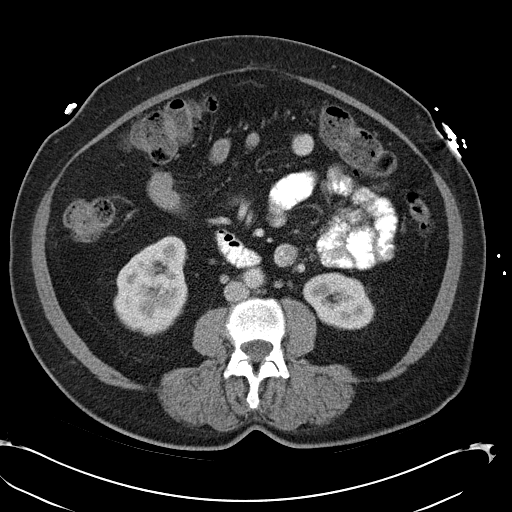
[im 59/96  soft-tissue]
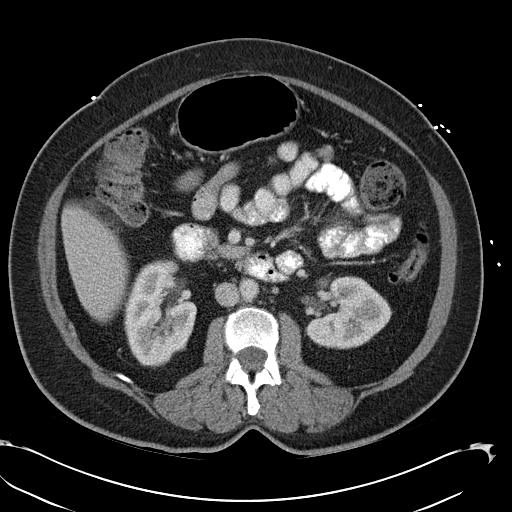
[im 59/96  bone]
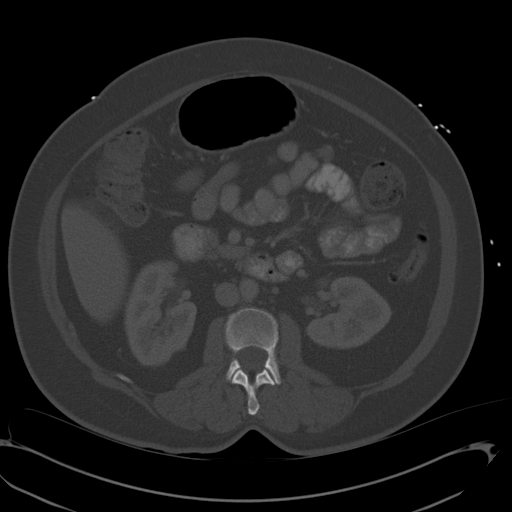
[im 64/96  soft-tissue]
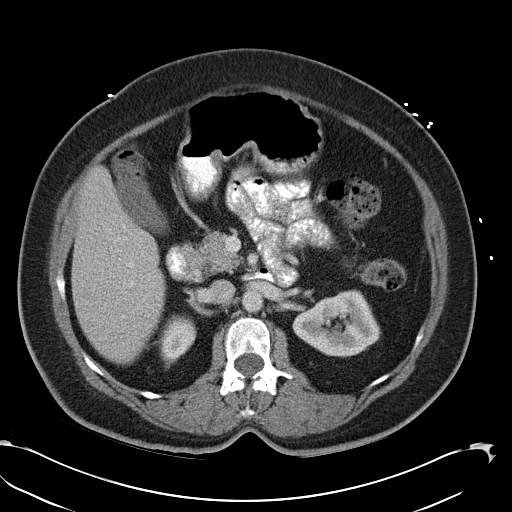
[im 69/96  soft-tissue]
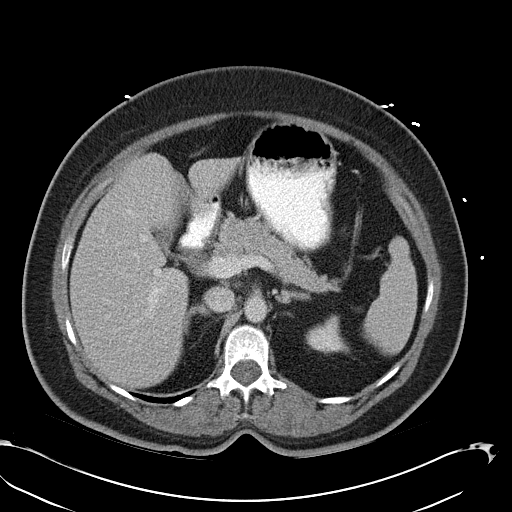
[im 74/96  soft-tissue]
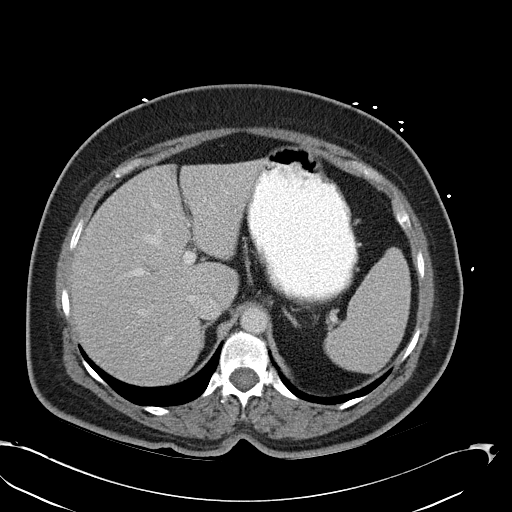
[im 85/96  soft-tissue]
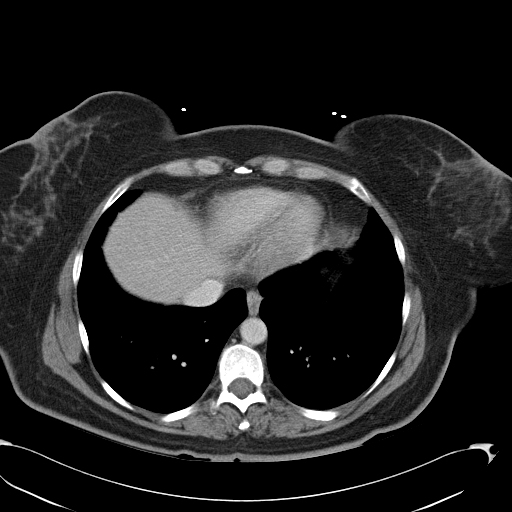
[im 90/96  soft-tissue]
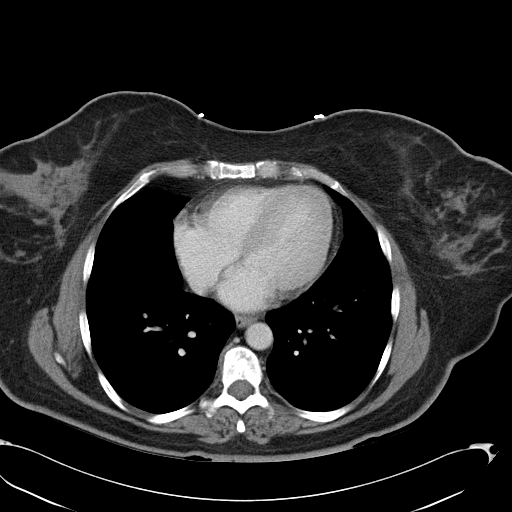

[Series 3: abd_pel_with 3.0 spo cor · coronal · 0.92mm/px · 3 of 113 slices shown]
[im 38/113  soft-tissue]
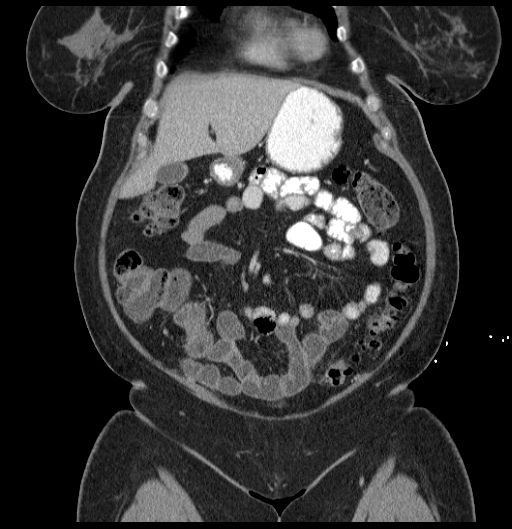
[im 50/113  soft-tissue]
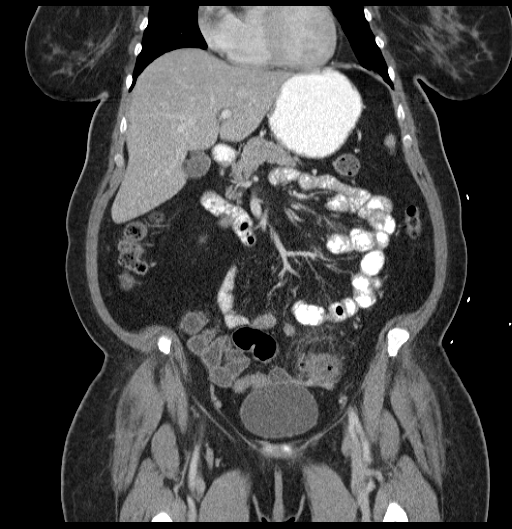
[im 63/113  soft-tissue]
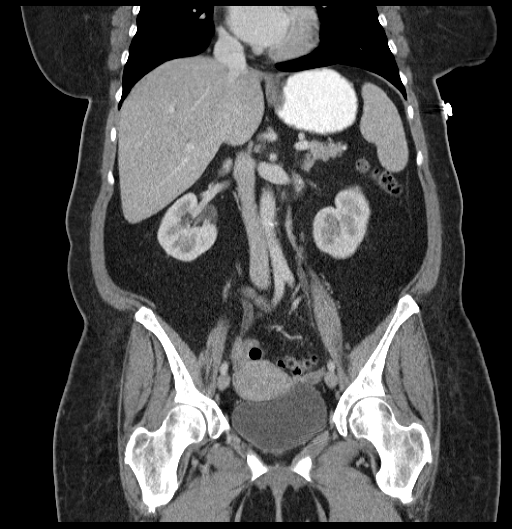

[17 of 46 positions shown; findings below may reference images not displayed]

FINDINGS: Lung bases are clear.

Mild diffuse fatty infiltration of the liver. Sub cm low-attenuation
lesion in the right lobe of the liver probably represents a small
cyst. No change since prior study. The gallbladder, spleen,
pancreas, adrenal glands, kidneys, abdominal aorta, inferior vena
cava, and retroperitoneal lymph nodes are unremarkable. Stomach,
small bowel, and colon are not abnormally distended. No free air or
free fluid in the abdomen. Abdominal wall musculature appears
intact.

Pelvis: Diverticulosis of the sigmoid colon with infiltration around
the pericolonic fat consistent with acute diverticulitis. No
abscess. Small amount of free fluid in the pelvis is likely
reactive. Bladder wall is not thickened. Uterus and ovaries are not
enlarged. Appendix is not identified. No destructive bone lesions.
IMPRESSION: Diverticulosis of the sigmoid colon with inflammatory changes in the
pericolonic fat consistent with acute diverticulitis. No abscess.

## 2015-10-19 ENCOUNTER — Encounter: Payer: Self-pay | Admitting: Family Medicine

## 2015-10-19 ENCOUNTER — Ambulatory Visit (INDEPENDENT_AMBULATORY_CARE_PROVIDER_SITE_OTHER): Payer: Self-pay

## 2015-10-19 ENCOUNTER — Encounter (INDEPENDENT_AMBULATORY_CARE_PROVIDER_SITE_OTHER): Payer: Self-pay

## 2015-10-19 ENCOUNTER — Encounter: Payer: Self-pay | Admitting: Orthopaedic Surgery

## 2015-10-19 ENCOUNTER — Ambulatory Visit (INDEPENDENT_AMBULATORY_CARE_PROVIDER_SITE_OTHER): Payer: Self-pay | Admitting: Orthopaedic Surgery

## 2015-10-19 ENCOUNTER — Ambulatory Visit (INDEPENDENT_AMBULATORY_CARE_PROVIDER_SITE_OTHER): Payer: Self-pay | Admitting: Family Medicine

## 2015-10-19 ENCOUNTER — Telehealth: Payer: Self-pay

## 2015-10-19 VITALS — BP 151/93 | HR 61 | Temp 97.0°F | Ht 61.52 in | Wt 187.4 lb

## 2015-10-19 VITALS — BP 141/77 | HR 66 | Temp 97.3°F | Ht 61.0 in | Wt 186.2 lb

## 2015-10-19 DIAGNOSIS — I10 Essential (primary) hypertension: Secondary | ICD-10-CM

## 2015-10-19 DIAGNOSIS — M5441 Lumbago with sciatica, right side: Secondary | ICD-10-CM

## 2015-10-19 DIAGNOSIS — M25561 Pain in right knee: Secondary | ICD-10-CM

## 2015-10-19 DIAGNOSIS — R079 Chest pain, unspecified: Secondary | ICD-10-CM

## 2015-10-19 DIAGNOSIS — I259 Chronic ischemic heart disease, unspecified: Secondary | ICD-10-CM

## 2015-10-19 DIAGNOSIS — M545 Low back pain: Secondary | ICD-10-CM

## 2015-10-19 DIAGNOSIS — M25511 Pain in right shoulder: Secondary | ICD-10-CM

## 2015-10-19 MED ORDER — CARVEDILOL 6.25 MG PO TABS: 6.2500 mg | ORAL_TABLET | Freq: Two times a day (BID) | ORAL | 3 refills | Status: DC

## 2015-10-19 NOTE — Telephone Encounter (Signed)
Order placed  Lisa SinkSam Arianah Torgeson, MD [facility] 10/19/2015, 5:18 PM

## 2015-10-19 NOTE — Progress Notes (Signed)
Patient ZO:XWRUEAV:Lisa [name], female DOB:[birth year], 55 y.o. [id number]  Chief Complaint  Patient presents with  . Follow-up    Back pain    HPI  Lisa [name] is a 55 y.o. female who has chronic lower back pain.  She is here today for her right knee.  She has swelling at times and popping but no giving way, no locking, no redness.  She has tried heat and ice and rest with some help. She takes Tumeric which helps a lot.  HPI  Body mass index is 35.18 kg/m.  ROS  Review of Systems  HENT: Negative for congestion.   Respiratory: Positive for shortness of breath. Negative for cough.   Cardiovascular: Positive for chest pain. Negative for leg swelling.  Endocrine: Positive for cold intolerance.  Musculoskeletal: Positive for arthralgias and back pain.  Allergic/Immunologic: Positive for environmental allergies.  Psychiatric/Behavioral: The patient is nervous/anxious.     Past Medical History:  Diagnosis Date  . Allergy   . Anemia   . Arthritis   . Asthma   . Bursitis   . Chronic back pain    Patient reports lower back disc problems  . Degenerative lumbar disc   . Diverticulitis   . Enlarged uterus 03/18/2014  . Essential hypertension, benign   . GERD (gastroesophageal reflux disease)   . High blood pressure   . High cholesterol   . Mixed hyperlipidemia   . Mixed stress and urge urinary incontinence 03/18/2014  . Myocardial infarction [facility](HCC)    Patient reports related to "steroids" in 1985 ([place])  . PMB (postmenopausal bleeding) 03/18/2014  . Seizures (HCC)   . Stroke (HCC)   . Tendinitis    Patient reports chronic problem involving her arms and hands    Past Surgical History:  Procedure Laterality Date  . adnoids    . CESAREAN SECTION    . COLONOSCOPY N/A 06/04/2014   SLF: 1. moderate diverticulosis in teh sigmoid colon, descending colon, and transverse colon. 2. Two colon polyps removed 3. Large external and small internal hemorroids.   . TONSILLECTOMY       Family History  Problem Relation Age of Onset  . Adopted: Yes  . Other Mother     aneursym  . Other Maternal Uncle     stenosis  . Arthritis Maternal Uncle   . Angina Son   . ADD / ADHD Son   . Other Son     food allergies  . Blindness Daughter     from birth; brain damage  . Drug abuse Sister   . Heart attack Paternal Aunt   . Alcohol abuse Cousin   . Drug abuse Cousin     Social History Social History  Substance Use Topics  . Smoking status: Former Smoker    Packs/day: 2.00    Years: 12.00    Types: Cigarettes    Quit date: 03/13/1985  . Smokeless tobacco: Never Used     Comment: Quit x 10 years ago  . Alcohol use No    Allergies  Allergen Reactions  . Metoprolol Nausea And Vomiting    Dizziness Elevated BP  . Morphine And Related Other (See Comments)    Disoriented, vomiting and nausea  . Naproxen Other (See Comments)    Lightheadedness  . Tramadol Nausea And Vomiting  . Ibuprofen Rash  . Penicillins Rash    Current Outpatient Prescriptions  Medication Sig Dispense Refill  . albuterol (PROVENTIL HFA;VENTOLIN HFA) 108 (90 BASE) MCG/ACT inhaler Inhale 2 puffs  into the lungs every 6 (six) hours as needed for wheezing.    Marland Kitchen aspirin EC 81 MG tablet Take 81 mg by mouth daily.    Marland Kitchen atorvastatin (LIPITOR) 40 MG tablet Take 40 mg by mouth daily.    . carvedilol (COREG) 6.25 MG tablet Take 1 tablet (6.25 mg total) by mouth 2 (two) times daily with a meal. 60 tablet 3  . cholecalciferol (VITAMIN D) 1000 units tablet Take 1,000 Units by mouth daily.    . Cyanocobalamin (B-12 PO) Take 1 tablet by mouth daily.    Marland Kitchen dexlansoprazole (DEXILANT) 60 MG capsule Take 1 capsule (60 mg total) by mouth daily. 20 capsule 0  . DimenhyDRINATE (EQ MOTION SICKNESS PO) Apply 1 application topically as needed (for nausea/vomiting). Reported on 08/25/2015    . Garlic 10 MG CAPS Take by mouth. Eats a raw piece of garlic every morning    . hydrochlorothiazide (HYDRODIURIL) 25 MG tablet  Take 1 tablet (25 mg total) by mouth daily. 30 tablet 5  . linaclotide (LINZESS) 72 MCG capsule Take 1 capsule (72 mcg total) by mouth daily before breakfast. 20 capsule 0  . methylcellulose (FIBER THERAPY) oral powder Take 1 packet by mouth daily. 1 tsp daily in the morning    . metroNIDAZOLE (FLAGYL) 500 MG tablet Take 1 tablet (500 mg total) by mouth 3 (three) times daily. 42 tablet 0   No current facility-administered medications for this visit.      Physical Exam  Blood pressure (!) 141/77, pulse 66, temperature 97.3 F (36.3 C), height 5\' 1"  (1.549 m), weight 186 lb 3.2 oz (84.5 kg), last menstrual period 12/26/2011.  Constitutional: overall normal hygiene, normal nutrition, well developed, normal grooming, normal body habitus. Assistive device:walker  Musculoskeletal: gait and station Limp right, muscle tone and strength are normal, no tremors or atrophy is present.  .  Neurological: coordination overall normal.  Deep tendon reflex/nerve stretch intact.  Sensation normal.  Cranial nerves II-XII intact.   Skin:   normal overall no scars, lesions, ulcers or rashes. No psoriasis.  Psychiatric: Alert and oriented x 3.  Recent memory intact, remote memory unclear.  Normal mood and affect. Well groomed.  Good eye contact.  Cardiovascular: overall no swelling, no varicosities, no edema bilaterally, normal temperatures of the legs and arms, no clubbing, cyanosis and good capillary refill.  Lymphatic: palpation is normal.  The right lower extremity is examined:  Inspection:  Thigh:  Non-tender and no defects  Knee has swelling 1+ effusion.                        Joint tenderness is present                        Patient is tender over the medial joint line  Lower Leg:  Has normal appearance and no tenderness or defects  Ankle:  Non-tender and no defects  Foot:  Non-tender and no defects Range of Motion:  Knee:  Range of motion is: 0-100                        Crepitus is   present  Ankle:  Range of motion is normal. Strength and Tone:  The right lower extremity has normal strength and tone. Stability:  Knee:  The knee is stable.  Ankle:  The ankle is stable.  X-rays were taken, reported separately.  The patient has been  educated about the nature of the problem(s) and counseled on treatment options.  The patient appeared to understand what I have discussed and is in agreement with it.  Encounter Diagnoses  Name Primary?  . Right knee pain Yes  . Right-sided low back pain with right-sided sciatica     PLAN Call if any problems.  Precautions discussed.  Continue current medications.   Return to clinic 1 month   Electronically Signed Darreld Mclean, MD 8/8/20172:30 PM

## 2015-10-19 NOTE — Progress Notes (Signed)
HPI  Patient presents today here for follow-up of chronic medical condition with several complaints.  Hypertension Blood pressure log is returned today showing average blood pressure of once experienced these systolic over 90s diastolic. Patient states that she's had central pressure-like chest pain with activity that stops after resting. It is nonradiating. She has associated sweating and shortness of breath. She states she has a history of ischemic heart disease. She does have a stress test from 2014 performed by cardiology that was normal. She has good medication compliance of HCTZ, she's now okay with starting additional medication.  Back pain Being evaluated by orthopedics, she needs an additional referral for insurance purposes, she has follow-up appointment today.  Right knee pain Has been going on for several years but worse over the last 1 week. Notes that she has locking and popping symptoms, medial knee pain, and increased pain with activity. No swelling or recent injury.  Right shoulder pain States that she "dislocated" her shoulder about a week ago after moving a large piece of furniture. She states that she was able to relocate it herself but has had some mild pain since that time. She has pain with specific activities  PMH: Smoking status noted ROS: Per HPI  Objective: BP (!) 151/93 (BP Location: Left Arm, Patient Position: Sitting, Cuff Size: Large)   Pulse 61   Temp 97 F (36.1 C) (Oral)   Ht 5' 1.52" (1.563 m)   Wt 187 lb 6.4 oz (85 kg)   LMP 12/26/2011   BMI 34.81 kg/m  Gen: NAD, alert, cooperative with exam HEENT: NCAT CV: RRR, good S1/S2, no murmur Resp: CTABL, no wheezes, non-labored Ext: No edema, warm Neuro: Alert and oriented, No gross deficits  MSK MSK: R knee with medial joint line tenderness  ligamentously intact to Lachman's and with varus and valgus stress.  Pain on the medial side with McMurray's test  Shoulder Right shoulder with  tenderness to palpation over the posterior deltoid, reasonably maintained range of motion however some limitations on the right on Apley scratch test. Negative Hawkins sign, increased pain with Neer's and empty can test    Assessment and plan:  # Hypertension Elevated today, clearly elevated at home as well Uncontrolled Adding carvedilol, previously had metoprolol allergy, nausea and vomiting. Considered lisinopril, however she's resistant to getting labs today and I feel these are necessary for starting and monitoring lisinopril. Will likely need to titrate carvedilol slightly  # Ischemic heart disease, chest pain Her chest pain is concerning for stable angina, it's predictable coming on with exercise and resolved after rest Starting beta blocker Recommended calling cardiology today for an appointment, she is an established patient, she agrees No chest pain today  # Back pain Referring to orthopedics per her request, appreciate Dr. Sanjuan Dame management.  # Right knee pain Again referring to orthopedics With locking and popping symptoms as well as medial joint line pain there is some concern for meniscal injury. Conservative treatment for now  Right shoulder pain Mild, improving Reports recent dislocation and self relocation. Exam positive for impingement signs as above Again I very much appreciate orthopedics evaluation and management of this patient with several musculoskeletal pain complaints.    Orders Placed This Encounter  Procedures  . Ambulatory referral to Orthopedic Surgery    Referral Priority:   Routine    Referral Type:   Surgical    Referral Reason:   Specialty Services Required    Requested Specialty:   Orthopedic Surgery  Number of Visits Requested:   1    Meds ordered this encounter  Medications  . carvedilol (COREG) 6.25 MG tablet    Sig: Take 1 tablet (6.25 mg total) by mouth 2 (two) times daily with a meal.    Dispense:  60 tablet    Refill:  3     Murtis SinkSam Zeniyah Peaster, MD [facility] 10/19/2015, 11:38 AM

## 2015-10-19 NOTE — Patient Instructions (Signed)
Great to see you!  Start coreg 1 pill twice daily as soon as possible  Call today for a cardiology appointment

## 2015-11-16 ENCOUNTER — Ambulatory Visit: Payer: Self-pay | Admitting: Orthopaedic Surgery

## 2015-11-17 NOTE — Progress Notes (Signed)
Cardiology Office Note  Date: 11/18/2015   ID: Lisa AntiguaFrances M Elling, DOB 12-25-1960, MRN [id number]  PCP: Kevin FentonSamuel Bradshaw, MD  Primary Cardiologist: Nona DellSamuel McDowell, MD   Chief Complaint  Patient presents with  . Cardiac follow-up    History of Present Illness: Lisa [name] is a 55 y.o. female that I last saw in April 2016. She had an interval visit with Dr. Antoine PocheHochrein. She is referred back for a follow-up visit. Is somewhat difficult to get a sense for her symptomatology, she is a tangential historian and discuss his many things that are often topic. She tells me that she is in the process of selling her home and moving back to [place] sometime early next year. She has been moving boxes. She states that she has "good days and bad days," but does not report any progressive exertional chest pain.  She was seen recently by Dr. Ermalinda MemosBradshaw, at that time started on Coreg. Her blood pressure does look better today. She states that she has tolerated this medication.  Last ischemic evaluation was in 2014 as detailed below. I reviewed her ECG today which shows sinus bradycardia with borderline low voltage and nonspecific T-wave changes.  Past Medical History:  Diagnosis Date  . Allergy   . Anemia   . Arthritis   . Asthma   . Bursitis   . Chronic back pain    Patient reports lower back disc problems  . Degenerative lumbar disc   . Diverticulitis   . Enlarged uterus 03/18/2014  . Essential hypertension, benign   . GERD (gastroesophageal reflux disease)   . High blood pressure   . High cholesterol   . Mixed hyperlipidemia   . Mixed stress and urge urinary incontinence 03/18/2014  . Myocardial infarction [facility](HCC)    Patient reports related to "steroids" in 1985 ([place])  . PMB (postmenopausal bleeding) 03/18/2014  . Seizures (HCC)   . Stroke (HCC)   . Tendinitis    Patient reports chronic problem involving her arms and hands    Past Surgical History:  Procedure Laterality Date  . adnoids    .  CESAREAN SECTION    . COLONOSCOPY N/A 06/04/2014   SLF: 1. moderate diverticulosis in teh sigmoid colon, descending colon, and transverse colon. 2. Two colon polyps removed 3. Large external and small internal hemorroids.   . TONSILLECTOMY      Current Outpatient Prescriptions  Medication Sig Dispense Refill  . albuterol (PROVENTIL HFA;VENTOLIN HFA) 108 (90 BASE) MCG/ACT inhaler Inhale 2 puffs into the lungs every 6 (six) hours as needed for wheezing.    Marland Kitchen. aspirin EC 81 MG tablet Take 81 mg by mouth daily.    . carvedilol (COREG) 6.25 MG tablet Take 1 tablet (6.25 mg total) by mouth 2 (two) times daily with a meal. 60 tablet 3  . cholecalciferol (VITAMIN D) 1000 units tablet Take 1,000 Units by mouth daily.    . Cyanocobalamin (B-12 PO) Take 1 tablet by mouth daily.    Marland Kitchen. dexlansoprazole (DEXILANT) 60 MG capsule Take 1 capsule (60 mg total) by mouth daily. 20 capsule 0  . DimenhyDRINATE (EQ MOTION SICKNESS PO) Apply 1 application topically as needed (for nausea/vomiting). Reported on 08/25/2015    . Garlic 10 MG CAPS Take by mouth. Eats a raw piece of garlic every morning    . linaclotide (LINZESS) 72 MCG capsule Take 1 capsule (72 mcg total) by mouth daily before breakfast. 20 capsule 0  . methylcellulose (FIBER THERAPY) oral powder Take  1 packet by mouth daily. 1 tsp daily in the morning     No current facility-administered medications for this visit.    Allergies:  Metoprolol; Morphine and related; Naproxen; Tramadol; Ibuprofen; and Penicillins   Social History: The patient  reports that she quit smoking about 30 years ago. Her smoking use included Cigarettes. She has a 24.00 pack-year smoking history. She has never used smokeless tobacco. She reports that she does not drink alcohol or use drugs.   ROS:  Please see the history of present illness. Otherwise, complete review of systems is positive for emotional stress, intermittent fatigue.  All other systems are reviewed and negative.    Physical Exam: VS:  BP 130/80   Pulse 64   Ht 5' (1.524 m)   Wt 186 lb (84.4 kg)   LMP 12/26/2011   SpO2 94%   BMI 36.33 kg/m , BMI Body mass index is 36.33 kg/m.  Wt Readings from Last 3 Encounters:  11/18/15 186 lb (84.4 kg)  10/19/15 186 lb 3.2 oz (84.5 kg)  10/19/15 187 lb 6.4 oz (85 kg)    Obese woman, appears comfortable at rest.  HEENT: Conjunctiva and lids normal, oropharynx clear.  Neck: Supple, no elevated JVP or carotid bruits, no thyromegaly.  Lungs: Clear to auscultation, nonlabored breathing at rest.  Cardiac: Regular rate and rhythm, no S3 or significant systolic murmur, no pericardial rub.  Abdomen: Soft, nontender, bowel sounds present, no guarding or rebound.  Extremities: Trace ankle edema, distal pulses 1-2+.  Skin: Warm and dry. Musculoskeletal: No kyphosis. Neuropsychiatric: Alert and oriented 3, affect appropriate.  ECG: I personally reviewed the tracing from 02/10/2015 which showed sinus rhythm with Q-wave in lead III.  Recent Labwork: 12/15/2014: TSH 2.300 03/17/2015: ALT 35; AST 22 07/14/2015: Magnesium 1.7 07/15/2015: BUN <5; Creatinine, Ser 0.59; Hemoglobin 12.0; Platelets 251; Potassium 3.5; Sodium 139     Component Value Date/Time   CHOL 179 03/17/2015 1339   TRIG 60 03/17/2015 1339   HDL 68 03/17/2015 1339   CHOLHDL 2.6 03/17/2015 1339   LDLCALC 99 03/17/2015 1339    Other Studies Reviewed Today:  Dobutamine Cardiolite September 2014: No diagnostic ST segment changes, small area of ischemia in the RCA distribution, LVEF 71%, overall low risk.   Echocardiogram September 2014: Mild LVH with LVEF 65-70%, grade 1 diastolic dysfunction, trivial tricuspid regurgitation, unable to calculate PASP, CVP 8 mm mercury, no pericardial effusion.  Assessment and Plan:  1. History of abnormal Cardiolite as outlined above, possible underlying ischemic heart disease. She has been managed medically, currently on aspirin and Coreg which is a recent  addition. We discussed diet and exercise and continuing observation for now. She is not reporting any clear-cut accelerating angina. I recommended a regular walking regimen.  2. Essential hypertension, blood pressure looks better on Coreg.  3. Elevated cholesterol by history. LDL was 99 in January. Could consider low-dose statin, she had previously been on Zocor at one point.  4. Tobacco abuse in remission, remote history of smoking cigarettes.  Current medicines were reviewed with the patient today.   Orders Placed This Encounter  Procedures  . EKG 12-Lead    Disposition: Follow-up with me in 6 months.  Signed, Jonelle Sidle, MD, [facility] 11/18/2015 8:15 AM    [facility] [address], [place], [place] [address] Phone: [phone]; Fax: [phone]

## 2015-11-18 ENCOUNTER — Ambulatory Visit: Payer: Self-pay | Admitting: Orthopaedic Surgery

## 2015-11-18 ENCOUNTER — Encounter: Payer: Self-pay | Admitting: Cardiology

## 2015-11-18 ENCOUNTER — Ambulatory Visit (INDEPENDENT_AMBULATORY_CARE_PROVIDER_SITE_OTHER): Payer: Self-pay | Admitting: Cardiology

## 2015-11-18 VITALS — BP 130/80 | HR 64 | Ht 60.0 in | Wt 186.0 lb

## 2015-11-18 DIAGNOSIS — F17201 Nicotine dependence, unspecified, in remission: Secondary | ICD-10-CM

## 2015-11-18 DIAGNOSIS — I10 Essential (primary) hypertension: Secondary | ICD-10-CM

## 2015-11-18 DIAGNOSIS — E782 Mixed hyperlipidemia: Secondary | ICD-10-CM

## 2015-11-18 DIAGNOSIS — I259 Chronic ischemic heart disease, unspecified: Secondary | ICD-10-CM

## 2015-11-18 NOTE — Patient Instructions (Signed)
Your physician wants you to follow-up in: 6 months Dr McDowell You will receive a reminder letter in the mail two months in advance. If you don't receive a letter, please call our office to schedule the follow-up appointment.    Your physician recommends that you continue on your current medications as directed. Please refer to the Current Medication list given to you today.     Thank you for choosing [facility] !        

## 2015-12-21 ENCOUNTER — Ambulatory Visit (INDEPENDENT_AMBULATORY_CARE_PROVIDER_SITE_OTHER): Payer: Self-pay | Admitting: Family Medicine

## 2015-12-21 ENCOUNTER — Encounter: Payer: Self-pay | Admitting: Family Medicine

## 2015-12-21 VITALS — BP 157/88 | HR 64 | Temp 97.1°F | Ht 61.52 in | Wt 188.0 lb

## 2015-12-21 DIAGNOSIS — I10 Essential (primary) hypertension: Secondary | ICD-10-CM

## 2015-12-21 DIAGNOSIS — Z6834 Body mass index (BMI) 34.0-34.9, adult: Secondary | ICD-10-CM

## 2015-12-21 DIAGNOSIS — E6609 Other obesity due to excess calories: Secondary | ICD-10-CM

## 2015-12-21 DIAGNOSIS — R7309 Other abnormal glucose: Secondary | ICD-10-CM

## 2015-12-21 DIAGNOSIS — E669 Obesity, unspecified: Secondary | ICD-10-CM | POA: Insufficient documentation

## 2015-12-21 DIAGNOSIS — Z0000 Encounter for general adult medical examination without abnormal findings: Secondary | ICD-10-CM

## 2015-12-21 LAB — BMP8+EGFR
BUN/Creatinine Ratio: 17 (ref 9–23)
BUN: 11 mg/dL (ref 6–24)
CALCIUM: 9.6 mg/dL (ref 8.7–10.2)
CO2: 25 mmol/L (ref 18–29)
CREATININE: 0.66 mg/dL (ref 0.57–1.00)
Chloride: 99 mmol/L (ref 96–106)
GFR, EST AFRICAN AMERICAN: 114 mL/min/{1.73_m2} (ref >59)
GFR, EST NON AFRICAN AMERICAN: 99 mL/min/{1.73_m2} (ref >59)
Glucose: 110 mg/dL — ABNORMAL HIGH (ref 65–99)
Potassium: 4.1 mmol/L (ref 3.5–5.2)
Sodium: 140 mmol/L (ref 134–144)

## 2015-12-21 MED ORDER — LISINOPRIL 10 MG PO TABS: 10.0000 mg | ORAL_TABLET | Freq: Every day | ORAL | 3 refills | Status: DC

## 2015-12-21 NOTE — Patient Instructions (Signed)
Great to see you!  Keep up the good work watching your dieta nd walking  I have started you on lisinopril, take 1 pill once a day

## 2015-12-21 NOTE — Progress Notes (Signed)
   HPI  Patient presents today for follow-up of chronic medical condition.  HTN Blood pressures been ranging 140s and 150s at home on Coreg Reports one episode where she had elevated blood pressure to 878 systolic No chest pain, dyspnea, palpitations currently.  Obesity She's walking daily, limited by back pain. She is also watching her diet.  He is willing to get a mammogram of the like to schedule that locally if possible   PMH: Smoking status noted ROS: Per HPI  Objective: BP (!) 157/88   Pulse 64   Temp 97.1 F (36.2 C) (Oral)   Ht 5' 1.52" (1.563 m)   Wt 188 lb (85.3 kg)   LMP 12/26/2011   BMI 34.92 kg/m  Gen: NAD, alert, cooperative with exam HEENT: NCAT CV: RRR, good S1/S2, no murmur Resp: CTABL, no wheezes, non-labored Ext: No edema, warm Neuro: Alert and oriented, No gross deficits  Assessment and plan:  # Hypertension Elevated today, good medication compliance, pulse is controlled at 64 Adding lisinopril with some concern for ischemic heart disease repeat labs in one month  # Healthcare maintenance Schedule mammogram today  # Obesity Encouraged her to continue walking and watching her diet Her walking and exercises limited by chronic back pain.    Orders Placed This Encounter  Procedures  . MM Digital Screening    Standing Status:   Future    Standing Expiration Date:   02/19/2017    Order Specific Question:   Reason for Exam (SYMPTOM  OR DIAGNOSIS REQUIRED)    Answer:   screening    Order Specific Question:   Is the patient pregnant?    Answer:   No    Order Specific Question:   Preferred imaging location?    Answer:   External  . BMP8+EGFR    Meds ordered this encounter  Medications  . lisinopril (PRINIVIL,ZESTRIL) 10 MG tablet    Sig: Take 1 tablet (10 mg total) by mouth daily.    Dispense:  90 tablet    Refill:  Myrtle, MD [facility] 12/21/2015, 10:48 AM

## 2015-12-22 ENCOUNTER — Telehealth: Payer: Self-pay | Admitting: Family Medicine

## 2015-12-22 LAB — BAYER DCA HB A1C WAIVED: HB A1C: 5.8 % (ref <7.0)

## 2015-12-22 NOTE — Addendum Note (Signed)
Addended by: Lorelee CoverOSTOSKY, Jkwon Treptow C on: 12/22/2015 02:26 PM   Modules accepted: Orders

## 2015-12-23 ENCOUNTER — Other Ambulatory Visit: Payer: Self-pay | Admitting: Family Medicine

## 2015-12-23 MED ORDER — LISINOPRIL 10 MG PO TABS: 10.0000 mg | ORAL_TABLET | Freq: Every day | ORAL | 3 refills | Status: DC

## 2015-12-23 NOTE — Telephone Encounter (Signed)
Use [facility] until pt brings in paperwork

## 2015-12-23 NOTE — Telephone Encounter (Signed)
done

## 2016-01-03 ENCOUNTER — Encounter: Payer: Self-pay | Admitting: Gastroenterology

## 2016-01-03 ENCOUNTER — Other Ambulatory Visit: Payer: Self-pay

## 2016-01-03 ENCOUNTER — Ambulatory Visit (INDEPENDENT_AMBULATORY_CARE_PROVIDER_SITE_OTHER): Payer: Self-pay | Admitting: Gastroenterology

## 2016-01-03 VITALS — BP 149/83 | HR 58 | Temp 97.7°F | Ht 60.0 in | Wt 187.0 lb

## 2016-01-03 DIAGNOSIS — K5732 Diverticulitis of large intestine without perforation or abscess without bleeding: Secondary | ICD-10-CM

## 2016-01-03 DIAGNOSIS — K581 Irritable bowel syndrome with constipation: Secondary | ICD-10-CM

## 2016-01-03 DIAGNOSIS — K219 Gastro-esophageal reflux disease without esophagitis: Secondary | ICD-10-CM

## 2016-01-03 MED ORDER — CIPROFLOXACIN HCL 500 MG PO TABS: 500.0000 mg | ORAL_TABLET | Freq: Two times a day (BID) | ORAL | 0 refills | Status: DC

## 2016-01-03 MED ORDER — LINACLOTIDE 72 MCG PO CAPS: 72.0000 ug | ORAL_CAPSULE | Freq: Every day | ORAL | 11 refills | Status: DC

## 2016-01-03 MED ORDER — METRONIDAZOLE 250 MG PO TABS: 250.0000 mg | ORAL_TABLET | Freq: Four times a day (QID) | ORAL | 0 refills | Status: DC

## 2016-01-03 NOTE — Assessment & Plan Note (Signed)
GERD well-controlled on Dexilant. Chronic gerd for more than five years, no prior EGD in the past. Has not been screened for Barrett's. Patient not interested at this time. May reconsider at next OV. Return to office in six months to see Dr. Darrick PennaFields.

## 2016-01-03 NOTE — Progress Notes (Signed)
Primary Care Physician: Kevin FentonSamuel Bradshaw, MD  Primary Gastroenterologist:  Jonette EvaSandi Fields, MD   Chief Complaint  Patient presents with  . Follow-up    Linzess too strong (watery diarrhea)    HPI: Lisa [name] is a 55 y.o. female here for follow-up. She has a history of recurrent diverticulitis, IBS-C, hemorrhoids, GERD. Last seen April 2017.Advised to pursue elective partial colectomy the patient was unable to afford surgery being uninsured. 2 documented episodes of diverticulitis in 2016 via CT scan (May and September). Patient was started on Dexilant 60 mg daily, last office visit she reported chronic GERD and was having issues. She was also switched from Linzess 145 mcg to 72 mcg due to watery diarrhea.   She's had another episode of diverticulitis since we saw her. CT documented on 07/13/15. GERD well-controlled on Dexilant. Last night she started having burning sensation in LLQ and goes across. Using charcoal tablet. Jalapeno in food and was unaware. Happens off/on usually symptoms don't persist if it was related to eating food that doesn't agree with her. This feels more like another episode of diverticulitis. states low grade fever at home. BM 3 per day. She notes that she is still on Linzess 145mcg, she was never switched at the health dept. Linzess 72mcg samples we provided worked well for her. No melena, brbpr.    Current Outpatient Prescriptions  Medication Sig Dispense Refill  . albuterol (PROVENTIL HFA;VENTOLIN HFA) 108 (90 BASE) MCG/ACT inhaler Inhale 2 puffs into the lungs every 6 (six) hours as needed for wheezing.    Marland Kitchen. aspirin EC 81 MG tablet Take 81 mg by mouth daily.    . carvedilol (COREG) 6.25 MG tablet Take 1 tablet (6.25 mg total) by mouth 2 (two) times daily with a meal. 60 tablet 3  . CHARCOAL ACTIVATED PO Take 2 tablets by mouth as needed.    . cholecalciferol (VITAMIN D) 1000 units tablet Take 1,000 Units by mouth daily.    . Cyanocobalamin (B-12 PO) Take 1  tablet by mouth daily.    Marland Kitchen. dexlansoprazole (DEXILANT) 60 MG capsule Take 1 capsule (60 mg total) by mouth daily. 20 capsule 0  . DimenhyDRINATE (EQ MOTION SICKNESS PO) Apply 1 application topically as needed (for nausea/vomiting). Reported on 08/25/2015    . Garlic 10 MG CAPS Take by mouth. Eats a raw piece of garlic every morning    . lisinopril (PRINIVIL,ZESTRIL) 10 MG tablet Take 1 tablet (10 mg total) by mouth daily. 90 tablet 3  . methylcellulose (FIBER THERAPY) oral powder Take 1 packet by mouth 3 (three) times daily. 1 tsp daily in the morning    .       .  linzess 145mcg daily     .       No current facility-administered medications for this visit.     Allergies as of 01/03/2016 - Review Complete 01/03/2016  Allergen Reaction Noted  . Metoprolol Nausea And Vomiting 05/27/2013  . Morphine and related Other (See Comments) 02/14/2012  . Naproxen Other (See Comments) 02/14/2012  . Tramadol Nausea And Vomiting 09/15/2013  . Ibuprofen Rash 02/14/2012  . Penicillins Rash 02/14/2012   Past Medical History:  Diagnosis Date  . Allergy   . Anemia   . Arthritis   . Asthma   . Bursitis   . Chronic back pain    Patient reports lower back disc problems  . Degenerative lumbar disc   . Diverticulitis   . Enlarged uterus 03/18/2014  .  Essential hypertension, benign   . GERD (gastroesophageal reflux disease)   . High blood pressure   . High cholesterol   . Mixed hyperlipidemia   . Mixed stress and urge urinary incontinence 03/18/2014  . Myocardial infarction    Patient reports related to "steroids" in 1985 ([place])  . PMB (postmenopausal bleeding) 03/18/2014  . Seizures (HCC)   . Stroke (HCC)   . Tendinitis    Patient reports chronic problem involving her arms and hands   Past Surgical History:  Procedure Laterality Date  . adnoids    . CESAREAN SECTION    . COLONOSCOPY N/A 06/04/2014   SLF: 1. moderate diverticulosis in teh sigmoid colon, descending colon, and transverse colon. 2.  Two colon polyps removed 3. Large external and small internal hemorroids.   . TONSILLECTOMY      ROS:  General: Negative for anorexia, weight loss, fever, chills, fatigue, weakness. ENT: Negative for hoarseness, difficulty swallowing , nasal congestion. CV: Negative for chest pain, angina, palpitations, dyspnea on exertion, peripheral edema.  Respiratory: Negative for dyspnea at rest, dyspnea on exertion, cough, sputum, wheezing.  GI: See history of present illness. GU:  Negative for dysuria, hematuria, urinary incontinence, urinary frequency, nocturnal urination.  Endo: Negative for unusual weight change.    Physical Examination:   BP (!) 149/83   Pulse (!) 58   Temp 97.7 F (36.5 C) (Oral)   Ht 5' (1.524 m)   Wt 187 lb (84.8 kg)   LMP 12/26/2011   BMI 36.52 kg/m   General: Well-nourished, well-developed in no acute distress.  Eyes: No icterus. Mouth: Oropharyngeal mucosa moist and pink , no lesions erythema or exudate. Lungs: Clear to auscultation bilaterally.  Heart: Regular rate and rhythm, no murmurs rubs or gallops.  Abdomen: Bowel sounds are normal,  nondistended, no hepatosplenomegaly or masses, no abdominal bruits or hernia , no rebound or guarding.  LLQ tenderness moderate.  Extremities: No lower extremity edema. No clubbing or deformities. Neuro: Alert and oriented x 4   Skin: Warm and dry, no jaundice.   Psych: Alert and cooperative, normal mood and affect.  Labs:  Lab Results  Component Value Date   CREATININE 0.66 12/21/2015   BUN 11 12/21/2015   NA 140 12/21/2015   K 4.1 12/21/2015   CL 99 12/21/2015   CO2 25 12/21/2015   Lab Results  Component Value Date   ALT 35 (H) 03/17/2015   AST 22 03/17/2015   ALKPHOS 69 03/17/2015   BILITOT 0.2 03/17/2015   Lab Results  Component Value Date   WBC 6.1 07/15/2015   HGB 12.0 07/15/2015   HCT 35.0 (L) 07/15/2015   MCV 87.1 07/15/2015   PLT 251 07/15/2015    Imaging Studies: No results found.

## 2016-01-03 NOTE — Progress Notes (Signed)
cc'ed to pcp °

## 2016-01-03 NOTE — Assessment & Plan Note (Signed)
Did well on Linzess . Trying to get patient assistance switched.

## 2016-01-03 NOTE — Patient Instructions (Signed)
1. Cut back to soft diet until your abdominal pain improves. I have sent in RX for Cipro and Flagyl to take for 10 days. Call if persistent symptoms.  2. Referral for elective surgery for recurrent diverticulitis will be [facility]. Please let us know if you don't hear from them in the next two weeks.  3. Decrease Linzess to 72mcg daily for constipation.  4. Continue Dexilant 60mg  daily for GERD. 5. Return to the office in six months to see Dr. Darrick PennaFields.

## 2016-01-03 NOTE — Assessment & Plan Note (Addendum)
H/o recurrent diverticulitis. 2 episodes in 2016, one in May 2017. She reports 5 total episodes, at least 4 hospitalizations (last hospitalization in May 2017). Patient has [place] assistance and would like to see a surgeon to consider elective partial colectomy. [facility]. Previously had issues with obtaining transportation but she states she has that worked out now.   Current LLQ tenderness, likely recurrent diverticulitis. Cipro/Flagyl provided. Back down to soft diet until symptoms significantly improve. Call if ongoing symptoms. Return to office in 6 months for routine follow-up.

## 2016-01-11 ENCOUNTER — Telehealth: Payer: Self-pay

## 2016-01-11 NOTE — Telephone Encounter (Signed)
Pt called and said that the [facility] Dept has Linzess 145 mcg samples for her, but they are too strong.  She said she saw Tana CoastLeslie Lewis, PA in the office and she told her to let her know if she had problems getting the correct strength medication.  Verlon AuLeslie, Please advise!

## 2016-01-11 NOTE — Telephone Encounter (Signed)
Please look into this. I made note on encounter form day of OV to look into having Linzess switched to . If we have to, can we have patient assistance done through our office.

## 2016-01-11 NOTE — Telephone Encounter (Signed)
Raynelle FanningJulie, are you aware of this?

## 2016-01-13 ENCOUNTER — Ambulatory Visit (INDEPENDENT_AMBULATORY_CARE_PROVIDER_SITE_OTHER): Payer: Self-pay

## 2016-01-13 DIAGNOSIS — Z23 Encounter for immunization: Secondary | ICD-10-CM

## 2016-01-18 ENCOUNTER — Ambulatory Visit (INDEPENDENT_AMBULATORY_CARE_PROVIDER_SITE_OTHER): Payer: Self-pay | Admitting: Orthopaedic Surgery

## 2016-01-18 ENCOUNTER — Encounter: Payer: Self-pay | Admitting: Orthopaedic Surgery

## 2016-01-18 VITALS — BP 179/96 | HR 62 | Temp 97.3°F | Ht 60.0 in | Wt 186.0 lb

## 2016-01-18 DIAGNOSIS — M25562 Pain in left knee: Secondary | ICD-10-CM

## 2016-01-18 DIAGNOSIS — M25512 Pain in left shoulder: Secondary | ICD-10-CM

## 2016-01-18 DIAGNOSIS — M5441 Lumbago with sciatica, right side: Secondary | ICD-10-CM

## 2016-01-18 DIAGNOSIS — G8929 Other chronic pain: Secondary | ICD-10-CM

## 2016-01-18 NOTE — Progress Notes (Signed)
Patient Lisa [name], female DOB:[birth year], 55 y.o. YQM:[id number]  Chief Complaint  Patient presents with  . Follow-up    Back pain c/o right shoulder and left knee pain    HPI  Lisa [name] is a 55 y.o. female who has chronic lower back pain and pain of the left shoulder and left knee.  She has no new trauma.  She would like to go to PT and avoid any pain medicine or any NSAID.  I am agreeable to this.  HPI  Body mass index is 36.33 kg/m.  ROS  Review of Systems  HENT: Negative for congestion.   Respiratory: Positive for shortness of breath. Negative for cough.   Cardiovascular: Positive for chest pain. Negative for leg swelling.  Endocrine: Positive for cold intolerance.  Musculoskeletal: Positive for arthralgias and back pain.  Allergic/Immunologic: Positive for environmental allergies.  Psychiatric/Behavioral: The patient is nervous/anxious.     Past Medical History:  Diagnosis Date  . Allergy   . Anemia   . Arthritis   . Asthma   . Bursitis   . Chronic back pain    Patient reports lower back disc problems  . Degenerative lumbar disc   . Diverticulitis   . Enlarged uterus 03/18/2014  . Essential hypertension, benign   . GERD (gastroesophageal reflux disease)   . High blood pressure   . High cholesterol   . Mixed hyperlipidemia   . Mixed stress and urge urinary incontinence 03/18/2014  . Myocardial infarction    Patient reports related to "steroids" in 1985 ([place])  . PMB (postmenopausal bleeding) 03/18/2014  . Seizures (HCC)   . Stroke (HCC)   . Tendinitis    Patient reports chronic problem involving her arms and hands    Past Surgical History:  Procedure Laterality Date  . adnoids    . CESAREAN SECTION    . COLONOSCOPY N/A 06/04/2014   SLF: 1. moderate diverticulosis in teh sigmoid colon, descending colon, and transverse colon. 2. Two colon polyps removed 3. Large external and small internal hemorroids.   . TONSILLECTOMY      Family History   Problem Relation Age of Onset  . Adopted: Yes  . Other Mother     aneursym  . Other Maternal Uncle     stenosis  . Arthritis Maternal Uncle   . Angina Son   . ADD / ADHD Son   . Other Son     food allergies  . Blindness Daughter     from birth; brain damage  . Drug abuse Sister   . Heart attack Paternal Aunt   . Alcohol abuse Cousin   . Drug abuse Cousin     Social History Social History  Substance Use Topics  . Smoking status: Former Smoker    Packs/day: 2.00    Years: 12.00    Types: Cigarettes    Quit date: 03/13/1985  . Smokeless tobacco: Never Used     Comment: Quit x 10 years ago  . Alcohol use No    Allergies  Allergen Reactions  . Metoprolol Nausea And Vomiting    Dizziness Elevated BP  . Morphine And Related Other (See Comments)    Disoriented, vomiting and nausea  . Naproxen Other (See Comments)    Lightheadedness  . Tramadol Nausea And Vomiting  . Ibuprofen Rash  . Penicillins Rash    Current Outpatient Prescriptions  Medication Sig Dispense Refill  . albuterol (PROVENTIL HFA;VENTOLIN HFA) 108 (90 BASE) MCG/ACT inhaler Inhale 2 puffs into  the lungs every 6 (six) hours as needed for wheezing.    Marland Kitchen. aspirin EC 81 MG tablet Take 81 mg by mouth daily.    . carvedilol (COREG) 6.25 MG tablet Take 1 tablet (6.25 mg total) by mouth 2 (two) times daily with a meal. 60 tablet 3  . CHARCOAL ACTIVATED PO Take 2 tablets by mouth as needed.    . cholecalciferol (VITAMIN D) 1000 units tablet Take 1,000 Units by mouth daily.    . ciprofloxacin (CIPRO) 500 MG tablet Take 1 tablet (500 mg total) by mouth 2 (two) times daily. 20 tablet 0  . Cyanocobalamin (B-12 PO) Take 1 tablet by mouth daily.    Marland Kitchen. dexlansoprazole (DEXILANT) 60 MG capsule Take 1 capsule (60 mg total) by mouth daily. 20 capsule 0  . DimenhyDRINATE (EQ MOTION SICKNESS PO) Apply 1 application topically as needed (for nausea/vomiting). Reported on 08/25/2015    . Garlic 10 MG CAPS Take by mouth. Eats a raw  piece of garlic every morning    . linaclotide (LINZESS) 72 MCG capsule Take 1 capsule (72 mcg total) by mouth daily before breakfast. 30 capsule 11  . lisinopril (PRINIVIL,ZESTRIL) 10 MG tablet Take 1 tablet (10 mg total) by mouth daily. 90 tablet 3  . methylcellulose (FIBER THERAPY) oral powder Take 1 packet by mouth 3 (three) times daily. 1 tsp daily in the morning    . metroNIDAZOLE (FLAGYL) 250 MG tablet Take 1 tablet (250 mg total) by mouth 4 (four) times daily. With food 40 tablet 0   No current facility-administered medications for this visit.      Physical Exam  Blood pressure (!) 179/96, pulse 62, temperature 97.3 F (36.3 C), height 5' (1.524 m), weight 186 lb (84.4 kg), last menstrual period 12/26/2011.  Constitutional: overall normal hygiene, normal nutrition, well developed, normal grooming, normal body habitus. Assistive device:none  Musculoskeletal: gait and station Limp left, muscle tone and strength are normal, no tremors or atrophy is present.  .  Neurological: coordination overall normal.  Deep tendon reflex/nerve stretch intact.  Sensation normal.  Cranial nerves II-XII intact.   Skin:   Normal overall no scars, lesions, ulcers or rashes. No psoriasis.  Psychiatric: Alert and oriented x 3.  Recent memory intact, remote memory unclear.  Normal mood and affect. Well groomed.  Good eye contact.  Cardiovascular: overall no swelling, no varicosities, no edema bilaterally, normal temperatures of the legs and arms, no clubbing, cyanosis and good capillary refill.  Lymphatic: palpation is normal.  Spine/Pelvis examination:  Inspection:  Overall, sacoiliac joint benign and hips nontender; without crepitus or defects.   Thoracic spine inspection: Alignment normal without kyphosis present   Lumbar spine inspection:  Alignment  with normal lumbar lordosis, without scoliosis apparent.   Thoracic spine palpation:  without tenderness of spinal processes   Lumbar spine  palpation: with tenderness of lumbar area; without tightness of lumbar muscles    Range of Motion:   Lumbar flexion, forward flexion is 35 without pain or tenderness    Lumbar extension is 10 without pain or tenderness   Left lateral bend is Normal  without pain or tenderness   Right lateral bend is Normal without pain or tenderness   Straight leg raising is Normal   Strength & tone: Normal   Stability overall normal stability   Examination of left Upper Extremity is done.  Inspection:   Overall:  Elbow non-tender without crepitus or defects, forearm non-tender without crepitus or defects, wrist non-tender without  crepitus or defects, hand non-tender.    Shoulder: with glenohumeral joint tenderness, without effusion.   Upper arm: without swelling and tenderness   Range of motion:   Overall:  Full range of motion of the elbow, full range of motion of wrist and full range of motion in fingers.   Shoulder:  left  165 degrees forward flexion; 145 degrees abduction; 35 degrees internal rotation, 35 degrees external rotation, 15 degrees extension, 40 degrees adduction.   Stability:   Overall:  Shoulder, elbow and wrist stable   Strength and Tone:   Overall full shoulder muscles strength, full upper arm strength and normal upper arm bulk and tone.  The left lower extremity is examined:  Inspection:  Thigh:  Non-tender and no defects  Knee has swelling 1+ effusion.                        Joint tenderness is present                        Patient is tender over the medial joint line  Lower Leg:  Has normal appearance and no tenderness or defects  Ankle:  Non-tender and no defects  Foot:  Non-tender and no defects Range of Motion:  Knee:  Range of motion is: 0-110                        Crepitus is  present  Ankle:  Range of motion is normal. Strength and Tone:  The left lower extremity has normal strength and tone. Stability:  Knee:  The knee is stable.  Ankle:  The ankle is  stable.   The patient has been educated about the nature of the problem(s) and counseled on treatment options.  The patient appeared to understand what I have discussed and is in agreement with it.  Encounter Diagnoses  Name Primary?  . Chronic right-sided low back pain with right-sided sciatica Yes  . Chronic pain of left knee   . Chronic left shoulder pain     PLAN Call if any problems.  Precautions discussed.  Continue current medications.   Return to clinic 3 weeks Begin PT in [place].   Electronically Signed Darreld McleanWayne Mariah Gerstenberger, MD 11/7/201710:40 AM

## 2016-01-19 ENCOUNTER — Encounter: Payer: Self-pay | Admitting: Family Medicine

## 2016-01-19 ENCOUNTER — Encounter: Payer: Self-pay | Admitting: Cardiology

## 2016-01-19 ENCOUNTER — Other Ambulatory Visit: Payer: Self-pay | Admitting: Nurse Practitioner

## 2016-01-20 ENCOUNTER — Encounter: Payer: Self-pay | Admitting: Surgery

## 2016-01-20 ENCOUNTER — Ambulatory Visit (INDEPENDENT_AMBULATORY_CARE_PROVIDER_SITE_OTHER): Payer: Self-pay | Admitting: Surgery

## 2016-01-20 ENCOUNTER — Encounter: Payer: Self-pay | Admitting: Cardiology

## 2016-01-20 ENCOUNTER — Telehealth: Payer: Self-pay

## 2016-01-20 VITALS — BP 181/98 | HR 66 | Temp 98.2°F | Ht 60.0 in | Wt 188.0 lb

## 2016-01-20 DIAGNOSIS — K5732 Diverticulitis of large intestine without perforation or abscess without bleeding: Secondary | ICD-10-CM

## 2016-01-20 NOTE — Patient Instructions (Addendum)
We will have you return to your cardiologist to obtain clearance prior to scheduling your surgery. Once this has been done, we will schedule your surgery if that is still what you wish to do. We will need to see you in the office within 30 days of the date of surgery.  Please follow-up as scheduled below.

## 2016-01-20 NOTE — Progress Notes (Signed)
Patient ID: Lisa [name], female   DOB: 25-Jul-1960, 55 y.o.   MRN: [id number]  HPI Lisa [name] is a 55 y.o. female seen for recurrent diverticulitis. Patient has had at least 6 attacks of diverticulitis or last few 6 years or so and with 3 different admissions to the hospital. Apparently she had first episode several years ago when she was in [place], at that time she required prolonged hospitalization and antibiotic therapy. An she has had recurrent episodes of diverticulitis that also have been managed as an outpatient. Most recent was less than 3 weeks ago. And she was managed with antibiotics which responded well. Per her history and what I can gather from the chart there is no evidence of complications such as abscess or fistula.  She does have a history of coronary artery disease and has been seen by Dr. Cindra Eves and her cardiologist and and in 2014 she had an abnormal stress test. She has been managed conservatively since then. Apparently she has some dyspnea and some chest pain on insertion but as patient is very and difficult to interrogate and 2. Her down. She is tangential and it is difficult to assess whether she is having true anginal pain or not. She does have a significant surgical history of a C-section in the remote past. She is taking aspirin but no other blood thinners. 3/ 2016 she did have a colonoscopy by Dr. Darrick Penna  showing significant diverticulosis and some hyperplastic polyps. Former smoker.  HPI  Past Medical History:  Diagnosis Date  . Allergy   . Anemia   . Arthritis   . Asthma   . Bursitis   . Chronic back pain    Patient reports lower back disc problems  . Degenerative lumbar disc   . Diverticulitis   . Enlarged uterus 03/18/2014  . Essential hypertension, benign   . GERD (gastroesophageal reflux disease)   . High blood pressure   . High cholesterol   . Mixed hyperlipidemia   . Mixed stress and urge urinary incontinence 03/18/2014  . Myocardial infarction     Patient reports related to "steroids" in 1985 ([place])  . PMB (postmenopausal bleeding) 03/18/2014  . Seizures (HCC)   . Stroke (HCC)   . Tendinitis    Patient reports chronic problem involving her arms and hands    Past Surgical History:  Procedure Laterality Date  . adnoids    . CESAREAN SECTION    . COLONOSCOPY N/A 06/04/2014   SLF: 1. moderate diverticulosis in teh sigmoid colon, descending colon, and transverse colon. 2. Two colon polyps removed 3. Large external and small internal hemorroids.   . TONSILLECTOMY      Family History  Problem Relation Age of Onset  . Adopted: Yes  . Other Mother     aneursym  . Other Maternal Uncle     stenosis  . Arthritis Maternal Uncle   . Angina Son   . ADD / ADHD Son   . Other Son     food allergies  . Blindness Daughter     from birth; brain damage  . Drug abuse Sister   . Heart attack Paternal Aunt   . Alcohol abuse Cousin   . Drug abuse Cousin     Social History Social History  Substance Use Topics  . Smoking status: Former Smoker    Packs/day: 2.00    Years: 12.00    Types: Cigarettes    Quit date: 03/13/1985  . Smokeless tobacco: Never Used  Comment: Quit x 10 years ago  . Alcohol use No    Allergies  Allergen Reactions  . Metoprolol Nausea And Vomiting    Dizziness Elevated BP  . Morphine And Related Other (See Comments)    Disoriented, vomiting and nausea  . Naproxen Other (See Comments)    Lightheadedness  . Tramadol Nausea And Vomiting  . Ibuprofen Rash  . Penicillins Rash    Current Outpatient Prescriptions  Medication Sig Dispense Refill  . albuterol (PROVENTIL HFA;VENTOLIN HFA) 108 (90 BASE) MCG/ACT inhaler Inhale 2 puffs into the lungs every 6 (six) hours as needed for wheezing.    Marland Kitchen. aspirin EC 81 MG tablet Take 81 mg by mouth daily.    . carvedilol (COREG) 6.25 MG tablet Take 1 tablet (6.25 mg total) by mouth 2 (two) times daily with a meal. 60 tablet 3  . CHARCOAL ACTIVATED PO Take 2 tablets  by mouth as needed.    . cholecalciferol (VITAMIN D) 1000 units tablet Take 1,000 Units by mouth daily.    . Cyanocobalamin (B-12 PO) Take 1 tablet by mouth daily.    Marland Kitchen. dexlansoprazole (DEXILANT) 60 MG capsule Take 1 capsule (60 mg total) by mouth daily. 20 capsule 0  . DimenhyDRINATE (EQ MOTION SICKNESS PO) Apply 1 application topically as needed (for nausea/vomiting). Reported on 08/25/2015    . linaclotide (LINZESS) 72 MCG capsule Take 1 capsule (72 mcg total) by mouth daily before breakfast. 30 capsule 11  . lisinopril (PRINIVIL,ZESTRIL) 10 MG tablet Take 1 tablet (10 mg total) by mouth daily. 90 tablet 3  . methylcellulose (FIBER THERAPY) oral powder Take 1 packet by mouth 3 (three) times daily. 1 tsp daily in the morning     No current facility-administered medications for this visit.      Review of Systems A 10 point review of systems was asked and was negative except for the information on the HPI  Physical Exam Blood pressure (!) 181/98, pulse 66, temperature 98.2 F (36.8 C), temperature source Oral, height 5' (1.524 m), weight 85.3 kg (188 lb), last menstrual period 12/26/2011. CONSTITUTIONAL: NAD  EYES: Pupils are equal, round, and reactive to light, Sclera are non-icteric. EARS, NOSE, MOUTH AND THROAT: The oropharynx is clear. The oral mucosa is pink and moist. Hearing is intact to voice. LYMPH NODES:  Lymph nodes in the neck are normal. RESPIRATORY:  Lungs are clear. There is normal respiratory effort, with equal breath sounds bilaterally, and without pathologic use of accessory muscles. CARDIOVASCULAR: Heart is regular without murmurs, gallops, or rubs. GI: The abdomen is  soft, nontender, and nondistended. There are no palpable masses. There is no hepatosplenomegaly. There are normal bowel sounds in all quadrants. GU: Rectal deferred.   MUSCULOSKELETAL: Normal muscle strength and tone. No cyanosis or edema.   SKIN: Turgor is good and there are no pathologic skin lesions or  ulcers. NEUROLOGIC: Motor and sensation is grossly normal. Cranial nerves are grossly intact. PSYCH:  Oriented to person, place and time. Affect is normal.  Data Reviewed  I have personally reviewed the patient's imaging, laboratory findings and medical records.    Assessment Plan 55 yo female with multiple recurrent episodes of uncomplicated diverticulitis. She is interested in sigmoid colectomy given that she has significant pain and is tired of taking antibiotics to control her diverticulitis. She does have a significant history of coronary artery disease with an abnormal stress test that needs to be further evaluated before elective sigmoid colectomy is performed. We will refer her  back to Dr. Cindra EvesMacdowell from cardiology for cardiac optimization preoperatively. Currently she does have significant hypertension that may benefit from further treatment. No need for emergent surgical intervention. We did discuss that he was a reasonable alternative to have a planned sigmoid colectomy. Procedure discussed with the patient in detail as well as proposed length of the procedure as well as hospital stay. Risk, benefits and possible complications discussed with the patient. As I stated above first order of business is to make sure that her heart is healthy enough to tolerate a major abdominal operation. I will see her back and in a few weeks and where I can review her cardiology evaluation and further discuss planned operative intervention. Extensive counseling provided   Sterling Bigiego Pabon, MD FACS General Surgeon 01/20/2016, 12:56 PM

## 2016-01-20 NOTE — Telephone Encounter (Signed)
Called pt. To clarify emails sent via My Chart. Patient wanted to be sure her PCP ( Dr. Ermalinda MemosBradshaw ) was able to see all of her cardiac  Tests. I let pt. know that he is able to view her information. Pt. thanked me for getting back with her.

## 2016-01-21 ENCOUNTER — Encounter: Payer: Self-pay | Admitting: Family Medicine

## 2016-01-21 ENCOUNTER — Encounter: Payer: Self-pay | Admitting: Cardiology

## 2016-01-21 MED ORDER — DEXLANSOPRAZOLE 60 MG PO CPDR: 60.0000 mg | DELAYED_RELEASE_CAPSULE | Freq: Every day | ORAL | 3 refills | Status: AC

## 2016-01-21 NOTE — Telephone Encounter (Signed)
Tried to call health department. They are not open today.

## 2016-01-24 ENCOUNTER — Encounter: Payer: Self-pay | Admitting: Family Medicine

## 2016-01-24 ENCOUNTER — Ambulatory Visit (INDEPENDENT_AMBULATORY_CARE_PROVIDER_SITE_OTHER): Payer: Self-pay | Admitting: Family Medicine

## 2016-01-24 VITALS — BP 173/98 | HR 58 | Temp 97.1°F | Ht 61.52 in | Wt 186.2 lb

## 2016-01-24 DIAGNOSIS — I10 Essential (primary) hypertension: Secondary | ICD-10-CM

## 2016-01-24 MED ORDER — HYDROCHLOROTHIAZIDE 25 MG PO TABS: 25.0000 mg | ORAL_TABLET | Freq: Every day | ORAL | 3 refills | Status: AC

## 2016-01-24 NOTE — Patient Instructions (Signed)
Great to see you!  Stop lisinopril, start HCTZ, come back in 6 weeks or so for BP recheck and labs.

## 2016-01-24 NOTE — Progress Notes (Signed)
   HPI  Patient presents today to follow-up for hypertension.  Patient is very preoccupied with concerns about mistaken identity. She states there is someone very similar to her appearance with a similar name that lives in [place], do history of drug abuse. She states that she's had her mistaken identity several times at the local hospital. She requests placing a picture in the chart to identify her by her right ankle scar.  Hypertension Patient feels lisinopril causing chronic dry cough. She would like to transition to another medication, she does not have insurance so she needs an inexpensive medicine. She is still taking Coreg.   PMH: Smoking status noted ROS: Per HPI  Objective: BP (!) 173/98   Pulse (!) 58   Temp 97.1 F (36.2 C) (Oral)   Ht 5' 1.52" (1.563 m)   Wt 186 lb 3.2 oz (84.5 kg)   LMP 12/26/2011   BMI 34.59 kg/m  Gen: NAD, alert, cooperative with exam HEENT: NCAT CV: RRR, good S1/S2, no murmur Resp: CTABL, no wheezes, non-labored Ext: No edema, warm Neuro: Alert and oriented  R ankle scar       Assessment and plan:  # Hypertension Uncontrolled, chronic cough from lisinopril Stop lisinopril, start HCTZ Normally with heart disease I would start an ARB instead, however given her financial limitations currently I used HCTZ instead. Continue beta blocker Follow-up 6 weeks for repeat labs in follow-up hypertension  Pt very pre-occupied with concern about [place]. Picture placed in chart as requested.   Meds ordered this encounter  Medications  . hydrochlorothiazide (HYDRODIURIL) 25 MG tablet    Sig: Take 1 tablet (25 mg total) by mouth daily.    Dispense:  90 tablet    Refill:  3    Murtis SinkSam Abdur Hoglund, MD [facility] 01/24/2016, 10:56 AM

## 2016-01-24 NOTE — Telephone Encounter (Signed)
Spoke with Blima SingerSonja Gunn [facility]. She said she needs a new rx and put on it there is a change in dosage and she will take care of it. We need to mail it to [phone], [place] [phone]

## 2016-01-24 NOTE — Telephone Encounter (Signed)
Discussed at visit today.   Murtis SinkSam Bradshaw, MD [facility] 01/24/2016, 5:21 PM

## 2016-01-25 MED ORDER — LINACLOTIDE 72 MCG PO CAPS: 72.0000 ug | ORAL_CAPSULE | Freq: Every day | ORAL | 11 refills | Status: DC

## 2016-01-25 NOTE — Addendum Note (Signed)
Addended by: Tiffany KocherLEWIS, Ethanael Veith S on: 01/25/2016 08:29 AM   Modules accepted: Orders

## 2016-01-25 NOTE — Telephone Encounter (Signed)
RX done. 

## 2016-01-26 NOTE — Telephone Encounter (Signed)
rx has been mailed to the [facility].

## 2016-02-01 ENCOUNTER — Ambulatory Visit: Payer: Self-pay | Admitting: Emergency Medicine

## 2016-02-01 ENCOUNTER — Encounter: Payer: Self-pay | Admitting: Gastroenterology

## 2016-02-01 ENCOUNTER — Encounter: Payer: Self-pay | Admitting: Family Medicine

## 2016-02-02 ENCOUNTER — Ambulatory Visit: Payer: Self-pay | Admitting: Surgery

## 2016-02-02 NOTE — Progress Notes (Deleted)
Cardiology Office Note  Date: 02/02/2016   ID: Lisa [name], DOB [birth year], MRN [id number]  PCP: Kevin FentonSamuel Bradshaw, MD  Primary Cardiologist: Nona DellSamuel Aidenn Skellenger, MD   No chief complaint on file.   History of Present Illness: Lisa [name] is a 55 y.o. female last seen in September. I reviewed the interval chart. She has been evaluated by Dr. Everlene FarrierPabon with history of recurrent diverticulitis and is being considered for possible sigmoid colectomy.  Details of her cardiac history are unclear as outlined below. She did undergo previous ischemic testing in 2014.  Past Medical History:  Diagnosis Date  . Allergy   . Anemia   . Arthritis   . Asthma   . Bursitis   . Chronic back pain    Patient reports lower back disc problems  . Degenerative lumbar disc   . Diverticulitis   . Enlarged uterus 03/18/2014  . Essential hypertension, benign   . GERD (gastroesophageal reflux disease)   . High blood pressure   . High cholesterol   . Mixed hyperlipidemia   . Mixed stress and urge urinary incontinence 03/18/2014  . Myocardial infarction    Patient [place]  . PMB (postmenopausal bleeding) 03/18/2014  . Seizures (HCC)   . Stroke (HCC)   . Tendinitis    Patient reports chronic problem involving her arms and hands    Past Surgical History:  Procedure Laterality Date  . adnoids    . CESAREAN SECTION    . COLONOSCOPY N/A 06/04/2014   SLF: 1. moderate diverticulosis in teh sigmoid colon, descending colon, and transverse colon. 2. Two colon polyps removed 3. Large external and small internal hemorroids.   . TONSILLECTOMY      Current Outpatient Prescriptions  Medication Sig Dispense Refill  . albuterol (PROVENTIL HFA;VENTOLIN HFA) 108 (90 BASE) MCG/ACT inhaler Inhale 2 puffs into the lungs every 6 (six) hours as needed for wheezing.    Marland Kitchen. aspirin EC 81 MG tablet Take 81 mg by mouth daily.    . carvedilol (COREG) 6.25 MG tablet Take 1 tablet (6.25 mg  total) by mouth 2 (two) times daily with a meal. 60 tablet 3  . CHARCOAL ACTIVATED PO Take 2 tablets by mouth as needed.    . cholecalciferol (VITAMIN D) 1000 units tablet Take 1,000 Units by mouth daily.    . Cyanocobalamin (B-12 PO) Take 1 tablet by mouth daily.    Marland Kitchen. dexlansoprazole (DEXILANT) 60 MG capsule Take 1 capsule (60 mg total) by mouth daily. 90 capsule 3  . DimenhyDRINATE (EQ MOTION SICKNESS PO) Apply 1 application topically as needed (for nausea/vomiting). Reported on 08/25/2015    . hydrochlorothiazide (HYDRODIURIL) 25 MG tablet Take 1 tablet (25 mg total) by mouth daily. 90 tablet 3  . linaclotide (LINZESS) 72 MCG capsule Take 1 capsule (72 mcg total) by mouth daily before breakfast. PLEASE NOTE THIS IS A CHANGE IN DOSAGE. 30 capsule 11  . methylcellulose (FIBER THERAPY) oral powder Take 1 packet by mouth 3 (three) times daily. 1 tsp daily in the morning     No current facility-administered medications for this visit.    Allergies:  Metoprolol; Morphine and related; Naproxen; Tramadol; Ibuprofen; and Penicillins   Social History: The patient  reports that she quit smoking about 30 years ago. Her smoking use included Cigarettes. She has a 24.00 pack-year smoking history. She has never used smokeless tobacco. She reports that she does not drink alcohol or use drugs.  Family History: The patient's family history includes ADD / ADHD in her son; Alcohol abuse in her cousin; Angina in her son; Arthritis in her maternal uncle; Blindness in her daughter; Drug abuse in her cousin and sister; Heart attack in her paternal aunt; Other in her maternal uncle, mother, and son. She was adopted.   ROS:  Please see the history of present illness. Otherwise, complete review of systems is positive for {NONE DEFAULTED:18576::"none"}.  All other systems are reviewed and negative.   Physical Exam: VS:  LMP 12/26/2011 , BMI There is no height or weight on file to calculate BMI.  Wt Readings from Last 3  Encounters:  01/24/16 186 lb 3.2 oz (84.5 kg)  01/20/16 188 lb (85.3 kg)  01/18/16 186 lb (84.4 kg)    Obese woman, appears comfortable at rest.  HEENT: Conjunctiva and lids normal, oropharynx clear.  Neck: Supple, no elevated JVP or carotid bruits, no thyromegaly.  Lungs: Clear to auscultation, nonlabored breathing at rest.  Cardiac: Regular rate and rhythm, no S3 or significant systolic murmur, no pericardial rub.  Abdomen: Soft, nontender, bowel sounds present, no guarding or rebound.  Extremities: Trace ankle edema, distal pulses 1-2+.  Skin: Warm and dry. Musculoskeletal: No kyphosis. Neuropsychiatric: Alert and oriented 3, affect appropriate.  ECG: I personally reviewed the tracing from 11/18/2015 which showed sinus bradycardia with nonspecific T-wave changes and borderline low voltage.  Recent Labwork: 03/17/2015: ALT 35; AST 22 07/14/2015: Magnesium 1.7 07/15/2015: Hemoglobin 12.0; Platelets 251 12/21/2015: BUN 11; Creatinine, Ser 0.66; Potassium 4.1; Sodium 140     Component Value Date/Time   CHOL 179 03/17/2015 1339   TRIG 60 03/17/2015 1339   HDL 68 03/17/2015 1339   CHOLHDL 2.6 03/17/2015 1339   LDLCALC 99 03/17/2015 1339    Other Studies Reviewed Today:  Dobutamine Myoview 12/20/2012: IMPRESSION: Abnormal dobutamine MPI. Small area of myocardium at jeopardy in the RCA territory. LVEF 71%. Overall low risk study for major cardiac events.  Echocardiogram 12/20/2012: Study Conclusions  - Left ventricle: The cavity size was normal. There was mild concentric hypertrophy. Systolic function was vigorous. The estimated ejection fraction was in the range of 65% to 70%. Wall motion was normal; there were no regional wall motion abnormalities. Doppler parameters are consistent with abnormal left ventricular relaxation (grade 1 diastolic dysfunction). - Aortic valve: Poorly visualized. Mildly calcified annulus. Probably trileaflet. No significant  regurgitation. - Right atrium: Central venous pressure: 8mm Hg (est). - Atrial septum: No defect or patent foramen ovale was identified. - Tricuspid valve: Trivial regurgitation. - Pulmonary arteries: Systolic pressure could not be accurately estimated. - Pericardium, extracardiac: There was no pericardial effusion. Impressions:  - No prior study for comparison. Mild LVH with LVEF 65-70%, grade 1 diastolic dysfunction. Trivial tricuspid regurgitation. Unable to calculate PASP, CVP 8 mm mercury. No pericardial effusion.  Assessment and Plan:   Current medicines were reviewed with the patient today.  No orders of the defined types were placed in this encounter.   Disposition:  Signed, Jonelle SidleSamuel G. Theador Jezewski, MD, [facility] 02/02/2016 12:59 PM    [facility] [address], [place], [place] [address] Phone: [phone]; Fax: [phone]

## 2016-02-08 ENCOUNTER — Ambulatory Visit: Payer: Self-pay | Admitting: Cardiology

## 2016-02-08 ENCOUNTER — Encounter: Payer: Self-pay | Admitting: Gastroenterology

## 2016-02-10 ENCOUNTER — Encounter: Payer: Self-pay | Admitting: Cardiology

## 2016-02-14 ENCOUNTER — Encounter: Payer: Self-pay | Admitting: Orthopaedic Surgery

## 2016-02-15 ENCOUNTER — Ambulatory Visit: Payer: Self-pay | Admitting: Orthopaedic Surgery

## 2016-02-25 ENCOUNTER — Encounter: Payer: Self-pay | Admitting: Cardiology

## 2016-02-29 ENCOUNTER — Ambulatory Visit: Payer: Self-pay | Admitting: Cardiology

## 2016-03-02 ENCOUNTER — Encounter: Payer: Self-pay | Admitting: Orthopaedic Surgery

## 2016-03-02 ENCOUNTER — Encounter: Payer: Self-pay | Admitting: Acute Care

## 2016-03-03 ENCOUNTER — Ambulatory Visit: Payer: Self-pay | Admitting: Cardiology

## 2016-03-09 ENCOUNTER — Encounter: Payer: Self-pay | Admitting: Orthopaedic Surgery

## 2016-03-15 ENCOUNTER — Ambulatory Visit (INDEPENDENT_AMBULATORY_CARE_PROVIDER_SITE_OTHER): Payer: Self-pay | Admitting: Family Medicine

## 2016-03-15 ENCOUNTER — Encounter: Payer: Self-pay | Admitting: Family Medicine

## 2016-03-15 VITALS — BP 158/79 | HR 65 | Temp 96.7°F | Ht 61.52 in | Wt 194.6 lb

## 2016-03-15 DIAGNOSIS — I10 Essential (primary) hypertension: Secondary | ICD-10-CM

## 2016-03-15 DIAGNOSIS — R7303 Prediabetes: Secondary | ICD-10-CM

## 2016-03-15 DIAGNOSIS — J189 Pneumonia, unspecified organism: Secondary | ICD-10-CM

## 2016-03-15 DIAGNOSIS — J181 Lobar pneumonia, unspecified organism: Secondary | ICD-10-CM

## 2016-03-15 MED ORDER — CARVEDILOL 6.25 MG PO TABS: 6.2500 mg | ORAL_TABLET | Freq: Two times a day (BID) | ORAL | 3 refills | Status: AC

## 2016-03-15 MED ORDER — AZITHROMYCIN 250 MG PO TABS: ORAL_TABLET | ORAL | 0 refills | Status: DC

## 2016-03-15 NOTE — Patient Instructions (Signed)
Great to see you!  Take all of the antibiotics  Try to get extra rest for a few days   Community-Acquired Pneumonia, Adult Pneumonia is an infection of the lungs. There are different types of pneumonia. One type can develop while a person is in a hospital. A different type, called community-acquired pneumonia, develops in people who are not, or have not recently been, in the hospital or other health care facility. What are the causes? Pneumonia may be caused by bacteria, viruses, or funguses. Community-acquired pneumonia is often caused by Streptococcus pneumonia bacteria. These bacteria are often passed from one person to another by breathing in droplets from the cough or sneeze of an infected person. What increases the risk? The condition is more likely to develop in:  People who havechronic diseases, such as chronic obstructive pulmonary disease (COPD), asthma, congestive heart failure, cystic fibrosis, diabetes, or kidney disease.  People who haveearly-stage or late-stage HIV.  People who havesickle cell disease.  People who havehad their spleen removed (splenectomy).  People who havepoor Administratordental hygiene.  People who havemedical conditions that increase the risk of breathing in (aspirating) secretions their own mouth and nose.  People who havea weakened immune system (immunocompromised).  People who smoke.  People whotravel to areas where pneumonia-causing germs commonly exist.  People whoare around animal habitats or animals that have pneumonia-causing germs, including birds, bats, rabbits, cats, and farm animals. What are the signs or symptoms? Symptoms of this condition include:  Adry cough.  A wet (productive) cough.  Fever.  Sweating.  Chest pain, especially when breathing deeply or coughing.  Rapid breathing or difficulty breathing.  Shortness of breath.  Shaking chills.  Fatigue.  Muscle aches. How is this diagnosed? Your health care  provider will take a medical history and perform a physical exam. You may also have other tests, including:  Imaging studies of your chest, including X-rays.  Tests to check your blood oxygen level and other blood gases.  Other tests on blood, mucus (sputum), fluid around your lungs (pleural fluid), and urine. If your pneumonia is severe, other tests may be done to identify the specific cause of your illness. How is this treated? The type of treatment that you receive depends on many factors, such as the cause of your pneumonia, the medicines you take, and other medical conditions that you have. For most adults, treatment and recovery from pneumonia may occur at home. In some cases, treatment must happen in a hospital. Treatment may include:  Antibiotic medicines, if the pneumonia was caused by bacteria.  Antiviral medicines, if the pneumonia was caused by a virus.  Medicines that are given by mouth or through an IV tube.  Oxygen.  Respiratory therapy. Although rare, treating severe pneumonia may include:  Mechanical ventilation. This is done if you are not breathing well on your own and you cannot maintain a safe blood oxygen level.  Thoracentesis. This procedureremoves fluid around one lung or both lungs to help you breathe better. Follow these instructions at home:  Take over-the-counter and prescription medicines only as told by your health care provider.  Only takecough medicine if you are losing sleep. Understand that cough medicine can prevent your body's natural ability to remove mucus from your lungs.  If you were prescribed an antibiotic medicine, take it as told by your health care provider. Do not stop taking the antibiotic even if you start to feel better.  Sleep in a semi-upright position at night. Try sleeping in a reclining chair,  or place a few pillows under your head.  Do not use tobacco products, including cigarettes, chewing tobacco, and e-cigarettes. If you  need help quitting, ask your health care provider.  Drink enough water to keep your urine clear or pale yellow. This will help to thin out mucus secretions in your lungs. How is this prevented? There are ways that you can decrease your risk of developing community-acquired pneumonia. Consider getting a pneumococcal vaccine if:  You are older than 57 years of age.  You are older than 57 years of age and are undergoing cancer treatment, have chronic lung disease, or have other medical conditions that affect your immune system. Ask your health care provider if this applies to you. There are different types and schedules of pneumococcal vaccines. Ask your health care provider which vaccination option is best for you. You may also prevent community-acquired pneumonia if you take these actions:  Get an influenza vaccine every year. Ask your health care provider which type of influenza vaccine is best for you.  Go to the dentist on a regular basis.  Wash your hands often. Use hand sanitizer if soap and water are not available. Contact a health care provider if:  You have a fever.  You are losing sleep because you cannot control your cough with cough medicine. Get help right away if:  You have worsening shortness of breath.  You have increased chest pain.  Your sickness becomes worse, especially if you are an older adult or have a weakened immune system.  You cough up blood. This information is not intended to replace advice given to you by your health care provider. Make sure you discuss any questions you have with your health care provider. Document Released: 02/27/2005 Document Revised: 07/08/2015 Document Reviewed: 06/24/2014 Elsevier Interactive Patient Education  2017 [employer]

## 2016-03-15 NOTE — Progress Notes (Signed)
   HPI  Patient presents today for follow-up hypertension and prediabetes, also with cough.  Cough Persistent for about one month, also with fatigue and malaise. No fevers. Tolerating food and fluids normally.  Hypertension Tolerating HCTZ and Coreg very well, no side effects Patient requesting blood pressure log today, she is resistant to starting additional medications. She would like to start eating more garlic and feels this will help her blood pressure.  Prediabetes. Diet has recently worsened with increase carbohydrate intake. Patient also understands that this is likely adding to her weight gain. Patient is motivated to make changes.  PMH: Smoking status noted ROS: Per HPI  Objective: BP (!) 158/79   Pulse 65   Temp (!) 96.7 F (35.9 C) (Oral)   Ht 5' 1.52" (1.563 m)   Wt 194 lb 9.6 oz (88.3 kg)   LMP 12/26/2011   BMI 36.15 kg/m  Gen: NAD, alert, cooperative with exam HEENT: NCAT CV: RRR, good S1/S2, no murmur Resp: Nonlabored, persistent rhonchorous sounds in the right upper lung field. Ext: No edema, warm Neuro: Alert and oriented, No gross deficits  Assessment and plan:  # Cough- CAP Treat with azithromycin. Patient's exam is very reassuring, however she has 3-4 weeks of cough with malaise. Her lung exam is consistent with pneumonia.  # Hypertension Uncontrolled, offered additional medications, however patient would like to seek homeopathic treatments first. Continue beta blocker plus HCTZ  # Prediabetes Discussed therapeutic lifestyle changes with patient, she is agreeable to making changes. Plan A1c every 6 months       Meds ordered this encounter  Medications  . linaclotide (LINZESS) 145 MCG CAPS capsule    Sig: Take 145 mcg by mouth daily before breakfast.  . carvedilol (COREG) 6.25 MG tablet    Sig: Take 1 tablet (6.25 mg total) by mouth 2 (two) times daily with a meal.    Dispense:  60 tablet    Refill:  3  . azithromycin (ZITHROMAX) 250  MG tablet    Sig: Take 2 tablets on day 1 and 1 tablet daily after that    Dispense:  6 tablet    Refill:  0    Murtis SinkSam Chong January, MD [facility] 03/15/2016, 11:37 AM

## 2016-03-16 ENCOUNTER — Encounter: Payer: Self-pay | Admitting: Orthopaedic Surgery

## 2016-03-16 ENCOUNTER — Ambulatory Visit: Payer: Self-pay | Admitting: Emergency Medicine

## 2016-03-16 ENCOUNTER — Ambulatory Visit (INDEPENDENT_AMBULATORY_CARE_PROVIDER_SITE_OTHER): Payer: Self-pay | Admitting: Orthopaedic Surgery

## 2016-03-16 VITALS — BP 152/99 | HR 68 | Temp 97.5°F | Ht 61.0 in | Wt 193.0 lb

## 2016-03-16 DIAGNOSIS — M25562 Pain in left knee: Secondary | ICD-10-CM

## 2016-03-16 DIAGNOSIS — M5441 Lumbago with sciatica, right side: Secondary | ICD-10-CM

## 2016-03-16 DIAGNOSIS — M25512 Pain in left shoulder: Secondary | ICD-10-CM

## 2016-03-16 DIAGNOSIS — G8929 Other chronic pain: Secondary | ICD-10-CM

## 2016-03-16 NOTE — Progress Notes (Signed)
Patient VW:UJWJXBJ:Lisa [name], female DOB:Sep 11, 1960, 56 y.o. [id number]  Chief Complaint  Patient presents with  . Follow-up    LBP, right shoulder and left knee    HPI  Lisa [name] is a 56 y.o. female who has multiple joint pains of the lower back, the knee more on the left and the left shoulder and both arms, more of the volar arm on the right.  She has no trauma.  She has some pain after typing and using keyboard on computer.  She has less back pain and has found stretching exercises work well.  She has not gone to PT yet for financial reasons.  She has no redness, no weakness.    I have told her to check her computer height and keyboard height related to her chair and position.  I told her to have her husband take a picture of her seated at computer and see if the positioning could be improved.  Use a support for the elbows would be helpful. HPI  Body mass index is 36.47 kg/m.  ROS  Review of Systems  HENT: Negative for congestion.   Respiratory: Positive for shortness of breath. Negative for cough.   Cardiovascular: Positive for chest pain. Negative for leg swelling.  Endocrine: Positive for cold intolerance.  Musculoskeletal: Positive for arthralgias and back pain.  Allergic/Immunologic: Positive for environmental allergies.  Psychiatric/Behavioral: The patient is nervous/anxious.     Past Medical History:  Diagnosis Date  . Allergy   . Anemia   . Arthritis   . Asthma   . Bursitis   . Chronic back pain    Patient reports lower back disc problems  . Degenerative lumbar disc   . Diverticulitis   . Enlarged uterus 03/18/2014  . Essential hypertension, benign   . GERD (gastroesophageal reflux disease)   . High blood pressure   . High cholesterol   . Mixed hyperlipidemia   . Mixed stress and urge urinary incontinence 03/18/2014  . Myocardial infarction    Patient reports related [place]" [place] ([place])  . PMB (postmenopausal bleeding) 03/18/2014  . Seizures  (HCC)   . Stroke (HCC)   . Tendinitis    Patient reports chronic problem involving her arms and hands    Past Surgical History:  Procedure Laterality Date  . adnoids    . CESAREAN SECTION    . COLONOSCOPY N/A 06/04/2014   SLF: 1. moderate diverticulosis in teh sigmoid colon, descending colon, and transverse colon. 2. Two colon polyps removed 3. Large external and small internal hemorroids.   . TONSILLECTOMY      Family History  Problem Relation Age of Onset  . Adopted: Yes  . Other Mother     aneursym  . Other Maternal Uncle     stenosis  . Arthritis Maternal Uncle   . Angina Son   . ADD / ADHD Son   . Other Son     food allergies  . Blindness Daughter     from birth; brain damage  . Drug abuse Sister   . Heart attack Paternal Aunt   . Alcohol abuse Cousin   . Drug abuse Cousin     Social History Social History  Substance Use Topics  . Smoking status: Former Smoker    Packs/day: 2.00    Years: 12.00    Types: Cigarettes    Quit date: 03/13/1985  . Smokeless tobacco: Never Used     Comment: Quit x 10 years ago  . Alcohol  use No    Allergies  Allergen Reactions  . Metoprolol Nausea And Vomiting    Dizziness Elevated BP  . Morphine And Related Other (See Comments)    Disoriented, vomiting and nausea  . Naproxen Other (See Comments)    Lightheadedness  . Tramadol Nausea And Vomiting  . Ibuprofen Rash  . Penicillins Rash    Current Outpatient Prescriptions  Medication Sig Dispense Refill  . albuterol (PROVENTIL HFA;VENTOLIN HFA) 108 (90 BASE) MCG/ACT inhaler Inhale 2 puffs into the lungs every 6 (six) hours as needed for wheezing.    Marland Kitchen aspirin EC 81 MG tablet Take 81 mg by mouth daily.    Marland Kitchen azithromycin (ZITHROMAX) 250 MG tablet Take 2 tablets on day 1 and 1 tablet daily after that 6 tablet 0  . carvedilol (COREG) 6.25 MG tablet Take 1 tablet (6.25 mg total) by mouth 2 (two) times daily with a meal. 60 tablet 3  . CHARCOAL ACTIVATED PO Take 2 tablets by  mouth as needed.    . cholecalciferol (VITAMIN D) 1000 units tablet Take 1,000 Units by mouth daily.    . Cyanocobalamin (B-12 PO) Take 1 tablet by mouth daily.    Marland Kitchen dexlansoprazole (DEXILANT) 60 MG capsule Take 1 capsule (60 mg total) by mouth daily. 90 capsule 3  . DimenhyDRINATE (EQ MOTION SICKNESS PO) Apply 1 application topically as needed (for nausea/vomiting). Reported on 08/25/2015    . hydrochlorothiazide (HYDRODIURIL) 25 MG tablet Take 1 tablet (25 mg total) by mouth daily. 90 tablet 3  . linaclotide (LINZESS) 145 MCG CAPS capsule Take 145 mcg by mouth daily before breakfast.    . methylcellulose (FIBER THERAPY) oral powder Take 1 packet by mouth 3 (three) times daily. 1 tsp daily in the morning     No current facility-administered medications for this visit.      Physical Exam  Blood pressure (!) 152/99, pulse 68, temperature 97.5 F (36.4 C), height 5\' 1"  (1.549 m), weight 193 lb (87.5 kg), last menstrual period 12/26/2011.  Constitutional: overall normal hygiene, normal nutrition, well developed, normal grooming, normal body habitus. Assistive device:none  Musculoskeletal: gait and station Limp none, muscle tone and strength are normal, no tremors or atrophy is present.  .  Neurological: coordination overall normal.  Deep tendon reflex/nerve stretch intact.  Sensation normal.  Cranial nerves II-XII intact.   Skin:   Normal overall no scars, lesions, ulcers or rashes. No psoriasis.  Psychiatric: Alert and oriented x 3.  Recent memory intact, remote memory unclear.  Normal mood and affect. Well groomed.  Good eye contact.  Cardiovascular: overall no swelling, no varicosities, no edema bilaterally, normal temperatures of the legs and arms, no clubbing, cyanosis and good capillary refill.  Lymphatic: palpation is normal.  Spine/Pelvis examination:  Inspection:  Overall, sacoiliac joint benign and hips nontender; without crepitus or defects.   Thoracic spine inspection:  Alignment normal without kyphosis present   Lumbar spine inspection:  Alignment  with normal lumbar lordosis, without scoliosis apparent.   Thoracic spine palpation:  without tenderness of spinal processes   Lumbar spine palpation: with tenderness of lumbar area; without tightness of lumbar muscles    Range of Motion:   Lumbar flexion, forward flexion is 40 without pain or tenderness    Lumbar extension is 10 without pain or tenderness   Left lateral bend is Normal  without pain or tenderness   Right lateral bend is Normal without pain or tenderness   Straight leg raising is Normal  Strength & tone: Normal   Stability overall normal stability   The left lower extremity is examined:  Inspection:  Thigh:  Non-tender and no defects  Knee has swelling 1+ effusion.                        Joint tenderness is present                        Patient is tender over the medial joint line  Lower Leg:  Has normal appearance and no tenderness or defects  Ankle:  Non-tender and no defects  Foot:  Non-tender and no defects Range of Motion:  Knee:  Range of motion is: 0-105                        Crepitus is  present  Ankle:  Range of motion is normal. Strength and Tone:  The left lower extremity has normal strength and tone. Stability:  Knee:  The knee is stable.  Ankle:  The ankle is stable.  She has tenderness of the volar forearm on the right and some tightness of the tendons.  NV intact. No swelling or redness is present.    The patient has been educated about the nature of the problem(s) and counseled on treatment options.  The patient appeared to understand what I have discussed and is in agreement with it.  Encounter Diagnoses  Name Primary?  . Chronic right-sided low back pain with right-sided sciatica Yes  . Chronic pain of left knee   . Chronic left shoulder pain     PLAN Call if any problems.  Precautions discussed.  Continue current medications.   Return to clinic 2  months She will try to go to PT later this month or next month.  Electronically Signed Darreld Mclean, MD 1/4/20182:08 PM

## 2016-03-20 NOTE — Progress Notes (Signed)
Cardiology Office Note  Date: 03/21/2016   ID: Lisa [name], DOB [birth year], MRN [id number]  PCP: Lisa Fenton, MD  Primary Cardiologist: Lisa Dell, MD   Chief Complaint  Patient presents with  . Cardiac follow-up  . Preoperative evaluation    History of Present Illness: Lisa [name] is a 56 y.o. female last seen in September 2017. She presents today for a follow-up evaluation. From a cardiac perspective she denies any angina symptoms and reports NYHA class II dyspnea with typical activities around the house. She has been doing some stretching exercises. Walking has been somewhat limited by [name] but she does plan to do physical therapy for this. Aerography  She has had evaluation by Dr. Sterling [name] regarding diverticulitis with potential plan for a sigmoid colectomy, although surgery is not scheduled. Patient states she has had long-standing problems with recurrent diverticulitis, details are not clear. She has seen Dr. Darrick [name] here in [place].  Last ischemic testing was in 2014 as outlined below. The details of her cardiac history are somewhat vague. She states that she had some type of cardiac event in [place] years ago associated with "steroids," but whether or not she has any actual obstructive CAD is not clear. Stress testing in 2014 suggested a small area of ischemia in the RCA distribution however.  Reviewed her medications. She continues on aspirin and Coreg. Has regular follow-up with PCP Dr. Ermalinda [name].  Past Medical History:  Diagnosis Date  . Allergy   . Anemia   . Arthritis   . Asthma   . Bursitis   . Chronic back [name]    Patient reports lower back disc problems  . Degenerative lumbar disc   . Diverticulitis   . Enlarged uterus 03/18/2014  . Essential hypertension, benign   . GERD (gastroesophageal reflux disease)   . High blood pressure   . High cholesterol   . Mixed hyperlipidemia   . Mixed stress and urge urinary incontinence 03/18/2014  .  Myocardial infarction    Patient reports related to "steroids" in 1985 ([place])  . PMB (postmenopausal bleeding) 03/18/2014  . Seizures (HCC)   . Stroke (HCC)   . Tendinitis    Patient reports chronic problem involving her arms and hands    Past Surgical History:  Procedure Laterality Date  . adnoids    . CESAREAN SECTION    . COLONOSCOPY N/A 06/04/2014   SLF: 1. moderate diverticulosis in teh sigmoid colon, descending colon, and transverse colon. 2. Two colon polyps removed 3. Large external and small internal hemorroids.   . TONSILLECTOMY      Current Outpatient Prescriptions  Medication Sig Dispense Refill  . albuterol (PROVENTIL HFA;VENTOLIN HFA) 108 (90 BASE) MCG/ACT inhaler Inhale 2 puffs into the lungs every 6 (six) hours as needed for wheezing.    Marland Kitchen aspirin EC 81 MG tablet Take 81 mg by mouth daily.    Marland Kitchen azithromycin (ZITHROMAX) 250 MG tablet Take 2 tablets on day 1 and 1 tablet daily after that 6 tablet 0  . carvedilol (COREG) 6.25 MG tablet Take 1 tablet (6.25 mg total) by mouth 2 (two) times daily with a meal. 60 tablet 3  . CHARCOAL ACTIVATED PO Take 2 tablets by mouth as needed.    . cholecalciferol (VITAMIN D) 1000 units tablet Take 1,000 Units by mouth daily.    . Cyanocobalamin (B-12 PO) Take 1 tablet by mouth daily.    Marland Kitchen dexlansoprazole (DEXILANT) 60 MG capsule Take 1 capsule (60 mg  total) by mouth daily. 90 capsule 3  . DimenhyDRINATE (EQ MOTION SICKNESS PO) Apply 1 application topically as needed (for nausea/vomiting). Reported on 08/25/2015    . hydrochlorothiazide (HYDRODIURIL) 25 MG tablet Take 1 tablet (25 mg total) by mouth daily. 90 tablet 3  . linaclotide (LINZESS) 145 MCG CAPS capsule Take 145 mcg by mouth daily before breakfast.    . methylcellulose (FIBER THERAPY) oral powder Take 1 packet by mouth 3 (three) times daily. 1 tsp daily in the morning     No current facility-administered medications for this visit.    Allergies:  Metoprolol; Morphine and  related; Naproxen; Tramadol; Ibuprofen; and Penicillins   Social History: The patient  reports that she quit smoking about 31 years ago. Her smoking use included Cigarettes. She has a 24.00 pack-year smoking history. She has never used smokeless tobacco. She reports that she does not drink alcohol or use drugs.   ROS:  Please see the history of present illness. Otherwise, complete review of systems is positive for intermittent [name], reflux.  All other systems are reviewed and negative.   Physical Exam: VS:  BP 130/78   Pulse 71   Ht 5' (1.524 m)   Wt 191 lb (86.6 kg)   LMP 12/26/2011   SpO2 98%   BMI 37.30 kg/m , BMI Body mass index is 37.3 kg/m.  Wt Readings from Last 3 Encounters:  03/21/16 191 lb (86.6 kg)  03/16/16 193 lb (87.5 kg)  03/15/16 194 lb 9.6 oz (88.3 kg)    Obese woman, appears comfortable at rest.  HEENT: Conjunctiva and lids normal, oropharynx clear.  Neck: Supple, no elevated JVP or carotid bruits, no thyromegaly.  Lungs: Clear to auscultation, nonlabored breathing at rest.  Cardiac: Regular rate and rhythm, no S3 or significant systolic murmur, no pericardial rub.  Abdomen: Soft, nontender, bowel sounds present, no guarding or rebound.  Extremities: Trace ankle edema, distal pulses 1-2+.  Skin: Warm and dry. Musculoskeletal: No kyphosis. Neuropsychiatric: Alert and oriented 3, affect appropriate.  ECG: I personally reviewed the tracing from 11/18/2015 which showed sinus bradycardia with nonspecific ST/T-wave changes.  Recent Labwork: 07/14/2015: Magnesium 1.7 07/15/2015: Hemoglobin 12.0; Platelets 251 12/21/2015: BUN 11; Creatinine, Ser 0.66; Potassium 4.1; Sodium 140     Component Value Date/Time   CHOL 179 03/17/2015 1339   TRIG 60 03/17/2015 1339   HDL 68 03/17/2015 1339   CHOLHDL 2.6 03/17/2015 1339   LDLCALC 99 03/17/2015 1339   Other Studies Reviewed Today:  Dobutamine Cardiolite September 2014: No diagnostic ST segment changes, small  area of ischemia in the RCA distribution, LVEF 71%, overall low risk.   Echocardiogram September 2014: Mild LVH with LVEF 65-70%, grade 1 diastolic dysfunction, trivial tricuspid regurgitation, unable to calculate PASP, CVP 8 mm mercury, no pericardial effusion.  Assessment and Plan:  1. Preoperative evaluation of 56 year old woman with reported ischemic heart disease, stress testing in 2014 revealed a small region of ischemia in the RCA distribution that has been managed medically. She is not reporting any active angina and has NYHA class II dyspnea with typical activities. Recent ECG reviewed. Blood pressure is better controlled today. She is on Coreg and HCTZ. She is being considered potentially for a sigmoid colectomy, although not yet scheduled and she has not had follow-up yet with her surgeon since visit in November 2017. We have discussed obtaining a follow-up stress test to reassess ischemic burden and plan to arrange an exercise Myoview (hold Coreg). This could be converted to  a Lexiscan study if needed.  2. Essential hypertension, systolic blood pressure in the 130s today. She could use on Coreg and HCTZ. Also trying to work on diet and weight loss.  3. Hyperlipidemia, managed via diet. Last LDL was 99. HDL 68.  4. Ischemic heart disease by history with testing from 2014 outlined above. Details of her reported event in [id number] in [place] are not clear.  Current medicines were reviewed with the patient today.   Orders Placed This Encounter  Procedures  . NM Myocar Multi W/Spect W/Wall Motion / EF    Disposition: Call with test results, schedule follow-up in 6 months routinely.  Signed, Jonelle SidleSamuel G. McDowell, MD, [facility] 03/21/2016 10:39 AM    [facility] [address], [place], [place] [address] Phone: [phone](336) [phone]; Fax: [phone]) [phone]

## 2016-03-21 ENCOUNTER — Encounter: Payer: Self-pay | Admitting: Cardiology

## 2016-03-21 ENCOUNTER — Ambulatory Visit (INDEPENDENT_AMBULATORY_CARE_PROVIDER_SITE_OTHER): Payer: Self-pay | Admitting: Cardiology

## 2016-03-21 VITALS — BP 130/78 | HR 71 | Ht 60.0 in | Wt 191.0 lb

## 2016-03-21 DIAGNOSIS — Z01810 Encounter for preprocedural cardiovascular examination: Secondary | ICD-10-CM

## 2016-03-21 DIAGNOSIS — I259 Chronic ischemic heart disease, unspecified: Secondary | ICD-10-CM

## 2016-03-21 DIAGNOSIS — I10 Essential (primary) hypertension: Secondary | ICD-10-CM

## 2016-03-21 DIAGNOSIS — E782 Mixed hyperlipidemia: Secondary | ICD-10-CM

## 2016-03-21 NOTE — Patient Instructions (Signed)
Your physician wants you to follow-up in: 6 months Dr Randa SpikeMcDowell You will receive a reminder letter in the mail two months in advance. If you don't receive a letter, please call our office to schedule the follow-up appointment.   Your physician has requested that you have a lexiscan myoview. For further information please visit [email or web address]. Please follow instruction sheet, as given.   HOLD COREG AM OF TEST    Your physician recommends that you continue on your current medications as directed. Please refer to the Current Medication list given to you today.   Thank you for choosing [facility] !

## 2016-03-22 ENCOUNTER — Encounter: Payer: Self-pay | Admitting: Family Medicine

## 2016-03-24 ENCOUNTER — Encounter: Payer: Self-pay | Admitting: Family Medicine

## 2016-03-28 ENCOUNTER — Inpatient Hospital Stay (HOSPITAL_COMMUNITY): Admission: RE | Admit: 2016-03-28 | Payer: Self-pay | Source: Ambulatory Visit

## 2016-03-28 ENCOUNTER — Ambulatory Visit (HOSPITAL_COMMUNITY): Admission: RE | Admit: 2016-03-28 | Payer: Self-pay | Source: Ambulatory Visit

## 2016-04-03 ENCOUNTER — Encounter: Payer: Self-pay | Admitting: Emergency Medicine

## 2016-04-03 ENCOUNTER — Encounter: Payer: Self-pay | Admitting: Family Medicine

## 2016-04-03 ENCOUNTER — Encounter: Payer: Self-pay | Admitting: Orthopaedic Surgery

## 2016-04-03 ENCOUNTER — Encounter: Payer: Self-pay | Admitting: Cardiology

## 2016-04-03 ENCOUNTER — Encounter: Payer: Self-pay | Admitting: Gastroenterology

## 2016-04-04 ENCOUNTER — Encounter: Payer: Self-pay | Admitting: Orthopaedic Surgery

## 2016-04-05 ENCOUNTER — Encounter: Payer: Self-pay | Admitting: Family Medicine

## 2016-04-06 ENCOUNTER — Encounter: Payer: Self-pay | Admitting: Family Medicine

## 2016-04-12 ENCOUNTER — Encounter: Payer: Self-pay | Admitting: *Deleted

## 2016-04-17 ENCOUNTER — Encounter (HOSPITAL_COMMUNITY): Payer: Self-pay

## 2016-04-26 ENCOUNTER — Ambulatory Visit: Payer: Self-pay | Admitting: Emergency Medicine

## 2016-05-03 ENCOUNTER — Ambulatory Visit: Payer: Self-pay | Admitting: Surgery

## 2016-05-16 ENCOUNTER — Encounter: Payer: Self-pay | Admitting: Family Medicine

## 2016-05-16 ENCOUNTER — Encounter: Payer: Self-pay | Admitting: Gastroenterology

## 2016-05-23 ENCOUNTER — Encounter: Payer: Self-pay | Admitting: Gastroenterology

## 2016-05-23 ENCOUNTER — Ambulatory Visit: Payer: Self-pay | Admitting: Orthopaedic Surgery

## 2016-05-24 ENCOUNTER — Encounter: Payer: Self-pay | Admitting: Gastroenterology

## 2016-05-27 ENCOUNTER — Encounter: Payer: Self-pay | Admitting: Gastroenterology

## 2016-05-29 ENCOUNTER — Encounter: Payer: Self-pay | Admitting: Gastroenterology

## 2016-05-30 ENCOUNTER — Encounter: Payer: Self-pay | Admitting: Gastroenterology

## 2016-06-12 ENCOUNTER — Encounter: Payer: Self-pay | Admitting: Gastroenterology

## 2016-06-12 ENCOUNTER — Encounter: Payer: Self-pay | Admitting: Cardiology

## 2016-06-13 ENCOUNTER — Encounter: Payer: Self-pay | Admitting: Gastroenterology

## 2016-06-13 ENCOUNTER — Encounter: Payer: Self-pay | Admitting: General Practice

## 2016-06-13 NOTE — Progress Notes (Signed)
Letter completed.

## 2016-06-14 ENCOUNTER — Encounter: Payer: Self-pay | Admitting: Gastroenterology

## 2016-06-15 ENCOUNTER — Encounter: Payer: Self-pay | Admitting: Gastroenterology

## 2016-08-10 ENCOUNTER — Emergency Department (HOSPITAL_COMMUNITY): Payer: Medicaid Other

## 2016-08-10 ENCOUNTER — Encounter (HOSPITAL_COMMUNITY): Payer: Self-pay | Admitting: *Deleted

## 2016-08-10 ENCOUNTER — Inpatient Hospital Stay (HOSPITAL_COMMUNITY)
Admission: EM | Admit: 2016-08-10 | Discharge: 2016-08-16 | DRG: 392 | Disposition: A | Discharge status: Home / Self Care | Payer: Medicaid Other | Attending: Family Medicine | Admitting: Family Medicine

## 2016-08-10 DIAGNOSIS — R51 Headache: Secondary | ICD-10-CM | POA: Diagnosis not present

## 2016-08-10 DIAGNOSIS — K589 Irritable bowel syndrome without diarrhea: Secondary | ICD-10-CM | POA: Diagnosis present

## 2016-08-10 DIAGNOSIS — K219 Gastro-esophageal reflux disease without esophagitis: Secondary | ICD-10-CM

## 2016-08-10 DIAGNOSIS — Z8249 Family history of ischemic heart disease and other diseases of the circulatory system: Secondary | ICD-10-CM | POA: Diagnosis not present

## 2016-08-10 DIAGNOSIS — K644 Residual hemorrhoidal skin tags: Secondary | ICD-10-CM

## 2016-08-10 DIAGNOSIS — R110 Nausea: Secondary | ICD-10-CM

## 2016-08-10 DIAGNOSIS — Z87891 Personal history of nicotine dependence: Secondary | ICD-10-CM

## 2016-08-10 DIAGNOSIS — I10 Essential (primary) hypertension: Secondary | ICD-10-CM

## 2016-08-10 DIAGNOSIS — E782 Mixed hyperlipidemia: Secondary | ICD-10-CM | POA: Diagnosis present

## 2016-08-10 DIAGNOSIS — K5732 Diverticulitis of large intestine without perforation or abscess without bleeding: Principal | ICD-10-CM | POA: Diagnosis present

## 2016-08-10 DIAGNOSIS — Z7982 Long term (current) use of aspirin: Secondary | ICD-10-CM | POA: Diagnosis not present

## 2016-08-10 DIAGNOSIS — I259 Chronic ischemic heart disease, unspecified: Secondary | ICD-10-CM | POA: Diagnosis present

## 2016-08-10 DIAGNOSIS — R112 Nausea with vomiting, unspecified: Secondary | ICD-10-CM | POA: Diagnosis not present

## 2016-08-10 DIAGNOSIS — T368X5A Adverse effect of other systemic antibiotics, initial encounter: Secondary | ICD-10-CM | POA: Diagnosis not present

## 2016-08-10 DIAGNOSIS — R1032 Left lower quadrant pain: Secondary | ICD-10-CM

## 2016-08-10 DIAGNOSIS — K648 Other hemorrhoids: Secondary | ICD-10-CM

## 2016-08-10 DIAGNOSIS — E876 Hypokalemia: Secondary | ICD-10-CM | POA: Diagnosis present

## 2016-08-10 DIAGNOSIS — K5792 Diverticulitis of intestine, part unspecified, without perforation or abscess without bleeding: Secondary | ICD-10-CM | POA: Diagnosis present

## 2016-08-10 LAB — COMPREHENSIVE METABOLIC PANEL
ALK PHOS: 68 U/L (ref 38–126)
ALT: 27 U/L (ref 14–54)
ANION GAP: 8 (ref 5–15)
AST: 20 U/L (ref 15–41)
Albumin: 3.9 g/dL (ref 3.5–5.0)
BILIRUBIN TOTAL: 0.6 mg/dL (ref 0.3–1.2)
BUN: 8 mg/dL (ref 6–20)
CALCIUM: 9 mg/dL (ref 8.9–10.3)
CO2: 26 mmol/L (ref 22–32)
Chloride: 102 mmol/L (ref 101–111)
Creatinine, Ser: 0.54 mg/dL (ref 0.44–1.00)
Glucose, Bld: 110 mg/dL — ABNORMAL HIGH (ref 65–99)
Potassium: 3.7 mmol/L (ref 3.5–5.1)
Sodium: 136 mmol/L (ref 135–145)
TOTAL PROTEIN: 7.9 g/dL (ref 6.5–8.1)

## 2016-08-10 LAB — URINALYSIS, ROUTINE W REFLEX MICROSCOPIC
Bilirubin Urine: NEGATIVE
Glucose, UA: NEGATIVE mg/dL
Ketones, ur: NEGATIVE mg/dL
NITRITE: NEGATIVE
PH: 6 (ref 5.0–8.0)
Protein, ur: NEGATIVE mg/dL
SPECIFIC GRAVITY, URINE: 1.014 (ref 1.005–1.030)

## 2016-08-10 LAB — CBC WITH DIFFERENTIAL/PLATELET
BASOS PCT: 0 %
Basophils Absolute: 0 10*3/uL (ref 0.0–0.1)
Eosinophils Absolute: 0.1 10*3/uL (ref 0.0–0.7)
Eosinophils Relative: 0 %
HEMATOCRIT: 37.7 % (ref 36.0–46.0)
Hemoglobin: 13 g/dL (ref 12.0–15.0)
Lymphocytes Relative: 14 %
Lymphs Abs: 1.6 10*3/uL (ref 0.7–4.0)
MCH: 29.6 pg (ref 26.0–34.0)
MCHC: 34.5 g/dL (ref 30.0–36.0)
MCV: 85.9 fL (ref 78.0–100.0)
MONO ABS: 0.9 10*3/uL (ref 0.1–1.0)
MONOS PCT: 8 %
NEUTROS ABS: 8.9 10*3/uL — AB (ref 1.7–7.7)
Neutrophils Relative %: 78 %
Platelets: 273 10*3/uL (ref 150–400)
RBC: 4.39 MIL/uL (ref 3.87–5.11)
RDW: 13 % (ref 11.5–15.5)
WBC: 11.5 10*3/uL — ABNORMAL HIGH (ref 4.0–10.5)

## 2016-08-10 LAB — LIPASE, BLOOD: LIPASE: 16 U/L (ref 11–51)

## 2016-08-10 MED ORDER — HEPARIN SODIUM (PORCINE) 5000 UNIT/ML IJ SOLN
5000.0000 [IU] | Freq: Three times a day (TID) | INTRAMUSCULAR | Status: DC
  Administered 2016-08-10 – 2016-08-15 (×17): 5000 [IU] via SUBCUTANEOUS
  Filled 2016-08-10 (×16): qty 1

## 2016-08-10 MED ORDER — CIPROFLOXACIN IN D5W 400 MG/200ML IV SOLN
400.0000 mg | Freq: Two times a day (BID) | INTRAVENOUS | Status: DC
  Administered 2016-08-10 – 2016-08-15 (×10): 400 mg via INTRAVENOUS
  Filled 2016-08-10 (×10): qty 200

## 2016-08-10 MED ORDER — METRONIDAZOLE IN NACL 5-0.79 MG/ML-% IV SOLN
500.0000 mg | Freq: Three times a day (TID) | INTRAVENOUS | Status: DC
  Administered 2016-08-10: 500 mg via INTRAVENOUS
  Filled 2016-08-10 (×2): qty 100

## 2016-08-10 MED ORDER — FENTANYL CITRATE (PF) 100 MCG/2ML IJ SOLN
50.0000 ug | Freq: Once | INTRAMUSCULAR | Status: AC
  Administered 2016-08-10: 50 ug via INTRAVENOUS
  Filled 2016-08-10: qty 2

## 2016-08-10 MED ORDER — ONDANSETRON HCL 4 MG PO TABS
4.0000 mg | ORAL_TABLET | Freq: Four times a day (QID) | ORAL | Status: DC | PRN
  Administered 2016-08-15 – 2016-08-16 (×2): 4 mg via ORAL
  Filled 2016-08-10 (×2): qty 1

## 2016-08-10 MED ORDER — ONDANSETRON HCL 4 MG/2ML IJ SOLN
4.0000 mg | Freq: Once | INTRAMUSCULAR | Status: AC
  Administered 2016-08-10: 4 mg via INTRAVENOUS
  Filled 2016-08-10: qty 2

## 2016-08-10 MED ORDER — ACETAMINOPHEN 650 MG RE SUPP: 650.0000 mg | Freq: Four times a day (QID) | RECTAL | Status: DC | PRN

## 2016-08-10 MED ORDER — SODIUM CHLORIDE 0.9 % IV SOLN
INTRAVENOUS | Status: AC
  Administered 2016-08-10: 17:00:00 via INTRAVENOUS

## 2016-08-10 MED ORDER — ACETAMINOPHEN 325 MG PO TABS
650.0000 mg | ORAL_TABLET | Freq: Four times a day (QID) | ORAL | Status: DC | PRN
  Administered 2016-08-11 – 2016-08-12 (×2): 650 mg via ORAL
  Filled 2016-08-10 (×2): qty 2

## 2016-08-10 MED ORDER — IOPAMIDOL (ISOVUE-300) INJECTION 61%
100.0000 mL | Freq: Once | INTRAVENOUS | Status: AC | PRN
  Administered 2016-08-10: 100 mL via INTRAVENOUS

## 2016-08-10 MED ORDER — ONDANSETRON HCL 4 MG/2ML IJ SOLN
4.0000 mg | Freq: Four times a day (QID) | INTRAMUSCULAR | Status: DC | PRN
  Administered 2016-08-11 – 2016-08-13 (×8): 4 mg via INTRAVENOUS
  Filled 2016-08-10 (×8): qty 2

## 2016-08-10 MED ORDER — FENTANYL CITRATE (PF) 100 MCG/2ML IJ SOLN
50.0000 ug | INTRAMUSCULAR | Status: DC | PRN
Start: 2016-08-10 — End: 2016-08-16

## 2016-08-10 MED ORDER — CIPROFLOXACIN IN D5W 400 MG/200ML IV SOLN
400.0000 mg | Freq: Once | INTRAVENOUS | Status: AC
  Administered 2016-08-10: 400 mg via INTRAVENOUS
  Filled 2016-08-10: qty 200

## 2016-08-10 MED ORDER — METRONIDAZOLE IN NACL 5-0.79 MG/ML-% IV SOLN
500.0000 mg | Freq: Once | INTRAVENOUS | Status: AC
  Administered 2016-08-10: 500 mg via INTRAVENOUS
  Filled 2016-08-10: qty 100

## 2016-08-10 NOTE — H&P (Signed)
History and Physical    Lisa [name] JYN:[id number] DOB: 1960/10/24 DOA: 08/10/2016  PCP: Elenora Gamma, MD   Patient coming from:Home  Chief Complaint: Abdominal pain  HPI: Lisa [name] is a 56 y.o. female with medical history significant of ischemic heart disease, recurrent diverticulitis (patient reports that a year ago she was told that she should get a partial colectomy for this),  GERD, HTN that presents with abdominal pain.  Patient reports she has had pain that began 4 days ago that has worsened over the past 4 days.  Her last flare was an unspecified amount of time ago.  Patient reports that she gets about 2-3 flares a year.  She sees Dr. Darrick Penna outpatient for her gastroenterology.  She reports that she was to get a stress test prior to this procedure though because she had a stress test in 2014 that showed ischemic cardiac disease.  She mentions that there is concern that her cardiac disease would not allow her to undergo the partial colectomy.  She mentions that she has been denied for disability and therefore has not been able to get her stress test.  ED Course: Patient seen and evaluated by EDP. WBC of 11.5, H/H WNL, BMP WNL.  UA showing small hemoglobin and small leukocytes.  She underwent CT scan which showed Extensive changes of diverticulitis in the distal sigmoid colon with extensive wall thickening and surrounding mesenteric thickening. This mesenteric thickening extends into the upper pelvis in the midline. There is extensive left-sided colonic diverticulosis. In the distal descending colon region, there is probable scarring in the adjacent mesenteric prior diverticulitis. A less likely second smaller focus of diverticulitis in this area is possible but felt to be less likely. There is no perforation or frank abscess in the region of diverticulitis. There is bowel wall edema. In the earliest changes of phlegmon formation in this area cannot be entirely excluded by  CT.  Review of Systems: As per HPI otherwise 10 point review of systems negative.    Past Medical History:  Diagnosis Date  . Allergy   . Anemia   . Arthritis   . Asthma   . Bursitis   . Chronic back pain    Patient reports lower back disc problems  . Degenerative lumbar disc   . Diverticulitis   . Enlarged uterus 03/18/2014  . Essential hypertension, benign   . GERD (gastroesophageal reflux disease)   . High blood pressure   . High cholesterol   . Mixed hyperlipidemia   . Mixed stress and urge urinary incontinence 03/18/2014  . Myocardial infarction [facility])    Patient reports related to "steroids" in 1985 ([place])  . PMB (postmenopausal bleeding) 03/18/2014  . Seizures (HCC)   . Stroke (HCC)   . Tendinitis    Patient reports chronic problem involving her arms and hands    Past Surgical History:  Procedure Laterality Date  . adnoids    . CESAREAN SECTION    . COLONOSCOPY N/A 06/04/2014   SLF: 1. moderate diverticulosis in teh sigmoid colon, descending colon, and transverse colon. 2. Two colon polyps removed 3. Large external and small internal hemorroids.   . TONSILLECTOMY       reports that she quit smoking about 31 years ago. Her smoking use included Cigarettes. She has a 24.00 pack-year smoking history. She has never used smokeless tobacco. She reports that she does not drink alcohol or use drugs.  Allergies  Allergen Reactions  . Metoprolol Nausea And Vomiting  Dizziness Elevated BP  . Morphine And Related Other (See Comments)    Disoriented, vomiting and nausea  . Naproxen Other (See Comments)    Lightheadedness  . Tramadol Nausea And Vomiting  . Ibuprofen Rash  . Penicillins Nausea Only and Rash    Has patient had a PCN reaction causing immediate rash, facial/tongue/throat swelling, SOB or lightheadedness with hypotension:no Has patient had a PCN reaction causing severe rash involving mucus membranes or skin necrosis: yes Has patient had a PCN reaction that  required hospitalization: no Has patient had a PCN reaction occurring within the last 10 years: no If all of the above answers are "NO", then may proceed with Cephalosporin use.     Family History  Problem Relation Age of Onset  . Adopted: Yes  . Other Mother        aneursym  . Other Maternal Uncle        stenosis  . Arthritis Maternal Uncle   . Angina Son   . ADD / ADHD Son   . Other Son        food allergies  . Blindness Daughter        from birth; brain damage  . Drug abuse Sister   . Heart attack Paternal Aunt   . Alcohol abuse Cousin   . Drug abuse Cousin      Prior to Admission medications   Medication Sig Start Date End Date Taking? Authorizing Provider  albuterol (PROVENTIL HFA;VENTOLIN HFA) 108 (90 BASE) MCG/ACT inhaler Inhale 2 puffs into the lungs every 6 (six) hours as needed for wheezing.   Yes [provider]  aspirin EC 81 MG tablet Take 81 mg by mouth daily.   Yes [provider]  carvedilol (COREG) 6.25 MG tablet Take 1 tablet (6.25 mg total) by mouth 2 (two) times daily with a meal. 03/15/16  Yes Elenora Gamma, MD  CHARCOAL ACTIVATED PO Take 2 tablets by mouth as needed.   Yes [provider]  cholecalciferol (VITAMIN D) 1000 units tablet Take 1,000 Units by mouth daily.   Yes [provider]  Cyanocobalamin (B-12 PO) Take 1 tablet by mouth daily.   Yes [provider]  dexlansoprazole (DEXILANT) 60 MG capsule Take 1 capsule (60 mg total) by mouth daily. 01/21/16  Yes Gelene Mink, NP  DimenhyDRINATE (EQ MOTION SICKNESS PO) Apply 1 application topically as needed (for nausea/vomiting). Reported on 08/25/2015   Yes [provider]  GARLIC PO Take 1 tablet by mouth daily.   Yes [provider]  Ginger, Zingiber officinalis, (GINGER PO) Take 1 tablet by mouth daily as needed (nausea).   Yes [provider]  hydrochlorothiazide (HYDRODIURIL) 25 MG tablet Take 1 tablet (25 mg total) by mouth  daily. 01/24/16  Yes Elenora Gamma, MD  linaclotide (LINZESS) 145 MCG CAPS capsule Take 145 mcg by mouth daily before breakfast.   Yes [provider]  methylcellulose (FIBER THERAPY) oral powder Take 1 packet by mouth 3 (three) times daily. 1 tsp daily in the morning   Yes [provider]  TURMERIC PO Take 1 tablet by mouth daily.   Yes [provider]  azithromycin (ZITHROMAX) 250 MG tablet Take 2 tablets on day 1 and 1 tablet daily after that Patient not taking: Reported on 08/10/2016 03/15/16   Elenora Gamma, MD    Physical Exam: Vitals:   08/10/16 1300 08/10/16 1315 08/10/16 1330 08/10/16 1345  BP: 137/69  (!) 159/88   Pulse:  79 78 82 79  Resp:      Temp:      TempSrc:      SpO2: 95% 95% 95% 98%  Height:          Constitutional: NAD, calm, comfortable Vitals:   08/10/16 1300 08/10/16 1315 08/10/16 1330 08/10/16 1345  BP: 137/69  (!) 159/88   Pulse: 79 78 82 79  Resp:      Temp:      TempSrc:      SpO2: 95% 95% 95% 98%  Height:       Eyes: PERRL, lids and conjunctivae normal ENMT: Mucous membranes are moist. Posterior pharynx clear of any exudate or lesions.Normal dentition.  Neck: normal, supple, no masses, no thyromegaly Respiratory: clear to auscultation bilaterally, no wheezing, no crackles. Normal respiratory effort. No accessory muscle use.  Cardiovascular: Regular rate and rhythm, no murmurs / rubs / gallops. No extremity edema. 2+ pedal pulses. No carotid bruits.  Abdomen: tenderness in periumbilical area as well as left lower quadrant, no masses palpated. No hepatosplenomegaly. Bowel sounds positive.  Musculoskeletal: no clubbing / cyanosis. No joint deformity upper and lower extremities. Good ROM, no contractures. Normal muscle tone.  Skin: no rashes, lesions, ulcers. No induration Neurologic: CN 2-12 grossly intact. Sensation intact, DTR normal. Strength 5/5 in all 4.  Psychiatric: Normal judgment and insight. Alert and  oriented x 3. Normal mood.   Labs on Admission: I have personally reviewed following labs and imaging studies  CBC:  Recent Labs Lab 08/10/16 1041  WBC 11.5*  NEUTROABS 8.9*  HGB 13.0  HCT 37.7  MCV 85.9  PLT 273   Basic Metabolic Panel:  Recent Labs Lab 08/10/16 1041  NA 136  K 3.7  CL 102  CO2 26  GLUCOSE 110*  BUN 8  CREATININE 0.54  CALCIUM 9.0   GFR: CrCl cannot be calculated (Unknown ideal weight.). Liver Function Tests:  Recent Labs Lab 08/10/16 1041  AST 20  ALT 27  ALKPHOS 68  BILITOT 0.6  PROT 7.9  ALBUMIN 3.9    Recent Labs Lab 08/10/16 1041  LIPASE 16   No results for input(s): AMMONIA in the last 168 hours. Coagulation Profile: No results for input(s): INR, PROTIME in the last 168 hours. Cardiac Enzymes: No results for input(s): CKTOTAL, CKMB, CKMBINDEX, TROPONINI in the last 168 hours. BNP (last 3 results) No results for input(s): PROBNP in the last 8760 hours. HbA1C: No results for input(s): HGBA1C in the last 72 hours. CBG: No results for input(s): GLUCAP in the last 168 hours. Lipid Profile: No results for input(s): CHOL, HDL, LDLCALC, TRIG, CHOLHDL, LDLDIRECT in the last 72 hours. Thyroid Function Tests: No results for input(s): TSH, T4TOTAL, FREET4, T3FREE, THYROIDAB in the last 72 hours. Anemia Panel: No results for input(s): VITAMINB12, FOLATE, FERRITIN, TIBC, IRON, RETICCTPCT in the last 72 hours. Urine analysis:    Component Value Date/Time   COLORURINE YELLOW 08/10/2016 1041   APPEARANCEUR CLEAR 08/10/2016 1041   LABSPEC 1.014 08/10/2016 1041   PHURINE 6.0 08/10/2016 1041   GLUCOSEU NEGATIVE 08/10/2016 1041   HGBUR SMALL (A) 08/10/2016 1041   BILIRUBINUR NEGATIVE 08/10/2016 1041   KETONESUR NEGATIVE 08/10/2016 1041   PROTEINUR NEGATIVE 08/10/2016 1041   UROBILINOGEN 0.2 11/18/2014 0520   NITRITE NEGATIVE 08/10/2016 1041   LEUKOCYTESUR SMALL (A) 08/10/2016 1041   Sepsis Labs:  !!!!!!!!!!!!!!!!!!!!!!!!!!!!!!!!!!!!!!!!!!!! @LABRCNTIP (procalcitonin:4,lacticidven:4) )No results found for this or any previous visit (from the past 240 hour(s)).   Radiological Exams on Admission: Ct  Abdomen Pelvis W Contrast  Result Date: 08/10/2016 CLINICAL DATA:  Four day history abdominal pain EXAM: CT ABDOMEN AND PELVIS WITH CONTRAST TECHNIQUE: Multidetector CT imaging of the abdomen and pelvis was performed using the standard protocol following bolus administration of intravenous contrast. CONTRAST:  ISOVUE-300 IOPAMIDOL (ISOVUE-300) INJECTION 61% COMPARISON:  Jul 13, 2015 FINDINGS: Lower chest: Lung bases are clear. Hepatobiliary: There is a stable focus of decreased attenuation posterior segment right lobe of the liver, a likely small cyst. No new liver lesions evident. Gallbladder wall is not appreciably thickened. There is no biliary duct dilatation. Pancreas: No pancreatic mass or inflammatory focus. Spleen: No splenic lesions are evident. Adrenals/Urinary Tract: Right adrenal appears normal. There is a 1.1 x 1.0 cm probable adenoma in the left adrenal. Kidneys bilaterally show no evident mass or hydronephrosis on either side. There is no renal or ureteral calculus on either side. Urinary bladder is midline with wall thickness within normal limits. Stomach/Bowel: There is diffuse wall thickening in the mid the distal sigmoid colon with surrounding mesenteric thickening several irregular diverticulum consistent with diverticulitis in this region. There is a small amount of loculated fluid in this area. There is no well-defined abscess in this area. No perforation evident. There are multiple diverticula in the descending colon region. There is slight mesenteric thickening in the left lower quadrant in this area, probably due to scarring from prior diverticulitis in this area. There is no appreciable bowel wall or mesenteric thickening elsewhere. No bowel obstruction. There is no demonstrable  free air or portal venous air. Vascular/Lymphatic: There are foci of atherosclerotic calcification in the aorta and bifurcation regions. No aneurysm evident. Major mesenteric vessels appear patent. There is no adenopathy in the abdomen or pelvis. Reproductive: Uterus is anteverted.  No pelvic masses evident. Other: Appendix appears unremarkable. There is no frank abscess for ascites in the abdomen or pelvis. Note that there is a small amount of loculated fluid in the area of diverticulitis. There is thinning of the rectus muscle in the midline without frank hernia. Musculoskeletal: There are no blastic or lytic bone lesions. There is no intramuscular or abdominal wall lesion. IMPRESSION: Extensive changes of diverticulitis in the distal sigmoid colon with extensive wall thickening and surrounding mesenteric thickening. This mesenteric thickening extends into the upper pelvis in the midline. There is extensive left-sided colonic diverticulosis. In the distal descending colon region, there is probable scarring in the adjacent mesenteric prior diverticulitis. A less likely second smaller focus of diverticulitis in this area is possible but felt to be less likely. There is no perforation or frank abscess in the region of diverticulitis. There is bowel wall edema. In the earliest changes of phlegmon formation in this area cannot be entirely excluded by CT. No bowel obstruction. No abscess elsewhere in the abdomen pelvis. Appendix region appears normal. No renal or ureteral calculus.  No hydronephrosis. Mild aortic atherosclerosis. Electronically Signed   By: Bretta Bang III M.D.   On: 08/10/2016 11:54    EKG: not done  Assessment/Plan Active Problems:   Essential hypertension, benign   Diverticulitis large intestine   Chronic ischemic heart disease   GERD (gastroesophageal reflux disease)   IBS (irritable bowel syndrome)   Internal and external hemorrhoids without complication     Diverticulitis -  NPO - IVF at 129ml/hr x 12 hours - Cipro and Flagyl IV - GI consulted (patient reports her diverticulitis has been so bad before she was supposed to get a partial colectomy) - pain control  with fentanyl - pending clinical course consider consulting general surgery  Essential HTN - NPO currently - will order hydralazine PRN for HTN  GERD - IV protonix  IBS - has had diarrhea since taking Linzess three days ago - hold linzess as patient is NPO      DVT prophylaxis: subcutaneous heparin Code Status: full Code  Family Communication: husband bedside  Disposition Plan: likely discharge back to previous home environment Consults called: gastroenterology  Admission status: inpatient, telemetry   Katrinka Blazing MD [facility] Pager [phone]  If 7PM-7AM, please contact night-coverage [email or web address] Password [email or web address]  08/10/2016, 2:27 PM

## 2016-08-10 NOTE — ED Provider Notes (Signed)
[facility]-EMER[facility]ENCY DEPT Provider Note   CSN: [id number] Arrival date & time: 08/10/16  0935     History   Chief Complaint Chief Complaint  Patient presents with  . Abdominal Pain    HPI Lisa [name] is a 56 y.o. female with a history as outlined below, most significant for diverticulitis, GERD and IBS presenting with left lower abdominal pain she feels is a diverticulitis flare which started 4 days ago and progressively worsening. She reports nausea without vomiting, subjective fever and several episodes of nonbloody diarrhea daily. She reports abdominal distention and loss of appetite.  She is followed by Dr Darrick Penna for her GI issues and has been advised she should undergo a partial colectomy due to her advanced diverticular disease but has been limited due to inability to get cardiac clearance for this surgery.  She has found no alleviators for her symptoms.    The history is provided by the patient and the spouse.    Past Medical History:  Diagnosis Date  . Allergy   . Anemia   . Arthritis   . Asthma   . Bursitis   . Chronic back pain    Patient reports lower back disc problems  . Degenerative lumbar disc   . Diverticulitis   . Enlarged uterus 03/18/2014  . Essential hypertension, benign   . GERD (gastroesophageal reflux disease)   . High blood pressure   . High cholesterol   . Mixed hyperlipidemia   . Mixed stress and urge urinary incontinence 03/18/2014  . Myocardial infarction [facility])    Patient reports related to "steroids" in 1985 ([place])  . PMB (postmenopausal bleeding) 03/18/2014  . Seizures (HCC)   . Stroke (HCC)   . Tendinitis    Patient reports chronic problem involving her arms and hands    Patient Active Problem List   Diagnosis Date Noted  . Acute diverticulitis 08/10/2016  . Obesity 12/21/2015  . Chest pain 10/19/2015  . Diverticulitis 07/13/2015  . GERD (gastroesophageal reflux disease) 06/30/2015  . IBS (irritable bowel syndrome) 06/30/2015  .  Internal and external hemorrhoids without complication 06/30/2015  . Chronic ischemic heart disease 01/07/2015  . Prediabetes 01/07/2015  . Healthcare maintenance 01/07/2015  . Diverticulitis large intestine 08/25/2014  . Change in bowel habits   . Chronic bilateral lower abdominal pain 05/13/2014  . PMB (postmenopausal bleeding) 03/18/2014  . Enlarged uterus 03/18/2014  . Mixed stress and urge urinary incontinence 03/18/2014  . Asthma, extrinsic 09/15/2013  . Dyspnea 04/18/2013  . Abnormal cardiovascular function study 01/21/2013  . Precordial pain 12/10/2012  . Essential hypertension, benign 12/10/2012  . Mixed hyperlipidemia 12/10/2012    Past Surgical History:  Procedure Laterality Date  . adnoids    . CESAREAN SECTION    . COLONOSCOPY N/A 06/04/2014   SLF: 1. moderate diverticulosis in teh sigmoid colon, descending colon, and transverse colon. 2. Two colon polyps removed 3. Large external and small internal hemorroids.   . TONSILLECTOMY      OB History    Gravida Para Term Preterm AB Living   8 8           SAB TAB Ectopic Multiple Live Births                   Home Medications    Prior to Admission medications   Medication Sig Start Date End Date Taking? Authorizing Provider  albuterol (PROVENTIL HFA;VENTOLIN HFA) 108 (90 BASE) MCG/ACT inhaler Inhale 2 puffs into the lungs every 6 (six)  hours as needed for wheezing.   Yes [provider]  aspirin EC 81 MG tablet Take 81 mg by mouth daily.   Yes [provider]  carvedilol (COREG) 6.25 MG tablet Take 1 tablet (6.25 mg total) by mouth 2 (two) times daily with a meal. 03/15/16  Yes Elenora Gamma, MD  CHARCOAL ACTIVATED PO Take 2 tablets by mouth as needed.   Yes [provider]  cholecalciferol (VITAMIN D) 1000 units tablet Take 1,000 Units by mouth daily.   Yes [provider]  Cyanocobalamin (B-12 PO) Take 1 tablet by mouth daily.   Yes [provider]  dexlansoprazole  (DEXILANT) 60 MG capsule Take 1 capsule (60 mg total) by mouth daily. 01/21/16  Yes Gelene Mink, NP  DimenhyDRINATE (EQ MOTION SICKNESS PO) Apply 1 application topically as needed (for nausea/vomiting). Reported on 08/25/2015   Yes [provider]  GARLIC PO Take 1 tablet by mouth daily.   Yes [provider]  Ginger, Zingiber officinalis, (GINGER PO) Take 1 tablet by mouth daily as needed (nausea).   Yes [provider]  hydrochlorothiazide (HYDRODIURIL) 25 MG tablet Take 1 tablet (25 mg total) by mouth daily. 01/24/16  Yes Elenora Gamma, MD  linaclotide (LINZESS) 145 MCG CAPS capsule Take 145 mcg by mouth daily before breakfast.   Yes [provider]  methylcellulose (FIBER THERAPY) oral powder Take 1 packet by mouth 3 (three) times daily. 1 tsp daily in the morning   Yes [provider]  TURMERIC PO Take 1 tablet by mouth daily.   Yes [provider]  azithromycin (ZITHROMAX) 250 MG tablet Take 2 tablets on day 1 and 1 tablet daily after that Patient not taking: Reported on 08/10/2016 03/15/16   Elenora Gamma, MD    Family History Family History  Problem Relation Age of Onset  . Adopted: Yes  . Other Mother        aneursym  . Other Maternal Uncle        stenosis  . Arthritis Maternal Uncle   . Angina Son   . ADD / ADHD Son   . Other Son        food allergies  . Blindness Daughter        from birth; brain damage  . Drug abuse Sister   . Heart attack Paternal Aunt   . Alcohol abuse Cousin   . Drug abuse Cousin     Social History Social History  Substance Use Topics  . Smoking status: Former Smoker    Packs/day: 2.00    Years: 12.00    Types: Cigarettes    Quit date: 03/13/1985  . Smokeless tobacco: Never Used     Comment: Quit x 10 years ago  . Alcohol use No     Allergies   Metoprolol; Morphine and related; Naproxen; Tramadol; Ibuprofen; and Penicillins   Review of Systems Review of Systems    Constitutional: Positive for fever.  HENT: Negative for congestion and sore throat.   Eyes: Negative.   Respiratory: Negative for chest tightness and shortness of breath.   Cardiovascular: Negative for chest pain.  Gastrointestinal: Positive for abdominal pain, diarrhea and nausea. Negative for blood in stool and vomiting.  Genitourinary: Negative.  Negative for difficulty urinating.  Musculoskeletal: Negative for arthralgias, joint swelling and neck pain.  Skin: Negative.  Negative for rash and wound.  Neurological: Negative for dizziness, weakness, light-headedness, numbness and headaches.  Psychiatric/Behavioral: Negative.  Physical Exam Updated Vital Signs BP (!) 155/69   Pulse 74   Temp 98.4 F (36.9 C) (Oral)   Resp 18   Ht 5' (1.524 m)   LMP 12/26/2011   SpO2 94%   Physical Exam  Constitutional: She appears well-developed and well-nourished.  HENT:  Head: Normocephalic and atraumatic.  Eyes: Conjunctivae are normal.  Neck: Normal range of motion.  Cardiovascular: Normal rate, regular rhythm, normal heart sounds and intact distal pulses.   Pulmonary/Chest: Effort normal and breath sounds normal. She has no wheezes.  Abdominal: Soft. Bowel sounds are normal. She exhibits distension. There is tenderness in the periumbilical area, left upper quadrant and left lower quadrant. There is guarding. There is no rebound.  Increased tympany to percussion. Active bowel sounds.  Musculoskeletal: Normal range of motion.  Neurological: She is alert.  Skin: Skin is warm and dry.  Psychiatric: She has a normal mood and affect.  Nursing note and vitals reviewed.    ED Treatments / Results  Labs (all labs ordered are listed, but only abnormal results are displayed) Labs Reviewed  CBC WITH DIFFERENTIAL/PLATELET - Abnormal; Notable for the following:       Result Value   WBC 11.5 (*)    Neutro Abs 8.9 (*)    All other components within normal limits  URINALYSIS, ROUTINE W  REFLEX MICROSCOPIC - Abnormal; Notable for the following:    Hgb urine dipstick SMALL (*)    Leukocytes, UA SMALL (*)    Bacteria, UA RARE (*)    Squamous Epithelial / LPF 0-5 (*)    All other components within normal limits  COMPREHENSIVE METABOLIC PANEL - Abnormal; Notable for the following:    Glucose, Bld 110 (*)    All other components within normal limits  LIPASE, BLOOD  HIV ANTIBODY (ROUTINE TESTING)    EKG  EKG Interpretation None       Radiology Ct Abdomen Pelvis W Contrast  Result Date: 08/10/2016 CLINICAL DATA:  Four day history abdominal pain EXAM: CT ABDOMEN AND PELVIS WITH CONTRAST TECHNIQUE: Multidetector CT imaging of the abdomen and pelvis was performed using the standard protocol following bolus administration of intravenous contrast. CONTRAST:  ISOVUE-300 IOPAMIDOL (ISOVUE-300) INJECTION 61% COMPARISON:  Jul 13, 2015 FINDINGS: Lower chest: Lung bases are clear. Hepatobiliary: There is a stable focus of decreased attenuation posterior segment right lobe of the liver, a likely small cyst. No new liver lesions evident. Gallbladder wall is not appreciably thickened. There is no biliary duct dilatation. Pancreas: No pancreatic mass or inflammatory focus. Spleen: No splenic lesions are evident. Adrenals/Urinary Tract: Right adrenal appears normal. There is a 1.1 x 1.0 cm probable adenoma in the left adrenal. Kidneys bilaterally show no evident mass or hydronephrosis on either side. There is no renal or ureteral calculus on either side. Urinary bladder is midline with wall thickness within normal limits. Stomach/Bowel: There is diffuse wall thickening in the mid the distal sigmoid colon with surrounding mesenteric thickening several irregular diverticulum consistent with diverticulitis in this region. There is a small amount of loculated fluid in this area. There is no well-defined abscess in this area. No perforation evident. There are multiple diverticula in the descending  colon region. There is slight mesenteric thickening in the left lower quadrant in this area, probably due to scarring from prior diverticulitis in this area. There is no appreciable bowel wall or mesenteric thickening elsewhere. No bowel obstruction. There is no demonstrable free air or portal venous air. Vascular/Lymphatic: There are  foci of atherosclerotic calcification in the aorta and bifurcation regions. No aneurysm evident. Major mesenteric vessels appear patent. There is no adenopathy in the abdomen or pelvis. Reproductive: Uterus is anteverted.  No pelvic masses evident. Other: Appendix appears unremarkable. There is no frank abscess for ascites in the abdomen or pelvis. Note that there is a small amount of loculated fluid in the area of diverticulitis. There is thinning of the rectus muscle in the midline without frank hernia. Musculoskeletal: There are no blastic or lytic bone lesions. There is no intramuscular or abdominal wall lesion. IMPRESSION: Extensive changes of diverticulitis in the distal sigmoid colon with extensive wall thickening and surrounding mesenteric thickening. This mesenteric thickening extends into the upper pelvis in the midline. There is extensive left-sided colonic diverticulosis. In the distal descending colon region, there is probable scarring in the adjacent mesenteric prior diverticulitis. A less likely second smaller focus of diverticulitis in this area is possible but felt to be less likely. There is no perforation or frank abscess in the region of diverticulitis. There is bowel wall edema. In the earliest changes of phlegmon formation in this area cannot be entirely excluded by CT. No bowel obstruction. No abscess elsewhere in the abdomen pelvis. Appendix region appears normal. No renal or ureteral calculus.  No hydronephrosis. Mild aortic atherosclerosis. Electronically Signed   By: Bretta BangWilliam  Woodruff III M.D.   On: 08/10/2016 11:54    Procedures Procedures (including  critical care time)  Medications Ordered in ED Medications  heparin injection 5,000 Units (not administered)  acetaminophen (TYLENOL) tablet 650 mg (not administered)    Or  acetaminophen (TYLENOL) suppository 650 mg (not administered)  ondansetron (ZOFRAN) tablet 4 mg (not administered)    Or  ondansetron (ZOFRAN) injection 4 mg (not administered)  fentaNYL (SUBLIMAZE) injection 50 mcg (not administered)  0.9 %  sodium chloride infusion (not administered)  ciprofloxacin (CIPRO) IVPB 400 mg (not administered)  metroNIDAZOLE (FLAGYL) IVPB 500 mg (not administered)  fentaNYL (SUBLIMAZE) injection 50 mcg (50 mcg Intravenous Given 08/10/16 1048)  ondansetron (ZOFRAN) injection 4 mg (4 mg Intravenous Given 08/10/16 1048)  iopamidol (ISOVUE-300) 61 % injection 100 mL (100 mLs Intravenous Contrast Given 08/10/16 1129)  metroNIDAZOLE (FLAGYL) IVPB 500 mg (0 mg Intravenous Stopped 08/10/16 1331)  ciprofloxacin (CIPRO) IVPB 400 mg (0 mg Intravenous Stopped 08/10/16 1331)  fentaNYL (SUBLIMAZE) injection 50 mcg (50 mcg Intravenous Given 08/10/16 1356)     Initial Impression / Assessment and Plan / ED Course  I have reviewed the triage vital signs and the nursing notes.  Pertinent labs & imaging results that were available during my care of the patient were reviewed by me and considered in my medical decision making (see chart for details).     Pt with acute diverticulitis per Ct imaging.  Cannot rule out early phlegmon.  Pt was treated with IV cipro and flagyl.  Will plan admission for ongoing care.   Final Clinical Impressions(s) / ED Diagnoses   Final diagnoses:  Left lower quadrant pain  Diverticulitis of colon    New Prescriptions Current Discharge Medication List       Burgess Amordol, Frandy Basnett, Cordelia Poche-C 08/10/16 1623    Pricilla LovelessGoldston, Scott, MD 08/17/16 0030

## 2016-08-10 NOTE — ED Triage Notes (Signed)
Abdominal pain, his tory of diverticulitis 

## 2016-08-10 NOTE — ED Notes (Signed)
Patient transported to X-ray 

## 2016-08-11 DIAGNOSIS — K581 Irritable bowel syndrome with constipation: Secondary | ICD-10-CM

## 2016-08-11 DIAGNOSIS — I259 Chronic ischemic heart disease, unspecified: Secondary | ICD-10-CM

## 2016-08-11 DIAGNOSIS — R1032 Left lower quadrant pain: Secondary | ICD-10-CM

## 2016-08-11 DIAGNOSIS — K5732 Diverticulitis of large intestine without perforation or abscess without bleeding: Principal | ICD-10-CM

## 2016-08-11 DIAGNOSIS — R110 Nausea: Secondary | ICD-10-CM

## 2016-08-11 DIAGNOSIS — K5792 Diverticulitis of intestine, part unspecified, without perforation or abscess without bleeding: Secondary | ICD-10-CM

## 2016-08-11 LAB — BASIC METABOLIC PANEL
ANION GAP: 7 (ref 5–15)
BUN: 5 mg/dL — ABNORMAL LOW (ref 6–20)
CALCIUM: 8.4 mg/dL — AB (ref 8.9–10.3)
CHLORIDE: 103 mmol/L (ref 101–111)
CO2: 26 mmol/L (ref 22–32)
Creatinine, Ser: 0.6 mg/dL (ref 0.44–1.00)
GFR calc non Af Amer: >60 mL/min (ref >60)
Glucose, Bld: 128 mg/dL — ABNORMAL HIGH (ref 65–99)
Potassium: 3 mmol/L — ABNORMAL LOW (ref 3.5–5.1)
SODIUM: 136 mmol/L (ref 135–145)

## 2016-08-11 LAB — HIV ANTIBODY (ROUTINE TESTING W REFLEX): HIV Screen 4th Generation wRfx: NONREACTIVE

## 2016-08-11 MED ORDER — HYDROCHLOROTHIAZIDE 25 MG PO TABS
25.0000 mg | ORAL_TABLET | Freq: Every day | ORAL | Status: DC
  Administered 2016-08-11 – 2016-08-16 (×5): 25 mg via ORAL
  Filled 2016-08-11 (×6): qty 1

## 2016-08-11 MED ORDER — CARVEDILOL 3.125 MG PO TABS
6.2500 mg | ORAL_TABLET | Freq: Two times a day (BID) | ORAL | Status: DC
  Administered 2016-08-11 – 2016-08-16 (×8): 6.25 mg via ORAL
  Filled 2016-08-11 (×8): qty 2

## 2016-08-11 MED ORDER — METRONIDAZOLE IN NACL 5-0.79 MG/ML-% IV SOLN
500.0000 mg | Freq: Three times a day (TID) | INTRAVENOUS | Status: DC
  Administered 2016-08-11 – 2016-08-15 (×12): 500 mg via INTRAVENOUS
  Filled 2016-08-11 (×11): qty 100

## 2016-08-11 MED ORDER — POTASSIUM CHLORIDE 10 MEQ/100ML IV SOLN
10.0000 meq | INTRAVENOUS | Status: AC
  Administered 2016-08-11 (×4): 10 meq via INTRAVENOUS
  Filled 2016-08-11: qty 100

## 2016-08-11 NOTE — Progress Notes (Signed)
PROGRESS NOTE    Lisa [name]  ZOX:[id number] DOB: [birth year] DOA: 08/10/2016 PCP: Elenora Gamma, MD    Brief Narrative:   Lisa [name] is a 56 y.o. female with medical history significant of ischemic heart disease, recurrent diverticulitis (patient reports that a year ago she was told that she should get a partial colectomy for this),  GERD, HTN that presents with abdominal pain.  Patient reports she has had pain that began 4 days ago that has worsened over the past 4 days.  Her last flare was an unspecified amount of time ago.  Patient reports that she gets about 2-3 flares a year.  She sees Dr. Darrick Penna outpatient for her gastroenterology.  She reports that she was to get a stress test prior to this procedure though because she had a stress test in 2014 that showed ischemic cardiac disease.  She mentions that there is concern that her cardiac disease would not allow her to undergo the partial colectomy.  She mentions that she has been denied for disability and therefore has not been able to get her stress test.   Assessment & Plan:   Principal Problem:   Diverticulitis large intestine Active Problems:   Essential hypertension, benign   Chronic ischemic heart disease   GERD (gastroesophageal reflux disease)   IBS (irritable bowel syndrome)   Internal and external hemorrhoids without complication   Acute diverticulitis   Left lower quadrant pain   Nausea without vomiting   Diverticulitis - can start clear liquids - IVF at 159ml/hr x 12 hours - Cipro and Flagyl IV - GI consulted (patient reports her diverticulitis has been so bad before she was supposed to get a partial colectomy) - pain control with fentanyl  Essential HTN -  Can restart home medications of hydrochlorothiazide and coreg   GERD - IV protonix  IBS - has had diarrhea since taking Linzess three days ago - no diarrhea or constipation noted     DVT prophylaxis: subcutaneous heparin Code Status:  full Code  Family Communication: no family bedside Disposition Plan: likely discharge back to previous home environment   Consultants:   Gastroenterology  Procedures:   None  Antimicrobials:   Flagyl  Vancomycin    Subjective: Patient seen this afternoon.  She reports that she feels better than when she came in; not back to baseline but can ambulate to the bathroom without significant pain.    Objective: Vitals:   08/10/16 1530 08/10/16 2034 08/10/16 2153 08/11/16 0530  BP:   (!) 141/75 (!) 144/71  Pulse: 74  77 69  Resp:   20 20  Temp:   98.9 F (37.2 C) 98.4 F (36.9 C)  TempSrc:   Oral Oral  SpO2: 94% 95% 99% 98%  Height:        Intake/Output Summary (Last 24 hours) at 08/11/16 1347 Last data filed at 08/11/16 0949  Gross per 24 hour  Intake          1541.67 ml  Output             1650 ml  Net          -108.33 ml   There were no vitals filed for this visit.  Examination:  General exam: Appears calm and comfortable  Respiratory system: Clear to auscultation. Respiratory effort normal. Cardiovascular system: S1 & S2 heard, RRR. No JVD, murmurs, rubs, gallops or clicks. No pedal edema. Gastrointestinal system: Abdomen is nondistended, soft and nontender. No organomegaly or masses  felt. Normal bowel sounds heard. Central nervous system: Alert and oriented. No focal neurological deficits. Extremities: Symmetric 5 x 5 power. Skin: No rashes, lesions or ulcers Psychiatry: Judgement and insight appear normal. Mood & affect appropriate.     Data Reviewed: I have personally reviewed following labs and imaging studies  CBC:  Recent Labs Lab 08/10/16 1041  WBC 11.5*  NEUTROABS 8.9*  HGB 13.0  HCT 37.7  MCV 85.9  PLT 273   Basic Metabolic Panel:  Recent Labs Lab 08/10/16 1041 08/11/16 0414  NA 136 136  K 3.7 3.0*  CL 102 103  CO2 26 26  GLUCOSE 110* 128*  BUN 8 5*  CREATININE 0.54 0.60  CALCIUM 9.0 8.4*   GFR: CrCl cannot be calculated  (Unknown ideal weight.). Liver Function Tests:  Recent Labs Lab 08/10/16 1041  AST 20  ALT 27  ALKPHOS 68  BILITOT 0.6  PROT 7.9  ALBUMIN 3.9    Recent Labs Lab 08/10/16 1041  LIPASE 16   No results for input(s): AMMONIA in the last 168 hours. Coagulation Profile: No results for input(s): INR, PROTIME in the last 168 hours. Cardiac Enzymes: No results for input(s): CKTOTAL, CKMB, CKMBINDEX, TROPONINI in the last 168 hours. BNP (last 3 results) No results for input(s): PROBNP in the last 8760 hours. HbA1C: No results for input(s): HGBA1C in the last 72 hours. CBG: No results for input(s): GLUCAP in the last 168 hours. Lipid Profile: No results for input(s): CHOL, HDL, LDLCALC, TRIG, CHOLHDL, LDLDIRECT in the last 72 hours. Thyroid Function Tests: No results for input(s): TSH, T4TOTAL, FREET4, T3FREE, THYROIDAB in the last 72 hours. Anemia Panel: No results for input(s): VITAMINB12, FOLATE, FERRITIN, TIBC, IRON, RETICCTPCT in the last 72 hours. Sepsis Labs: No results for input(s): PROCALCITON, LATICACIDVEN in the last 168 hours.  No results found for this or any previous visit (from the past 240 hour(s)).       Radiology Studies: Ct Abdomen Pelvis W Contrast  Result Date: 08/10/2016 CLINICAL DATA:  Four day history abdominal pain EXAM: CT ABDOMEN AND PELVIS WITH CONTRAST TECHNIQUE: Multidetector CT imaging of the abdomen and pelvis was performed using the standard protocol following bolus administration of intravenous contrast. CONTRAST:  100mL ISOVUE-300 IOPAMIDOL (ISOVUE-300) INJECTION 61% COMPARISON:  Jul 13, 2015 FINDINGS: Lower chest: Lung bases are clear. Hepatobiliary: There is a stable focus of decreased attenuation posterior segment right lobe of the liver, a likely small cyst. No new liver lesions evident. Gallbladder wall is not appreciably thickened. There is no biliary duct dilatation. Pancreas: No pancreatic mass or inflammatory focus. Spleen: No splenic  lesions are evident. Adrenals/Urinary Tract: Right adrenal appears normal. There is a 1.1 x 1.0 cm probable adenoma in the left adrenal. Kidneys bilaterally show no evident mass or hydronephrosis on either side. There is no renal or ureteral calculus on either side. Urinary bladder is midline with wall thickness within normal limits. Stomach/Bowel: There is diffuse wall thickening in the mid the distal sigmoid colon with surrounding mesenteric thickening several irregular diverticulum consistent with diverticulitis in this region. There is a small amount of loculated fluid in this area. There is no well-defined abscess in this area. No perforation evident. There are multiple diverticula in the descending colon region. There is slight mesenteric thickening in the left lower quadrant in this area, probably due to scarring from prior diverticulitis in this area. There is no appreciable bowel wall or mesenteric thickening elsewhere. No bowel obstruction. There is no demonstrable free air  or portal venous air. Vascular/Lymphatic: There are foci of atherosclerotic calcification in the aorta and bifurcation regions. No aneurysm evident. Major mesenteric vessels appear patent. There is no adenopathy in the abdomen or pelvis. Reproductive: Uterus is anteverted.  No pelvic masses evident. Other: Appendix appears unremarkable. There is no frank abscess for ascites in the abdomen or pelvis. Note that there is a small amount of loculated fluid in the area of diverticulitis. There is thinning of the rectus muscle in the midline without frank hernia. Musculoskeletal: There are no blastic or lytic bone lesions. There is no intramuscular or abdominal wall lesion. IMPRESSION: Extensive changes of diverticulitis in the distal sigmoid colon with extensive wall thickening and surrounding mesenteric thickening. This mesenteric thickening extends into the upper pelvis in the midline. There is extensive left-sided colonic diverticulosis.  In the distal descending colon region, there is probable scarring in the adjacent mesenteric prior diverticulitis. A less likely second smaller focus of diverticulitis in this area is possible but felt to be less likely. There is no perforation or frank abscess in the region of diverticulitis. There is bowel wall edema. In the earliest changes of phlegmon formation in this area cannot be entirely excluded by CT. No bowel obstruction. No abscess elsewhere in the abdomen pelvis. Appendix region appears normal. No renal or ureteral calculus.  No hydronephrosis. Mild aortic atherosclerosis. Electronically Signed   By: Bretta Bang III M.D.   On: 08/10/2016 11:54        Scheduled Meds: . heparin  5,000 Units Subcutaneous Q8H   Continuous Infusions: . ciprofloxacin Stopped (08/11/16 0500)  . metronidazole Stopped (08/11/16 1229)  . potassium chloride       LOS: 1 day    Time spent: 30 minutes    Katrinka Blazing, MD [facility] Pager [phone]  If 7PM-7AM, please contact night-coverage [email or web address] Password [email or web address] 08/11/2016, 1:47 PM

## 2016-08-11 NOTE — Progress Notes (Signed)
Initially in bedside reporting the patient stated that she did not want to take her Flagyl due to it making her causing her to feel nauseous.  Night nurse stated that she did not take the dose on her shift. After speaking with the GI MD she has agreed to take the dose. I will pre medicate her with zofran.  She also wants to drink some fluids I voiced that I will notify the MD.

## 2016-08-11 NOTE — Consult Note (Signed)
Referring Provider: No ref. provider found Primary Care Physician:  Elenora Gamma, MD Primary Gastroenterologist:  Dr. Darrick Penna  Date of Admission: 08/10/16 Date of Consultation: 08/11/16  Reason for Consultation:  Diverticulitis  HPI:  Lisa [name] is a 56 y.o. female with a past medical history of chronic pain, recurrent diverticulitis, uterine fibroids, hypertension who is well-known to our practice. She was last seen in our office 01/03/2016 for follow-up on recurrent diverticulitis, GERD, IBS symptoms. Noted 2 episodes of documented diverticulitis in 2016 and 1 of May 2017 although the patient reported a total of 5 episodes and at least 4 hospitalizations. The patient has [facility] assistance and would like to see a surgeon to consider elective partial colectomy as previously recommended. She was referred to [facility] surgical. The chart indicated at her last visit she had [facility] assistance.  The patient presented to the emergency department on 08/10/2016 complaining of left lower quadrant abdominal pain progressively worse which she feels is likely a diverticulitis flare. Also having nausea, denies vomiting. Has subjective fever and episodes of nonbloody diarrhea, abdominal distention, loss of appetite. She informs EGD provider that she has been limited in her ability to have a partial colectomy due to need for cardiac clearance (she stated she had a stress test in 2014 that showed ischemic cardiac disease). She informs a hospitalist at she has not been able to undergo her stress test because she has been denied for disability  In the emergency department her white blood cell count was elevated at 11.5. CBC otherwise normal, BMP normal. CT scan showed: "Extensive changes of diverticulitis in the distal sigmoid colon with extensive wall thickening and surrounding mesenteric thickening. This mesenteric thickening extends into the upper pelvis in the midline. There is extensive left-sided  colonic diverticulosis. In the distal descending colon region, there is probable scarring in the adjacent mesenteric prior diverticulitis. A less likely second smaller focus of diverticulitis in this area is possible but felt to be less likely. There is no perforation or frank abscess in the region of diverticulitis. There is bowel wall edema. In the earliest changes of phlegmon formation in this area cannot be entirely excluded by CT."  she was admitted, made nothing by mouth, and started on IV antibiotics.  Today she states she feels better than she did the last few days. Before today she could "barely stand up without holding her stomach." Denies hematochezia or melena. Was having nausea as weel (although this, too, is improved today.) States she didn't complete her cardiac clearance because she applied for disability 3 times and was denied all three times. I discussed her history of recurrent diverticulitis and likely remaining need for partial colectomy. Her CFA has likely expired at this point and states someone came in to help her apply but she was in the bathroom. No other GI complaints at this time.  States her IV antibiotics cause nausea and a headache.   Past Medical History:  Diagnosis Date  . Allergy   . Anemia   . Arthritis   . Asthma   . Bursitis   . Chronic back pain    Patient reports lower back disc problems  . Degenerative lumbar disc   . Diverticulitis   . Enlarged uterus 03/18/2014  . Essential hypertension, benign   . GERD (gastroesophageal reflux disease)   . High blood pressure   . High cholesterol   . Mixed hyperlipidemia   . Mixed stress and urge urinary incontinence 03/18/2014  . Myocardial  infarction [facility](HCC)    Patient reports related to "steroids" in [id number] ([place]  . PMB (postmenopausal bleeding) 03/18/2014  . Seizures (HCC)   . Stroke (HCC)   . Tendinitis    Patient reports chronic problem involving her arms and hands    Past Surgical History:  Procedure  Laterality Date  . adnoids    . CESAREAN SECTION    . COLONOSCOPY N/A 06/04/2014   SLF: 1. moderate diverticulosis in teh sigmoid colon, descending colon, and transverse colon. 2. Two colon polyps removed 3. Large external and small internal hemorroids.   . TONSILLECTOMY      Prior to Admission medications   Medication Sig Start Date End Date Taking? Authorizing Provider  albuterol (PROVENTIL HFA;VENTOLIN HFA) 108 (90 BASE) MCG/ACT inhaler Inhale 2 puffs into the lungs every 6 (six) hours as needed for wheezing.   Yes [provider]  aspirin EC 81 MG tablet Take 81 mg by mouth daily.   Yes [provider]  carvedilol (COREG) 6.25 MG tablet Take 1 tablet (6.25 mg total) by mouth 2 (two) times daily with a meal. 03/15/16  Yes Elenora GammaBradshaw, Samuel L, MD  CHARCOAL ACTIVATED PO Take 2 tablets by mouth as needed.   Yes [provider]  cholecalciferol (VITAMIN D) 1000 units tablet Take 1,000 Units by mouth daily.   Yes [provider]  Cyanocobalamin (B-12 PO) Take 1 tablet by mouth daily.   Yes [provider]  dexlansoprazole (DEXILANT) 60 MG capsule Take 1 capsule (60 mg total) by mouth daily. 01/21/16  Yes Gelene MinkBoone, Anna W, NP  DimenhyDRINATE (EQ MOTION SICKNESS PO) Apply 1 application topically as needed (for nausea/vomiting). Reported on 08/25/2015   Yes [provider]  GARLIC PO Take 1 tablet by mouth daily.   Yes [provider]  Ginger, Zingiber officinalis, (GINGER PO) Take 1 tablet by mouth daily as needed (nausea).   Yes [provider]  hydrochlorothiazide (HYDRODIURIL) 25 MG tablet Take 1 tablet (25 mg total) by mouth daily. 01/24/16  Yes Elenora GammaBradshaw, Samuel L, MD  linaclotide (LINZESS) 145 MCG CAPS capsule Take 145 mcg by mouth daily before breakfast.   Yes [provider]  methylcellulose (FIBER THERAPY) oral powder Take 1 packet by mouth 3 (three) times daily. 1 tsp daily in the morning   Yes [provider]  TURMERIC PO Take 1 tablet by mouth daily.   Yes [provider]  azithromycin (ZITHROMAX) 250 MG tablet Take 2 tablets on day 1 and 1 tablet daily after that Patient not taking: Reported on 08/10/2016 03/15/16   Elenora GammaBradshaw, Samuel L, MD    Current Facility-Administered Medications  Medication Dose Route Frequency Provider Last Rate Last Dose  . acetaminophen (TYLENOL) tablet 650 mg  650 mg Oral Q6H PRN Filbert SchilderKadolph, Alexandria U, MD       Or  . acetaminophen (TYLENOL) suppository 650 mg  650 mg Rectal Q6H PRN Filbert SchilderKadolph, Alexandria U, MD      . ciprofloxacin (CIPRO) IVPB 400 mg  400 mg Intravenous Q12H Filbert SchilderKadolph, Alexandria U, MD   Stopped at 08/11/16 0500  . fentaNYL (SUBLIMAZE) injection 50 mcg  50 mcg Intravenous Q2H PRN Filbert SchilderKadolph, Alexandria U, MD      . heparin injection 5,000 Units  5,000 Units Subcutaneous Q8H Filbert SchilderKadolph, Alexandria U, MD   5,000 Units at 08/11/16 [phone]  . metroNIDAZOLE (FLAGYL) IVPB 500 mg  500 mg Intravenous Q8H Filbert SchilderKadolph, Alexandria U, MD   Stopped at 08/10/16 1735  . ondansetron (ZOFRAN)  tablet 4 mg  4 mg Oral Q6H PRN Filbert Schilder, MD       Or  . ondansetron [facility]) injection 4 mg  4 mg Intravenous Q6H PRN Filbert Schilder, MD        Allergies as of 08/10/2016 - Review Complete 08/10/2016  Allergen Reaction Noted  . Metoprolol Nausea And Vomiting 05/27/2013  . Morphine and related Other (See Comments) 02/14/2012  . Naproxen Other (See Comments) 02/14/2012  . Tramadol Nausea And Vomiting 09/15/2013  . Ibuprofen Rash 02/14/2012  . Penicillins Nausea Only and Rash 02/14/2012    Family History  Problem Relation Age of Onset  . Adopted: Yes  . Other Mother        aneursym  . Other Maternal Uncle        stenosis  . Arthritis Maternal Uncle   . Angina Son   . ADD / ADHD Son   . Other Son        food allergies  . Blindness Daughter        from birth; brain damage  . Drug abuse Sister   . Heart attack Paternal Aunt   . Alcohol abuse Cousin   .  Drug abuse Cousin     Social History   Social History  . Marital status: Married    Spouse name: N/A  . Number of children: N/A  . Years of education: N/A   Occupational History  . Unemployed    Social History Main Topics  . Smoking status: Former Smoker    Packs/day: 2.00    Years: 12.00    Types: Cigarettes    Quit date: 03/13/1985  . Smokeless tobacco: Never Used     Comment: Quit x 10 years ago  . Alcohol use No  . Drug use: No  . Sexual activity: No   Other Topics Concern  . Not on file   Social History Narrative   GRANDFATHER DIED IN 1967/08/18 POSSIBLY DUE TO DIVERTICULITIS. [employer]. LIVING IN [place].     Review of Systems: General: Negative for anorexia, weight loss, fatigue, weakness. ENT: Negative for hoarseness, difficulty swallowing. CV: Negative for chest pain, angina, palpitations, peripheral edema.  Respiratory: Negative for dyspnea at rest, cough, sputum, wheezing.  GI: See history of present illness. MS: Admits chronic back pain.  Derm: Negative for rash or itching.  Endo: Negative for unusual weight change.  Heme: Negative for bruising or bleeding.  Physical Exam: Vital signs in last 24 hours: Temp:  [98.4 F (36.9 C)-98.9 F (37.2 C)] 98.4 F (36.9 C) (06/01 0530) Pulse Rate:  [69-83] 69 (06/01 0530) Resp:  [18-20] 20 (06/01 0530) BP: (128-188)/(63-95) 144/71 (06/01 0530) SpO2:  [87 %-99 %] 98 % (06/01 0530)   General:   Alert,  Well-developed, well-nourished, pleasant and cooperative in NAD. Does not appear overtly toxic. Head:  Normocephalic and atraumatic. Eyes:  Sclera clear, no icterus.Conjunctiva pink. Ears:  Normal auditory acuity. Neck:  Supple; no masses or thyromegaly. Lungs:  Clear throughout to auscultation. No wheezes, crackles, or rhonchi. No acute distress. Heart:  Regular rate and rhythm; no murmurs, clicks, rubs,  or gallops. Abdomen:  Soft, and nondistended. Moderate generalized TTP. No masses,  hepatosplenomegaly or hernias noted. Normal bowel sounds, without guarding, and without rebound.   Rectal:  Deferred.   Msk:  Symmetrical without gross deformities. Pulses:  Normal bilateral pedal pulses noted. Extremities:  Without clubbing or edema. Neurologic:  Alert and  oriented x4;  grossly  normal neurologically. Psych:  Alert and cooperative. Normal mood and affect.  Intake/Output from previous day: 05/31 0701 - 06/01 0700 In: 1841.7 [I.V.:1041.7; IV Piggyback:800] Out: 1250 [Urine:1250] Intake/Output this shift: No intake/output data recorded.  Lab Results:  Recent Labs  08/10/16 1041  WBC 11.5*  HGB 13.0  HCT 37.7  PLT 273   BMET  Recent Labs  08/10/16 1041 08/11/16 0414  NA 136 136  K 3.7 3.0*  CL 102 103  CO2 26 26  GLUCOSE 110* 128*  BUN 8 5*  CREATININE 0.54 0.60  CALCIUM 9.0 8.4*   LFT  Recent Labs  08/10/16 1041  PROT 7.9  ALBUMIN 3.9  AST 20  ALT 27  ALKPHOS 68  BILITOT 0.6   PT/INR No results for input(s): LABPROT, INR in the last 72 hours. Hepatitis Panel No results for input(s): HEPBSAG, HCVAB, HEPAIGM, HEPBIGM in the last 72 hours. C-Diff No results for input(s): CDIFFTOX in the last 72 hours.  Studies/Results: Ct Abdomen Pelvis W Contrast  Result Date: 08/10/2016 CLINICAL DATA:  Four day history abdominal pain EXAM: CT ABDOMEN AND PELVIS WITH CONTRAST TECHNIQUE: Multidetector CT imaging of the abdomen and pelvis was performed using the standard protocol following bolus administration of intravenous contrast. CONTRAST:  ISOVUE-300 IOPAMIDOL (ISOVUE-300) INJECTION 61% COMPARISON:  Jul 13, 2015 FINDINGS: Lower chest: Lung bases are clear. Hepatobiliary: There is a stable focus of decreased attenuation posterior segment right lobe of the liver, a likely small cyst. No new liver lesions evident. Gallbladder wall is not appreciably thickened. There is no biliary duct dilatation. Pancreas: No pancreatic mass or inflammatory focus.  Spleen: No splenic lesions are evident. Adrenals/Urinary Tract: Right adrenal appears normal. There is a 1.1 x 1.0 cm probable adenoma in the left adrenal. Kidneys bilaterally show no evident mass or hydronephrosis on either side. There is no renal or ureteral calculus on either side. Urinary bladder is midline with wall thickness within normal limits. Stomach/Bowel: There is diffuse wall thickening in the mid the distal sigmoid colon with surrounding mesenteric thickening several irregular diverticulum consistent with diverticulitis in this region. There is a small amount of loculated fluid in this area. There is no well-defined abscess in this area. No perforation evident. There are multiple diverticula in the descending colon region. There is slight mesenteric thickening in the left lower quadrant in this area, probably due to scarring from prior diverticulitis in this area. There is no appreciable bowel wall or mesenteric thickening elsewhere. No bowel obstruction. There is no demonstrable free air or portal venous air. Vascular/Lymphatic: There are foci of atherosclerotic calcification in the aorta and bifurcation regions. No aneurysm evident. Major mesenteric vessels appear patent. There is no adenopathy in the abdomen or pelvis. Reproductive: Uterus is anteverted.  No pelvic masses evident. Other: Appendix appears unremarkable. There is no frank abscess for ascites in the abdomen or pelvis. Note that there is a small amount of loculated fluid in the area of diverticulitis. There is thinning of the rectus muscle in the midline without frank hernia. Musculoskeletal: There are no blastic or lytic bone lesions. There is no intramuscular or abdominal wall lesion. IMPRESSION: Extensive changes of diverticulitis in the distal sigmoid colon with extensive wall thickening and surrounding mesenteric thickening. This mesenteric thickening extends into the upper pelvis in the midline. There is extensive left-sided  colonic diverticulosis. In the distal descending colon region, there is probable scarring in the adjacent mesenteric prior diverticulitis. A less likely second smaller focus of diverticulitis in this  area is possible but felt to be less likely. There is no perforation or frank abscess in the region of diverticulitis. There is bowel wall edema. In the earliest changes of phlegmon formation in this area cannot be entirely excluded by CT. No bowel obstruction. No abscess elsewhere in the abdomen pelvis. Appendix region appears normal. No renal or ureteral calculus.  No hydronephrosis. Mild aortic atherosclerosis. Electronically Signed   By: Bretta Bang III M.D.   On: 08/10/2016 11:54    Impression: 56 year old female well known to our practice with a history of at least 5 episodes of divrticulitis who presented for abdominal pain and nausea with documented diverticulitis and changes consistent with chronic recurrent diverticulitis on CT imaging. WBC cound elevated mildly, unable to rule out early phlegmon. She has not followed through with recommendation for outpatient partial colectomy because of not getting approved for disability (although she did have CFA). She will likely continue to have recurrent diverticulitis.   She does not appear toxic today. States she is clinically improved. Abdominal pain persists, however. Has nausea ongoing which is, again, improved. Having headache and nausea with IV antibiotics. Remains NPO. On appropriate therapy for diverticulitis  Plan: 1. Social worker to help with CFA application 2. Continue antibiotics 3. Can consider advancing diet as tolerated/when symptoms improve 4. Long term plan: surgical re-evaluation with any necessary clearances (ie- cardiac) 5. We will continue to follow for symptomatic improvement 6. Supportive measures 7. Headache and nausea ADE management per hospitalist   Thank you for allowing [place] to participate in the care of [facility]  Wynne Dust, DNP, AGNP-C Adult & Gerontological Nurse Practitioner [facility]    LOS: 1 day     08/11/2016, 9:49 AM

## 2016-08-12 DIAGNOSIS — K5732 Diverticulitis of large intestine without perforation or abscess without bleeding: Secondary | ICD-10-CM

## 2016-08-12 NOTE — Progress Notes (Signed)
  Subjective: Patient says she feels much better today. She is able to move around without too much difficulty. She felt bloated after eating clear liquid breakfast. She did not experience nausea vomiting. She [facility]. She is passing flatus but hasn't had a BM today. She states she was hospitalized for 2 weeks when she had her first episode possibly in 2011.    Objective: Blood pressure 121/60, pulse 62, temperature 98.5 F (36.9 C), temperature source Oral, resp. rate 18, height 5' (1.524 m), last menstrual period 12/26/2011, SpO2 99 %. Patient is alert and in no acute distress. Abdomen is full. Bowel sounds are present. On palpation abdomen is soft with generalized tenderness which is most pronounced in left lower quadrant with some guarding. No organomegaly or masses. No LE edema or clubbing noted.  Labs/studies Results:   Recent Labs  08/10/16 1041  WBC 11.5*  HGB 13.0  HCT 37.7  PLT 273    BMET   Recent Labs  08/10/16 1041 08/11/16 0414  NA 136 136  K 3.7 3.0*  CL 102 103  CO2 26 26  GLUCOSE 110* 128*  BUN 8 5*  CREATININE 0.54 0.60  CALCIUM 9.0 8.4*    LFT   Recent Labs  08/10/16 1041  PROT 7.9  ALBUMIN 3.9  AST 20  ALT 27  ALKPHOS 68  BILITOT 0.6    Abdominopelvic CT from 08/10/2016 reviewed.  Assessment:  #1. Sigmoid diverticulitis. This is one of her many episodes since 2011. She has been hospitalized at least 3 different times. She has not proceeded with sigmoid colon resection primarily because of lack of insurance she states. She needs to proceed with surgery when she recovers from this episode. She is at risk for perforation and or abscess. She is tolerating clear liquids.  Recommendations:  CBC in a.m. If she does well with clear liquids today diet could be advanced to full liquids tomorrow. Continue IV Cipro and metronidazole for now.

## 2016-08-12 NOTE — Progress Notes (Signed)
PROGRESS NOTE    Lisa [name]  ZOX:[id number] DOB: 1960-12-28 DOA: 08/10/2016 PCP: Elenora Gamma, MD    Brief Narrative:   Lisa [name] is a 56 y.o. female with medical history significant of ischemic heart disease, recurrent diverticulitis (patient reports that a year ago she was told that she should get a partial colectomy for this),  GERD, HTN that presents with abdominal pain.  Patient reports she has had pain that began 4 days ago that has worsened over the past 4 days.  Her last flare was an unspecified amount of time ago.  Patient reports that she gets about 2-3 flares a year.  She sees Dr. Darrick Penna outpatient for her gastroenterology.  She reports that she was to get a stress test prior to this procedure though because she had a stress test in 2014 that showed ischemic cardiac disease.  She mentions that there is concern that her cardiac disease would not allow her to undergo the partial colectomy.  She mentions that she has been denied for disability and therefore has not been able to get her stress test.  Patient was found to have diverticulitis and was started on IV Cipro and Flagyl.  Gastroenterology consulted given her extensive history.     Assessment & Plan:   Principal Problem:   Diverticulitis large intestine Active Problems:   Essential hypertension, benign   Chronic ischemic heart disease   GERD (gastroesophageal reflux disease)   IBS (irritable bowel syndrome)   Internal and external hemorrhoids without complication   Acute diverticulitis   Left lower quadrant pain   Nausea without vomiting   Diverticulitis - continue clear liquids - can consider advancing diet tomorrow - Cipro and Flagyl IV - GI consulted (patient reports her diverticulitis has been so bad before she was supposed to get a partial colectomy) - pain control with fentanyl - patient will need to follow up outpatient for partial colectomy given recurrence of diverticulitis  Essential HTN -   Can restart home medications of hydrochlorothiazide and coreg - continue to monitor BP - add PRN hydralazine if needed  GERD - IV protonix  IBS - no diarrhea noted - passing gas   DVT prophylaxis: subcutaneous heparin Code Status: full Code  Family Communication: husband Peyton Najjar is bedside Disposition Plan: likely discharge back to previous home environment   Consultants:   Gastroenterology  Procedures:   None  Antimicrobials:   Flagyl  Vancomycin    Subjective: Patient says she has been ambulating around her room without much difficulty.  Reports that her pain is generally well controlled except after eating jello.  Reports that otherwise she feels slightly improved.  Wants to get scheduled for surgery soon.   Objective: Vitals:   08/11/16 1449 08/11/16 2157 08/12/16 0619 08/12/16 1500  BP: (!) 155/76 (!) 159/84 121/60 (!) 171/89  Pulse: 62 63 62 (!) 57  Resp: 18 18 18 18   Temp: 98 F (36.7 C) 98.4 F (36.9 C) 98.5 F (36.9 C) 98.3 F (36.8 C)  TempSrc: Oral Oral Oral   SpO2: 97% 100% 99% 96%  Height:        Intake/Output Summary (Last 24 hours) at 08/12/16 1730 Last data filed at 08/12/16 1540  Gross per 24 hour  Intake             1480 ml  Output                0 ml  Net  1480 ml   There were no vitals filed for this visit.  Examination:  General exam: Appears calm and comfortable  Respiratory system: Clear to auscultation. Respiratory effort normal. Cardiovascular system: S1 & S2 heard, RRR. No JVD, murmurs, rubs, gallops or clicks. No pedal edema. Gastrointestinal system: Abdomen is nondistended, soft and diffusely tender mostly in LLQ. No organomegaly or masses felt. Normal bowel sounds heard. Central nervous system: Alert and oriented. No focal neurological deficits. Extremities: Symmetric 5 x 5 power. Skin: No rashes, lesions or ulcers Psychiatry: Mood & affect appropriate    Data Reviewed: I have personally reviewed  following labs and imaging studies  CBC:  Recent Labs Lab 08/10/16 1041  WBC 11.5*  NEUTROABS 8.9*  HGB 13.0  HCT 37.7  MCV 85.9  PLT 273   Basic Metabolic Panel:  Recent Labs Lab 08/10/16 1041 08/11/16 0414  NA 136 136  K 3.7 3.0*  CL 102 103  CO2 26 26  GLUCOSE 110* 128*  BUN 8 5*  CREATININE 0.54 0.60  CALCIUM 9.0 8.4*   GFR: CrCl cannot be calculated (Unknown ideal weight.). Liver Function Tests:  Recent Labs Lab 08/10/16 1041  AST 20  ALT 27  ALKPHOS 68  BILITOT 0.6  PROT 7.9  ALBUMIN 3.9    Recent Labs Lab 08/10/16 1041  LIPASE 16   No results for input(s): AMMONIA in the last 168 hours. Coagulation Profile: No results for input(s): INR, PROTIME in the last 168 hours. Cardiac Enzymes: No results for input(s): CKTOTAL, CKMB, CKMBINDEX, TROPONINI in the last 168 hours. BNP (last 3 results) No results for input(s): PROBNP in the last 8760 hours. HbA1C: No results for input(s): HGBA1C in the last 72 hours. CBG: No results for input(s): GLUCAP in the last 168 hours. Lipid Profile: No results for input(s): CHOL, HDL, LDLCALC, TRIG, CHOLHDL, LDLDIRECT in the last 72 hours. Thyroid Function Tests: No results for input(s): TSH, T4TOTAL, FREET4, T3FREE, THYROIDAB in the last 72 hours. Anemia Panel: No results for input(s): VITAMINB12, FOLATE, FERRITIN, TIBC, IRON, RETICCTPCT in the last 72 hours. Sepsis Labs: No results for input(s): PROCALCITON, LATICACIDVEN in the last 168 hours.  No results found for this or any previous visit (from the past 240 hour(s)).       Radiology Studies: No results found.      Scheduled Meds: . carvedilol  6.25 mg Oral BID WC  . heparin  5,000 Units Subcutaneous Q8H  . hydrochlorothiazide  25 mg Oral Daily   Continuous Infusions: . ciprofloxacin Stopped (08/12/16 1640)  . metronidazole Stopped (08/12/16 1203)     LOS: 2 days    Time spent: 30 minutes    Katrinka BlazingAlex U Kadolph, MD [facility] Pager [phone]  If 7PM-7AM, please contact night-coverage [email or web address] Password [email or web address] 08/12/2016, 5:30 PM

## 2016-08-13 MED ORDER — PROMETHAZINE HCL 12.5 MG PO TABS: 25.0000 mg | ORAL_TABLET | Freq: Four times a day (QID) | ORAL | Status: DC | PRN

## 2016-08-13 MED ORDER — PROMETHAZINE HCL 25 MG/ML IJ SOLN
6.2500 mg | Freq: Four times a day (QID) | INTRAMUSCULAR | Status: DC | PRN
  Administered 2016-08-13 (×2): 6.25 mg via INTRAVENOUS
  Filled 2016-08-13 (×3): qty 1

## 2016-08-13 MED ORDER — SODIUM CHLORIDE 0.9 % IV SOLN
INTRAVENOUS | Status: DC
  Administered 2016-08-13 – 2016-08-14 (×3): via INTRAVENOUS

## 2016-08-13 MED ORDER — PANTOPRAZOLE SODIUM 40 MG IV SOLR
40.0000 mg | INTRAVENOUS | Status: DC
  Administered 2016-08-13 – 2016-08-14 (×2): 40 mg via INTRAVENOUS
  Filled 2016-08-13 (×2): qty 40

## 2016-08-13 MED ORDER — PROMETHAZINE HCL 25 MG RE SUPP: 12.5000 mg | Freq: Four times a day (QID) | RECTAL | Status: DC | PRN

## 2016-08-13 NOTE — Progress Notes (Signed)
PROGRESS NOTE    Lisa [name]  ZOX:[id number] DOB: 12-Sep-1960 DOA: 08/10/2016 PCP: Elenora Gamma, MD    Brief Narrative:   Lisa [name] is a 56 y.o. female with medical history significant of ischemic heart disease, recurrent diverticulitis (patient reports that a year ago she was told that she should get a partial colectomy for this),  GERD, HTN that presents with abdominal pain.  Patient reports she has had pain that began 4 days ago that has worsened over the past 4 days.  Her last flare was an unspecified amount of time ago.  Patient reports that she gets about 2-3 flares a year.  She sees Dr. Darrick Penna outpatient for her gastroenterology.  She reports that she was to get a stress test prior to this procedure though because she had a stress test in 2014 that showed ischemic cardiac disease.  She mentions that there is concern that her cardiac disease would not allow her to undergo the partial colectomy.  She mentions that she has been denied for disability and therefore has not been able to get her stress test.  Patient was found to have diverticulitis and was started on IV Cipro and Flagyl.  Gastroenterology consulted given her extensive history.     Assessment & Plan:   Principal Problem:   Diverticulitis large intestine Active Problems:   Essential hypertension, benign   Chronic ischemic heart disease   GERD (gastroesophageal reflux disease)   IBS (irritable bowel syndrome)   Internal and external hemorrhoids without complication   Acute diverticulitis   Left lower quadrant pain   Nausea without vomiting   Diverticulitis - place NPO due to vomiting - restart IVF at 44ml/hr - Cipro and Flagyl IV - GI consulted (patient reports her diverticulitis has been so bad before she was supposed to get a partial colectomy) - pain control with fentanyl - patient will need to follow up outpatient for partial colectomy given recurrence of diverticulitis - may end up rescanning abdomen  pending clinical course  Hypokalemia - pending BMP - will need to give replacement IV if low again  Essential HTN -  Holding BP medications - continue to monitor BP - BP well controlled  GERD - IV protonix  IBS - no diarrhea noted - passing gas   DVT prophylaxis: subcutaneous heparin Code Status: full Code  Family Communication: no family bedside Disposition Plan: likely discharge back to previous home environment   Consultants:   Gastroenterology  Procedures:   None  Antimicrobials:   Flagyl  Vancomycin    Subjective:  Patient crying this morning.  States she has been vomiting since yesterday afternoon.  Very upset that she feels like she is going backwards.  Asking if she can go on soft diet to settle her stomach.  Objective: Vitals:   08/12/16 1500 08/12/16 1900 08/12/16 2104 08/13/16 0531  BP: (!) 171/89  (!) 185/86 116/71  Pulse: (!) 57  (!) 54 61  Resp: 18  18 18   Temp: 98.3 F (36.8 C)  97.9 F (36.6 C) 98.7 F (37.1 C)  TempSrc:   Oral Oral  SpO2: 96%  97% 100%  Height:  5' (1.524 m)      Intake/Output Summary (Last 24 hours) at 08/13/16 1126 Last data filed at 08/13/16 0321  Gross per 24 hour  Intake             1180 ml  Output  0 ml  Net             1180 ml   There were no vitals filed for this visit.  Examination:  General exam: Mild distress  Respiratory system: Clear to auscultation. Respiratory effort normal. Cardiovascular system: S1 & S2 heard, RRR. No JVD, murmurs, rubs, gallops or clicks. No pedal edema. Gastrointestinal system: Normal bowel sounds heard. Tender in epigastrium and left lower quadrant.   Central nervous system: Alert and oriented. No focal neurological deficits. Extremities: Symmetric 5 x 5 power. Skin: No rashes, lesions or ulcers Psychiatry: Crying, emotional throughout exam    Data Reviewed: I have personally reviewed following labs and imaging studies  CBC:  Recent Labs Lab  08/10/16 1041  WBC 11.5*  NEUTROABS 8.9*  HGB 13.0  HCT 37.7  MCV 85.9  PLT 273   Basic Metabolic Panel:  Recent Labs Lab 08/10/16 1041 08/11/16 0414  NA 136 136  K 3.7 3.0*  CL 102 103  CO2 26 26  GLUCOSE 110* 128*  BUN 8 5*  CREATININE 0.54 0.60  CALCIUM 9.0 8.4*   GFR: CrCl cannot be calculated (Unknown ideal weight.). Liver Function Tests:  Recent Labs Lab 08/10/16 1041  AST 20  ALT 27  ALKPHOS 68  BILITOT 0.6  PROT 7.9  ALBUMIN 3.9    Recent Labs Lab 08/10/16 1041  LIPASE 16   No results for input(s): AMMONIA in the last 168 hours. Coagulation Profile: No results for input(s): INR, PROTIME in the last 168 hours. Cardiac Enzymes: No results for input(s): CKTOTAL, CKMB, CKMBINDEX, TROPONINI in the last 168 hours. BNP (last 3 results) No results for input(s): PROBNP in the last 8760 hours. HbA1C: No results for input(s): HGBA1C in the last 72 hours. CBG: No results for input(s): GLUCAP in the last 168 hours. Lipid Profile: No results for input(s): CHOL, HDL, LDLCALC, TRIG, CHOLHDL, LDLDIRECT in the last 72 hours. Thyroid Function Tests: No results for input(s): TSH, T4TOTAL, FREET4, T3FREE, THYROIDAB in the last 72 hours. Anemia Panel: No results for input(s): VITAMINB12, FOLATE, FERRITIN, TIBC, IRON, RETICCTPCT in the last 72 hours. Sepsis Labs: No results for input(s): PROCALCITON, LATICACIDVEN in the last 168 hours.  No results found for this or any previous visit (from the past 240 hour(s)).       Radiology Studies: No results found.      Scheduled Meds: . carvedilol  6.25 mg Oral BID WC  . heparin  5,000 Units Subcutaneous Q8H  . hydrochlorothiazide  25 mg Oral Daily   Continuous Infusions: . ciprofloxacin Stopped (08/13/16 0421)  . metronidazole Stopped (08/13/16 1059)     LOS: 3 days    Time spent: 30 minutes    Katrinka BlazingAlex U Kadolph, MD [facility] Pager [phone]  If 7PM-7AM, please contact  night-coverage [email or web address] Password [email or web address] 08/13/2016, 11:26 AM

## 2016-08-13 NOTE — Progress Notes (Signed)
  Subjective:  Patient began to have nausea and vomiting this morning. She is not happy that she has been made nothing by mouth. She believes nausea and vomiting occurred because she had to take her medications on empty stomach. She believes she has a very sensitive stomach. She states most of her pain is in upper abdomen and believe it is due to heaving and vomiting. She also complains of heartburn. She states she is been keeping her head and propped up. He has been passing flatus but has not had a BM.  Objective: Blood pressure 116/71, pulse 61, temperature 98.7 F (37.1 C), temperature source Oral, resp. rate 18, height 5' (1.524 m), last menstrual period 12/26/2011, SpO2 100 %. Patient is alert and appears to be comfortable. Abdomen is full. Bowel sounds are hyperactive. On palpation abdomen is soft with generalized tenderness. She is somewhat more tenderness at LLQ with some guarding but no rebound. No LE edema or clubbing noted.  Labs/studies Results:   BMET   Recent Labs  08/11/16 0414  NA 136  K 3.0*  CL 103  CO2 26  GLUCOSE 128*  BUN 5*  CREATININE 0.60  CALCIUM 8.4*      Assessment:  #1.Sigmoid diverticulitis. Symptomatically she is improving but now she has developed nausea and vomiting which is possibly due to diverticulitis but may be secondary to medications. Abdominal exam does not suggest worsening diverticulitis. Agree with making patient nothing by mouth for now. #2. GERD.  Recommendations:  Pantoprazole 40 mg IV every 24 hours. If anti-emetics do not help will obtain acute abdominal series.

## 2016-08-14 ENCOUNTER — Encounter: Payer: Self-pay | Admitting: Cardiology

## 2016-08-14 LAB — BASIC METABOLIC PANEL
Anion gap: 11 (ref 5–15)
BUN: 6 mg/dL (ref 6–20)
CO2: 25 mmol/L (ref 22–32)
Calcium: 9.1 mg/dL (ref 8.9–10.3)
Chloride: 97 mmol/L — ABNORMAL LOW (ref 101–111)
Creatinine, Ser: 0.71 mg/dL (ref 0.44–1.00)
GFR calc Af Amer: >60 mL/min (ref >60)
GFR calc non Af Amer: >60 mL/min (ref >60)
GLUCOSE: 81 mg/dL (ref 65–99)
POTASSIUM: 2.8 mmol/L — AB (ref 3.5–5.1)
Sodium: 133 mmol/L — ABNORMAL LOW (ref 135–145)

## 2016-08-14 LAB — CBC
HEMATOCRIT: 34.1 % — AB (ref 36.0–46.0)
Hemoglobin: 12.1 g/dL (ref 12.0–15.0)
MCH: 29.4 pg (ref 26.0–34.0)
MCHC: 35.5 g/dL (ref 30.0–36.0)
MCV: 82.8 fL (ref 78.0–100.0)
Platelets: 295 10*3/uL (ref 150–400)
RBC: 4.12 MIL/uL (ref 3.87–5.11)
RDW: 12.4 % (ref 11.5–15.5)
WBC: 4.5 10*3/uL (ref 4.0–10.5)

## 2016-08-14 MED ORDER — ONDANSETRON HCL 4 MG/2ML IJ SOLN
4.0000 mg | Freq: Three times a day (TID) | INTRAMUSCULAR | Status: DC
  Administered 2016-08-14 – 2016-08-15 (×4): 4 mg via INTRAVENOUS
  Filled 2016-08-14 (×5): qty 2

## 2016-08-14 MED ORDER — PANTOPRAZOLE SODIUM 40 MG PO TBEC
40.0000 mg | DELAYED_RELEASE_TABLET | Freq: Every day | ORAL | Status: DC
  Administered 2016-08-15 – 2016-08-16 (×2): 40 mg via ORAL
  Filled 2016-08-14 (×2): qty 1

## 2016-08-14 NOTE — Progress Notes (Signed)
PROGRESS NOTE    Lisa HaleFrances M [name]  RUE:[id number] DOB: [birth year] DOA: 08/10/2016 PCP: Elenora GammaBradshaw, Samuel L, MD    Brief Narrative:   Lisa [name] is a 56 y.o. female with medical history significant of ischemic heart disease, recurrent diverticulitis (patient reports that a year ago she was told that she should get a partial colectomy for this),  GERD, HTN that presents with abdominal pain.  Patient reports she has had pain that began 4 days ago that has worsened over the past 4 days.  Her last flare was an unspecified amount of time ago.  Patient reports that she gets about 2-3 flares a year.  She sees Dr. Darrick PennaFields outpatient for her gastroenterology.  She reports that she was to get a stress test prior to this procedure though because she had a stress test in 2014 that showed ischemic cardiac disease.  She mentions that there is concern that her cardiac disease would not allow her to undergo the partial colectomy.  She mentions that she has been denied for disability and therefore has not been able to get her stress test.  Patient was found to have diverticulitis and was started on IV Cipro and Flagyl.  Gastroenterology consulted given her extensive history.     Assessment & Plan:   Principal Problem:   Diverticulitis large intestine Active Problems:   Essential hypertension, benign   Chronic ischemic heart disease   GERD (gastroesophageal reflux disease)   IBS (irritable bowel syndrome)   Internal and external hemorrhoids without complication   Acute diverticulitis   Left lower quadrant pain   Nausea without vomiting   Diverticulitis - starting clear liquids again - d/c IVF - Cipro and Flagyl IV - GI consulted (patient reports her diverticulitis has been so bad before she was supposed to get a partial colectomy) - pain control with fentanyl - patient will need to follow up outpatient for partial colectomy given recurrence of diverticulitis - will d/c phenergan given patient's concern  of dizziness  Hypokalemia - unable to get BMP yesterday  - repeat BMP ordered for today - will need to give replacement IV if low again  Essential HTN - need to restart home BP medications - continue to monitor BP - BP well controlled  GERD - IV protonix  IBS - no diarrhea noted - passing gas   DVT prophylaxis: subcutaneous heparin Code Status: full Code  Family Communication: no family bedside Disposition Plan: likely discharge back to previous home environment possibly in 1-2 days   Consultants:   Gastroenterology  Procedures:   None  Antimicrobials:   Flagyl  Vancomycin    Subjective: Patient sitting in chair.  She says she feels better today than yesterday but not back to baseline.  Reports she has not had any more vomiting since yesterday.  Abdominal pain is improved.  Objective: Vitals:   08/13/16 1530 08/13/16 2131 08/13/16 2150 08/14/16 0542  BP: 137/84  139/72 (!) 163/82  Pulse: (!) 56  60 61  Resp: 18  18 18   Temp: 98.3 F (36.8 C)  98.7 F (37.1 C) 98.4 F (36.9 C)  TempSrc:   Oral Oral  SpO2: 98%  99% 98%  Height:  5' (1.524 m)      Intake/Output Summary (Last 24 hours) at 08/14/16 1148 Last data filed at 08/14/16 0857  Gross per 24 hour  Intake             2405 ml  Output  0 ml  Net             2405 ml   There were no vitals filed for this visit.  Examination:  General exam: Appears comfortable Respiratory system: Clear to auscultation. Respiratory effort normal. Cardiovascular system:S1 &S2 heard, RRR. No JVD, murmurs, rubs, gallops or clicks. No pedal edema. Gastrointestinal system:Normal bowel sounds heard. Tender in left lower quadrant but improved from yesterday. Central nervous system:Alert and oriented. No focal neurological deficits. Extremities: Symmetric 5 x 5 power. Skin: No rashes, lesions or ulcers Psychiatry:appropriate mood and affect    Data Reviewed: I have personally reviewed  following labs and imaging studies  CBC:  Recent Labs Lab 08/10/16 1041 08/13/16 2324  WBC 11.5* 4.5  NEUTROABS 8.9*  --   HGB 13.0 12.1  HCT 37.7 34.1*  MCV 85.9 82.8  PLT 273 295   Basic Metabolic Panel:  Recent Labs Lab 08/10/16 1041 08/11/16 0414  NA 136 136  K 3.7 3.0*  CL 102 103  CO2 26 26  GLUCOSE 110* 128*  BUN 8 5*  CREATININE 0.54 0.60  CALCIUM 9.0 8.4*   GFR: CrCl cannot be calculated (Unknown ideal weight.). Liver Function Tests:  Recent Labs Lab 08/10/16 1041  AST 20  ALT 27  ALKPHOS 68  BILITOT 0.6  PROT 7.9  ALBUMIN 3.9    Recent Labs Lab 08/10/16 1041  LIPASE 16   No results for input(s): AMMONIA in the last 168 hours. Coagulation Profile: No results for input(s): INR, PROTIME in the last 168 hours. Cardiac Enzymes: No results for input(s): CKTOTAL, CKMB, CKMBINDEX, TROPONINI in the last 168 hours. BNP (last 3 results) No results for input(s): PROBNP in the last 8760 hours. HbA1C: No results for input(s): HGBA1C in the last 72 hours. CBG: No results for input(s): GLUCAP in the last 168 hours. Lipid Profile: No results for input(s): CHOL, HDL, LDLCALC, TRIG, CHOLHDL, LDLDIRECT in the last 72 hours. Thyroid Function Tests: No results for input(s): TSH, T4TOTAL, FREET4, T3FREE, THYROIDAB in the last 72 hours. Anemia Panel: No results for input(s): VITAMINB12, FOLATE, FERRITIN, TIBC, IRON, RETICCTPCT in the last 72 hours. Sepsis Labs: No results for input(s): PROCALCITON, LATICACIDVEN in the last 168 hours.  No results found for this or any previous visit (from the past 240 hour(s)).       Radiology Studies: No results found.      Scheduled Meds: . carvedilol  6.25 mg Oral BID WC  . heparin  5,000 Units Subcutaneous Q8H  . hydrochlorothiazide  25 mg Oral Daily  . pantoprazole (PROTONIX) IV  40 mg Intravenous Q24H   Continuous Infusions: . sodium chloride 75 mL/hr at 08/14/16 0234  . ciprofloxacin Stopped (08/14/16  0334)  . metronidazole Stopped (08/14/16 1217)     LOS: 4 days    Time spent: 30 minutes    Katrinka Blazing, MD [facility] Pager [phone]  If 7PM-7AM, please contact night-coverage [email or web address] Password [email or web address] 08/14/2016, 11:48 AM

## 2016-08-14 NOTE — Care Management Note (Signed)
Case Management Note  Patient Details  Name: Lisa [name] MRN: [id number] Date of Birth: 25-Sep-1960  Subjective/Objective:                  Admitted with diverticulitis. Pt from home with husband, she is ind with ADL's. She has PCP - Kevin FentonSamuel Bradshaw. [facility] She has no DME/HH needs. She plans to apply for disability, pt attempting this last year during hospitalization also.  Action/Plan: Pt plans to return home with self care. No needs anticipated, [facility] Pt given number for [place] [employer] to discuss disability process. CM will follow to DC.   Expected Discharge Date:      08/15/2016            Expected Discharge Plan:  Home/Self Care  In-House Referral:  Financial Counselor  Discharge planning Services  CM Consult  Post Acute Care Choice:  NA Choice offered to:  NA  Status of Service:  In process, will continue to follow  Malcolm MetroChildress, Temika Sutphin Demske, RN 08/14/2016, 1:11 PM

## 2016-08-14 NOTE — Progress Notes (Signed)
    Subjective: Feels like the Phenergan is making her dizzy. Not nauseated right now. Will use ginger to settle her stomach at home. Doesn't like using medications. Feels like taking oral medications on an empty stomach precipitated vomiting. This morning, she is without N/V. Abdominal pain improved.   Objective: Vital signs in last 24 hours: Temp:  [98.3 F (36.8 C)-98.7 F (37.1 C)] 98.4 F (36.9 C) (06/04 0542) Pulse Rate:  [56-61] 61 (06/04 0542) Resp:  [18] 18 (06/04 0542) BP: (137-163)/(72-84) 163/82 (06/04 0542) SpO2:  [98 %-99 %] 98 % (06/04 0542) Last BM Date: 08/09/16 General:   Alert and oriented, flat affect  Head:  Normocephalic and atraumatic. Eyes:  No icterus, sclera clear. Conjuctiva pink.  Abdomen:  Bowel sounds present, soft, mild TTP LLQ, non-distended. No HSM or hernias noted. No rebound or guarding. No masses appreciated  Msk:  Symmetrical without gross deformities. Normal posture. Extremities:  Without edema. Neurologic:  Alert and  oriented x4 Psych:  Alert and cooperative. Normal mood and affect.  Intake/Output from previous day: 06/03 0701 - 06/04 0700 In: 2165 [P.O.:120; I.V.:1345; IV Piggyback:700] Out: 2 [Emesis/NG output:2] Intake/Output this shift: No intake/output data recorded.  Lab Results:  Recent Labs  08/13/16 2324  WBC 4.5  HGB 12.1  HCT 34.1*  PLT 295   Assessment: 56 year old female admitted with sigmoid diverticulitis and complicated by N/V yesterday. NPO currently. PPI added yesterday due to GERD symptoms. Clinically, she has improved, stating abdominal pain is much improved and N/V resolved. Patient is quite convinced that N/V was precipitated by oral medications on an empty stomach. Will trial clear liquids again this morning. If she is unable to advance her diet, she will need repeat abdominal imaging.   Plan: Clear liquids Continue Protonix IV and switch to oral if she tolerates diet advancement Monitor clinically Further  imaging if unable to advance diet   Gelene MinkAnna W. Loreena Valeri, PhD, ANP-BC [facility]    LOS: 4 days    08/14/2016, 8:09 AM

## 2016-08-15 ENCOUNTER — Encounter: Payer: Self-pay | Admitting: Gastroenterology

## 2016-08-15 ENCOUNTER — Telehealth: Payer: Self-pay | Admitting: Gastroenterology

## 2016-08-15 ENCOUNTER — Other Ambulatory Visit: Payer: Self-pay

## 2016-08-15 ENCOUNTER — Encounter: Payer: Self-pay | Admitting: Cardiology

## 2016-08-15 DIAGNOSIS — K5732 Diverticulitis of large intestine without perforation or abscess without bleeding: Secondary | ICD-10-CM

## 2016-08-15 MED ORDER — MAGNESIUM SULFATE 2 GM/50ML IV SOLN
2.0000 g | Freq: Once | INTRAVENOUS | Status: AC
  Administered 2016-08-15: 2 g via INTRAVENOUS
  Filled 2016-08-15: qty 50

## 2016-08-15 MED ORDER — METRONIDAZOLE 500 MG PO TABS
500.0000 mg | ORAL_TABLET | Freq: Three times a day (TID) | ORAL | Status: DC
  Administered 2016-08-15 – 2016-08-16 (×3): 500 mg via ORAL
  Filled 2016-08-15 (×3): qty 1

## 2016-08-15 MED ORDER — CIPROFLOXACIN HCL 250 MG PO TABS
500.0000 mg | ORAL_TABLET | Freq: Two times a day (BID) | ORAL | Status: DC
  Administered 2016-08-15 – 2016-08-16 (×3): 500 mg via ORAL
  Filled 2016-08-15 (×3): qty 2

## 2016-08-15 MED ORDER — ASPIRIN EC 81 MG PO TBEC
81.0000 mg | DELAYED_RELEASE_TABLET | Freq: Every day | ORAL | Status: DC
  Administered 2016-08-15 – 2016-08-16 (×2): 81 mg via ORAL
  Filled 2016-08-15 (×2): qty 1

## 2016-08-15 MED ORDER — POTASSIUM CHLORIDE CRYS ER 20 MEQ PO TBCR
40.0000 meq | EXTENDED_RELEASE_TABLET | Freq: Three times a day (TID) | ORAL | Status: AC
  Administered 2016-08-15 (×3): 40 meq via ORAL
  Filled 2016-08-15 (×3): qty 2

## 2016-08-15 NOTE — Telephone Encounter (Signed)
Please refer to Dr. Lovell SheehanJenkins due to history of multiple episodes of diverticulitis, needs colectomy   Please have her return to see us in 6-8 weeks.

## 2016-08-15 NOTE — Telephone Encounter (Signed)
Referral has been made.

## 2016-08-15 NOTE — Progress Notes (Signed)
[facility] PROGRESS NOTE    Progress Note  Lisa [name]  ZOX:[id number] DOB: 1960-04-17 DOA: 08/10/2016 PCP: Elenora Gamma, MD     Brief Narrative:   Lisa [name] is an 56 y.o. female past medical history significant for ischemic heart disease, recurring diverticulitis, essential hypertension presents with abdominal pain that began 4 days prior to admission.  Assessment/Plan:   Diverticulitis large intestine: CT scan of the abdomen felt was on 08/10/2016 showed Extensive changes of diverticulitis in the distal sigmoid colon with extensive wall thickening and surrounding mesenteric thickening. This mesenteric thickening extends into the upper pelvis in the midline. He is now on a soft diet, she relates that the antibiotics are causing her to be nauseated, we'll try her on oral Flagyl and Cipro with a regular diet on the hospital. GI was consulted recommended colectomy as an outpatient.  Hypokalemia: Continues to trend down despite IV repletion. Unsuccessful repletion of potassium IV, start her on oral potassium we'll give 2 g of magnesium IV. Essential hypertension, benign  Essential hypertension: Blood pressure continues to be high she isn't on her current home regimen.  GERD (gastroesophageal reflux disease) Continue PPI.  IBS (irritable bowel syndrome) No diarhea.   DVT prophylaxis: heparin Family Communication:none Disposition Plan/Barrier to D/C: home in am Code Status:     Code Status Orders        Start     Ordered   08/10/16 1610  Full code  Continuous     08/10/16 1609    Code Status History    Date Active Date Inactive Code Status Order ID Comments User Context   07/13/2015 11:59 PM 07/15/2015  5:04 PM Full Code [id number]  Houston Siren, MD Inpatient   08/02/2014  4:07 PM 08/04/2014  5:18 PM Full Code [id number]  Philip Aspen, Limmie Patricia, MD Inpatient        IV Access:    Peripheral IV   Procedures and diagnostic studies:   No results  found.   Medical Consultants:    None.  Anti-Infectives:   Cipro Flagyl.  Subjective:    Lisa [name] as his stomach gets upset and nauseated with a clear liquid diet and IV Flagyl. Has any abdominal pain.  Objective:    Vitals:   08/14/16 2134 08/15/16 0411 08/15/16 0613 08/15/16 0804  BP: 138/67  (!) 150/84 (!) 158/74  Pulse: 65  60 (!) 58  Resp: 16  16   Temp: 97.6 F (36.4 C)  97.5 F (36.4 C)   TempSrc: Oral  Oral   SpO2: 98%  98%   Weight:  87.4 kg (192 lb 10.9 oz)    Height:  5' (1.524 m)      Intake/Output Summary (Last 24 hours) at 08/15/16 1023 Last data filed at 08/15/16 0900  Gross per 24 hour  Intake             3615 ml  Output                0 ml  Net             3615 ml   Filed Weights   08/15/16 0411  Weight: 87.4 kg (192 lb 10.9 oz)    Exam: General exam: In no acute distress Respiratory system: Good air movement clear to auscultation. Cardiovascular system: Regular rate and rhythm with positive S1-S2 no murmurs rubs gallops Gastrointestinal system: Bowel sounds, soft nontender nondistended Central nervous system: Awake alert oriented 3 no focal deficits.  Extremities: No pedal edema. Skin: No rashes or ulcerations Psychiatry: Judgment and insight appeared normal.   Data Reviewed:    Labs: Basic Metabolic Panel:  Recent Labs Lab 08/10/16 1041 08/11/16 0414 08/14/16 1201  NA 136 136 133*  K 3.7 3.0* 2.8*  CL 102 103 97*  CO2 26 26 25   GLUCOSE 110* 128* 81  BUN 8 5* 6  CREATININE 0.54 0.60 0.71  CALCIUM 9.0 8.4* 9.1   GFR Estimated Creatinine Clearance: 76.3 mL/min (by C-G formula based on SCr of 0.71 mg/dL). Liver Function Tests:  Recent Labs Lab 08/10/16 1041  AST 20  ALT 27  ALKPHOS 68  BILITOT 0.6  PROT 7.9  ALBUMIN 3.9    Recent Labs Lab 08/10/16 1041  LIPASE 16   No results for input(s): AMMONIA in the last 168 hours. Coagulation profile No results for input(s): INR, PROTIME in the last 168  hours.  CBC:  Recent Labs Lab 08/10/16 1041 08/13/16 2324  WBC 11.5* 4.5  NEUTROABS 8.9*  --   HGB 13.0 12.1  HCT 37.7 34.1*  MCV 85.9 82.8  PLT 273 295   Cardiac Enzymes: No results for input(s): CKTOTAL, CKMB, CKMBINDEX, TROPONINI in the last 168 hours. BNP (last 3 results) No results for input(s): PROBNP in the last 8760 hours. CBG: No results for input(s): GLUCAP in the last 168 hours. D-Dimer: No results for input(s): DDIMER in the last 72 hours. Hgb A1c: No results for input(s): HGBA1C in the last 72 hours. Lipid Profile: No results for input(s): CHOL, HDL, LDLCALC, TRIG, CHOLHDL, LDLDIRECT in the last 72 hours. Thyroid function studies: No results for input(s): TSH, T4TOTAL, T3FREE, THYROIDAB in the last 72 hours.  Invalid input(s): FREET3 Anemia work up: No results for input(s): VITAMINB12, FOLATE, FERRITIN, TIBC, IRON, RETICCTPCT in the last 72 hours. Sepsis Labs:  Recent Labs Lab 08/10/16 1041 08/13/16 2324  WBC 11.5* 4.5   Microbiology No results found for this or any previous visit (from the past 240 hour(s)).   Medications:   . carvedilol  6.25 mg Oral BID WC  . heparin  5,000 Units Subcutaneous Q8H  . hydrochlorothiazide  25 mg Oral Daily  . ondansetron (ZOFRAN) IV  4 mg Intravenous TID WC & HS  . pantoprazole  40 mg Oral QAC breakfast   Continuous Infusions: . sodium chloride 75 mL/hr at 08/14/16 2138  . ciprofloxacin Stopped (08/15/16 0442)  . metronidazole Stopped (08/15/16 0408)    Time spent: 25 min   LOS: 5 days   Marinda ElkFELIZ ORTIZ, Brayli Klingbeil  [facility] Pager [phone]  *Please refer to [email or web address], password [email or web address] to get updated schedule on who will round on this patient, as hospitalists switch teams weekly. If 7PM-7AM, please contact night-coverage at [email or web address], password [email or web address] for any overnight needs.  08/15/2016, 10:23 AM

## 2016-08-15 NOTE — Care Management Note (Signed)
Case Management Note  Patient Details  Name: Lisa AntiguaFrances M Gainer MRN: [id number] Date of Birth: 22-Aug-1960  If discussed at Long Length of Stay Meetings, dates discussed:  08/15/2016   Malcolm Metrohildress, Draper Gallon Demske, RN 08/15/2016, 10:40 AM

## 2016-08-15 NOTE — Progress Notes (Signed)
    Subjective: No further N/V. Abdominal pain improved. Wants to go home. Tolerating diet.   Objective: Vital signs in last 24 hours: Temp:  [97.5 F (36.4 C)-98.4 F (36.9 C)] 97.5 F (36.4 C) (06/05 16100613) Pulse Rate:  [60-65] 60 (06/05 0613) Resp:  [16] 16 (06/05 0613) BP: (138-168)/(67-84) 150/84 (06/05 0613) SpO2:  [93 %-98 %] 98 % (06/05 0613) Weight:  [192 lb 10.9 oz (87.4 kg)] 192 lb 10.9 oz (87.4 kg) (06/05 0411) Last BM Date: 08/09/16 General:   Alert and oriented, pleasant Head:  Normocephalic and atraumatic. Eyes:  No icterus, sclera clear. Conjuctiva pink.  Abdomen:  Bowel sounds present, soft, mild TTP lower abdomen and LLQ, improved from exam yesterday Extremities:  Without edema. Neurologic:  Alert and  oriented x4 Psych:  Alert and cooperative. Normal mood and affect.  Intake/Output from previous day: 06/04 0701 - 06/05 0700 In: 3615 [P.O.:1080; I.V.:1835; IV Piggyback:700] Out: -  Intake/Output this shift: No intake/output data recorded.  Lab Results:  Recent Labs  08/13/16 2324  WBC 4.5  HGB 12.1  HCT 34.1*  PLT 295   BMET  Recent Labs  08/14/16 1201  NA 133*  K 2.8*  CL 97*  CO2 25  GLUCOSE 81  BUN 6  CREATININE 0.71  CALCIUM 9.1    Assessment: 56 year old female admitted with sigmoid diverticulitis and history of multiple episodes since 2016. Clinically improved, tolerating diet, and appropriate for discharge home once hypokalemia addressed. Will need outpatient surgery referral, which we will arrange.      Plan: Continue soft diet Continue antibiotics Outpatient surgery referral for partial colectomy Will follow-up as outpatient.  Gelene MinkAnna W. Kaegan Hettich, PhD, ANP-BC [facility]    LOS: 5 days    08/15/2016, 8:00 AM

## 2016-08-16 ENCOUNTER — Encounter: Payer: Self-pay | Admitting: Gastroenterology

## 2016-08-16 LAB — BASIC METABOLIC PANEL
Anion gap: 8 (ref 5–15)
BUN: 8 mg/dL (ref 6–20)
CO2: 24 mmol/L (ref 22–32)
Calcium: 9.1 mg/dL (ref 8.9–10.3)
Chloride: 103 mmol/L (ref 101–111)
Creatinine, Ser: 0.62 mg/dL (ref 0.44–1.00)
GFR calc Af Amer: >60 mL/min (ref >60)
GLUCOSE: 101 mg/dL — AB (ref 65–99)
POTASSIUM: 4.3 mmol/L (ref 3.5–5.1)
Sodium: 135 mmol/L (ref 135–145)

## 2016-08-16 MED ORDER — METRONIDAZOLE 500 MG PO TABS: 500.0000 mg | ORAL_TABLET | Freq: Four times a day (QID) | ORAL | 0 refills | Status: DC

## 2016-08-16 MED ORDER — CIPROFLOXACIN HCL 500 MG PO TABS: 500.0000 mg | ORAL_TABLET | Freq: Two times a day (BID) | ORAL | 0 refills | Status: DC

## 2016-08-16 MED ORDER — ONDANSETRON HCL 4 MG PO TABS: 4.0000 mg | ORAL_TABLET | Freq: Three times a day (TID) | ORAL | 0 refills | Status: DC | PRN

## 2016-08-16 NOTE — Progress Notes (Signed)
Discharge instructions given, verbalized understanding, out in stable condition via w/c with staff. 

## 2016-08-16 NOTE — Discharge Summary (Signed)
Physician Discharge Summary  Lisa [name] ZOX:[id number] DOB: January 07, 1961 DOA: 08/10/2016  PCP: Elenora Gamma, MD  Admit date: 08/10/2016 Discharge date: 08/16/2016  Recommendations for Outpatient Follow-up:  1. Resolution of acute diverticulitis 2. Outpatient surgical evaluation for consideration of elective partial colectomy   Follow-up Information    Elenora Gamma, MD Follow up.   Specialty:  Family Medicine Why:  as needed Contact information: [address] [place] [address] [phone]        West Bali, MD Follow up.   Specialty:  Gastroenterology Why:  office will contact you with appointment Contact information: [address] [place] [address] [phone]        Franky Macho, MD Follow up.   Specialty:  General Surgery Why:  office will contact you with appointment Contact information: [address] [facility] [address] [phone]           Filed Weights   08/15/16 0411  Weight: 87.4 kg (192 lb 10.9 oz)    Discharge Diagnoses:  1. Acute diverticulitis, recurrent  Discharge Condition: improved Disposition: home   Diet recommendation: low fiber  Filed Weights   08/15/16 0411  Weight: 87.4 kg (192 lb 10.9 oz)    History of present illness:  56 year old woman PMH recurrent diverticulitis, ischemic heart disease, presented with abdominal pain. Admitted for acute diverticulitis.  Hospital Course:  Patient treated with empiric ciprofloxacin and Flagyl with gradual clinical improvement, followed by gastroenterology. Ultimately symptoms resolved on antibiotics, patient tolerated diet and oral antibiotics. Hospitalization was uncomplicated. Given recurrent diverticulitis, GI will follow-up in the outpatient setting and also arrange follow-up with general surgery for consideration of elective colectomy.  Consultants:  Gastroenterology  Procedures:  None  Antimicrobials:  Ciprofloxacin 5/31 >>  6/13  Metronidazole 5/31 >> 6/13  Today's assessment: S: Feels much better. No abdominal pain. Eating well. O: Vitals: Temperature 98.5, afebrile. Respirations 18, pulse 66, blood pressure 156/75, SPO2 97% on room air   Appears calm, comfortable.  Cardiovascular regular rate and rhythm. No murmur, rub or gallop.  Respiratory clear to auscultation bilaterally. No wheezes, rales or rhonchi. Normal respiratory effort.  Abdomen soft  Discharge Instructions  Discharge Instructions    Activity as tolerated - No restrictions    Complete by:  As directed    Discharge instructions    Complete by:  As directed    Call your physician or seek immediate medical attention for increased pain, vomiting, fever or shortness of breath. Low fiber diet.     Allergies as of 08/16/2016      Reactions   Metoprolol Nausea And Vomiting   Dizziness Elevated BP   Morphine And Related Other (See Comments)   Disoriented, vomiting and nausea   Naproxen Other (See Comments)   Lightheadedness   Tramadol Nausea And Vomiting   Ibuprofen Rash   Penicillins Nausea Only, Rash   Has patient had a PCN reaction causing immediate rash, facial/tongue/throat swelling, SOB or lightheadedness with hypotension:no Has patient had a PCN reaction causing severe rash involving mucus membranes or skin necrosis: yes Has patient had a PCN reaction that required hospitalization: no Has patient had a PCN reaction occurring within the last 10 years: no If all of the above answers are "NO", then may proceed with Cephalosporin use.      Medication List    STOP taking these medications   azithromycin 250 MG tablet Commonly known as:  ZITHROMAX     TAKE these medications   albuterol  108 (90 Base) MCG/ACT inhaler Commonly known as:  PROVENTIL HFA;VENTOLIN HFA Inhale 2 puffs into the lungs every 6 (six) hours as needed for wheezing.   aspirin EC 81 MG tablet Take 81 mg by mouth daily.   B-12 PO Take 1 tablet by mouth  daily.   carvedilol 6.25 MG tablet Commonly known as:  COREG Take 1 tablet (6.25 mg total) by mouth 2 (two) times daily with a meal.   CHARCOAL ACTIVATED PO Take 2 tablets by mouth as needed.   cholecalciferol 1000 units tablet Commonly known as:  VITAMIN D Take 1,000 Units by mouth daily.   ciprofloxacin 500 MG tablet Commonly known as:  CIPRO Take 1 tablet (500 mg total) by mouth 2 (two) times daily.   dexlansoprazole 60 MG capsule Commonly known as:  DEXILANT Take 1 capsule (60 mg total) by mouth daily.   EQ MOTION SICKNESS PO Apply 1 application topically as needed (for nausea/vomiting). Reported on 08/25/2015   FIBER THERAPY oral powder Generic drug:  methylcellulose Take 1 packet by mouth 3 (three) times daily. 1 tsp daily in the morning   GARLIC PO Take 1 tablet by mouth daily.   GINGER PO Take 1 tablet by mouth daily as needed (nausea).   hydrochlorothiazide 25 MG tablet Commonly known as:  HYDRODIURIL Take 1 tablet (25 mg total) by mouth daily.   LINZESS 145 MCG Caps capsule Generic drug:  linaclotide Take 145 mcg by mouth daily before breakfast.   metroNIDAZOLE 500 MG tablet Commonly known as:  FLAGYL Take 1 tablet (500 mg total) by mouth 4 (four) times daily.   ondansetron 4 MG tablet Commonly known as:  ZOFRAN Take 1 tablet (4 mg total) by mouth every 8 (eight) hours as needed for nausea or vomiting.   TURMERIC PO Take 1 tablet by mouth daily.      Allergies  Allergen Reactions  . Metoprolol Nausea And Vomiting    Dizziness Elevated BP  . Morphine And Related Other (See Comments)    Disoriented, vomiting and nausea  . Naproxen Other (See Comments)    Lightheadedness  . Tramadol Nausea And Vomiting  . Ibuprofen Rash  . Penicillins Nausea Only and Rash    Has patient had a PCN reaction causing immediate rash, facial/tongue/throat swelling, SOB or lightheadedness with hypotension:no Has patient had a PCN reaction causing severe rash  involving mucus membranes or skin necrosis: yes Has patient had a PCN reaction that required hospitalization: no Has patient had a PCN reaction occurring within the last 10 years: no If all of the above answers are "NO", then may proceed with Cephalosporin use.     The results of significant diagnostics from this hospitalization (including imaging, microbiology, ancillary and laboratory) are listed below for reference.    Significant Diagnostic Studies: Ct Abdomen Pelvis W Contrast  Result Date: 08/10/2016 CLINICAL DATA:  Four day history abdominal pain EXAM: CT ABDOMEN AND PELVIS WITH CONTRAST TECHNIQUE: Multidetector CT imaging of the abdomen and pelvis was performed using the standard protocol following bolus administration of intravenous contrast. CONTRAST:  100mL ISOVUE-300 IOPAMIDOL (ISOVUE-300) INJECTION 61% COMPARISON:  Jul 13, 2015 FINDINGS: Lower chest: Lung bases are clear. Hepatobiliary: There is a stable focus of decreased attenuation posterior segment right lobe of the liver, a likely small cyst. No new liver lesions evident. Gallbladder wall is not appreciably thickened. There is no biliary duct dilatation. Pancreas: No pancreatic mass or inflammatory focus. Spleen: No splenic lesions are evident. Adrenals/Urinary Tract: Right adrenal appears  normal. There is a 1.1 x 1.0 cm probable adenoma in the left adrenal. Kidneys bilaterally show no evident mass or hydronephrosis on either side. There is no renal or ureteral calculus on either side. Urinary bladder is midline with wall thickness within normal limits. Stomach/Bowel: There is diffuse wall thickening in the mid the distal sigmoid colon with surrounding mesenteric thickening several irregular diverticulum consistent with diverticulitis in this region. There is a small amount of loculated fluid in this area. There is no well-defined abscess in this area. No perforation evident. There are multiple diverticula in the descending colon region.  There is slight mesenteric thickening in the left lower quadrant in this area, probably due to scarring from prior diverticulitis in this area. There is no appreciable bowel wall or mesenteric thickening elsewhere. No bowel obstruction. There is no demonstrable free air or portal venous air. Vascular/Lymphatic: There are foci of atherosclerotic calcification in the aorta and bifurcation regions. No aneurysm evident. Major mesenteric vessels appear patent. There is no adenopathy in the abdomen or pelvis. Reproductive: Uterus is anteverted.  No pelvic masses evident. Other: Appendix appears unremarkable. There is no frank abscess for ascites in the abdomen or pelvis. Note that there is a small amount of loculated fluid in the area of diverticulitis. There is thinning of the rectus muscle in the midline without frank hernia. Musculoskeletal: There are no blastic or lytic bone lesions. There is no intramuscular or abdominal wall lesion. IMPRESSION: Extensive changes of diverticulitis in the distal sigmoid colon with extensive wall thickening and surrounding mesenteric thickening. This mesenteric thickening extends into the upper pelvis in the midline. There is extensive left-sided colonic diverticulosis. In the distal descending colon region, there is probable scarring in the adjacent mesenteric prior diverticulitis. A less likely second smaller focus of diverticulitis in this area is possible but felt to be less likely. There is no perforation or frank abscess in the region of diverticulitis. There is bowel wall edema. In the earliest changes of phlegmon formation in this area cannot be entirely excluded by CT. No bowel obstruction. No abscess elsewhere in the abdomen pelvis. Appendix region appears normal. No renal or ureteral calculus.  No hydronephrosis. Mild aortic atherosclerosis. Electronically Signed   By: Bretta Bang III M.D.   On: 08/10/2016 11:54     Labs: Basic Metabolic Panel:  Recent Labs Lab  08/10/16 1041 08/11/16 0414 08/14/16 1201 08/16/16 0424  NA 136 136 133* 135  K 3.7 3.0* 2.8* 4.3  CL 102 103 97* 103  CO2 26 26 25 24   GLUCOSE 110* 128* 81 101*  BUN 8 5* 6 8  CREATININE 0.54 0.60 0.71 0.62  CALCIUM 9.0 8.4* 9.1 9.1   Liver Function Tests:  Recent Labs Lab 08/10/16 1041  AST 20  ALT 27  ALKPHOS 68  BILITOT 0.6  PROT 7.9  ALBUMIN 3.9    Recent Labs Lab 08/10/16 1041  LIPASE 16   CBC:  Recent Labs Lab 08/10/16 1041 08/13/16 2324  WBC 11.5* 4.5  NEUTROABS 8.9*  --   HGB 13.0 12.1  HCT 37.7 34.1*  MCV 85.9 82.8  PLT 273 295    Principal Problem:   Diverticulitis large intestine Active Problems:   Essential hypertension, benign   Chronic ischemic heart disease   GERD (gastroesophageal reflux disease)   IBS (irritable bowel syndrome)   Internal and external hemorrhoids without complication   Acute diverticulitis   Left lower quadrant pain   Nausea without vomiting   Time coordinating  discharge: 35 minutes  Signed:  Brendia Sacks, MD [facility] 08/16/2016, 2:38 PM

## 2016-08-16 NOTE — Care Management Note (Signed)
Case Management Note  Patient Details  Name: Lisa [name] MRN: [id number] Date of Birth: [birth year]  Expected Discharge Date:  08/16/16               Expected Discharge Plan:  Home/Self Care  In-House Referral:  Financial Counselor  Discharge planning Services  CM Consult, [facility]  Post Acute Care Choice:  NA Choice offered to:  NA  tatus of Service:  Completed, signed off  Additional Comments: Pt discharging home today with self care. MATCH voucher given. No other needs communicated.   Malcolm Metrohildress, Oluwadamilare Tobler Demske, RN 08/16/2016, 2:52 PM

## 2016-08-17 ENCOUNTER — Encounter: Payer: Self-pay | Admitting: Gastroenterology

## 2016-08-24 ENCOUNTER — Encounter: Payer: Self-pay | Admitting: Family Medicine

## 2016-08-24 ENCOUNTER — Ambulatory Visit (INDEPENDENT_AMBULATORY_CARE_PROVIDER_SITE_OTHER): Payer: Medicaid Other | Admitting: Family Medicine

## 2016-08-24 VITALS — BP 125/74 | HR 65 | Temp 97.7°F | Ht 61.0 in | Wt 192.6 lb

## 2016-08-24 DIAGNOSIS — K5792 Diverticulitis of intestine, part unspecified, without perforation or abscess without bleeding: Secondary | ICD-10-CM | POA: Diagnosis not present

## 2016-08-24 DIAGNOSIS — I10 Essential (primary) hypertension: Secondary | ICD-10-CM

## 2016-08-24 NOTE — Progress Notes (Signed)
   HPI  Patient presents today here for hospital follow-up.  Patient was discharged from the hospital 2 weeks ago, she was admitted for acute reticulitis. She was treated with Cipro and Flagyl and had good improvement. She states that she is currently having some intermittent nausea and constipation, however she is dealing well with this with linzess.   She states that her abdominal pain is much improved. It is almost completely resolved. She's tolerating food and fluids like usual. She doesn't tend to have surgery if she can have financial support.   PMH: Smoking status noted ROS: Per HPI  Objective: BP 125/74   Pulse 65   Temp 97.7 F (36.5 C) (Oral)   Ht 5\' 1"  (1.549 m)   Wt 192 lb 9.6 oz (87.4 kg)   LMP 12/26/2011   BMI 36.39 kg/m  Gen: NAD, alert, cooperative with exam HEENT: NCAT, EOMI, PERRL CV: RRR, good S1/S2, no murmur Resp: CTABL, no wheezes, non-labored Abd: Soft, tenderness to palpation of left lower quadrant which is very mild, no guarding, positive bowel sounds Ext: No edema, warm Neuro: Alert and oriented, No gross deficits  Assessment and plan:  # Diverticulitis Resolving, improved much since leaving the hospital. Discussed pros and cons of surgical resection, she will pursue this if she has good financial coverage for it. She is tolerating food and fluids like normal. Predischarge labs looked good, patient is self-pay today so I will not pursue any additional labs.  Hypertension Well-controlled on only carvedilol, continue   Murtis SinkSam Toluwani Ruder, MD [facility] 08/24/2016, 11:46 AM

## 2016-08-31 ENCOUNTER — Encounter: Payer: Self-pay | Admitting: Family Medicine

## 2016-08-31 ENCOUNTER — Encounter: Payer: Self-pay | Admitting: Gastroenterology

## 2016-09-01 ENCOUNTER — Encounter: Payer: Self-pay | Admitting: Family Medicine

## 2016-09-01 ENCOUNTER — Encounter: Payer: Self-pay | Admitting: General Surgery

## 2016-09-04 ENCOUNTER — Encounter: Payer: Self-pay | Admitting: Family Medicine

## 2016-09-05 ENCOUNTER — Telehealth: Payer: Self-pay | Admitting: Family Medicine

## 2016-09-05 ENCOUNTER — Ambulatory Visit: Payer: Self-pay | Admitting: General Surgery

## 2016-09-05 ENCOUNTER — Telehealth: Payer: Self-pay | Admitting: General Practice

## 2016-09-05 NOTE — Telephone Encounter (Signed)
I called the patient's pcp and spoke with the office managers and made them aware of the recent patient emails.  They will have her pcp review her chart.  Routing to Dr. Darrick PennaFields as a Lorain ChildesFYI

## 2016-09-06 ENCOUNTER — Encounter: Payer: Self-pay | Admitting: Family Medicine

## 2016-09-06 ENCOUNTER — Telehealth: Payer: Self-pay | Admitting: Family Medicine

## 2016-09-06 NOTE — Telephone Encounter (Signed)
Reached out to patient to see if she would like to come in and talk to Dr. Ermalinda MemosBradshaw about her concerns with our office.  Patient refused. She states she nor her husband will not be coming back to our practice.

## 2016-09-06 NOTE — Telephone Encounter (Signed)
Appreciate FYI.   It is unfortunate that she will not come in for help. After reviewing the emails in total I think she is most likely having delusions and would benefit form psychiatric help.   Murtis SinkSam Bradshaw, MD [facility] 09/06/2016, 4:39 PM

## 2016-09-07 ENCOUNTER — Telehealth: Payer: Self-pay | Admitting: Family Medicine

## 2016-09-07 NOTE — Telephone Encounter (Signed)
Called her and her husband, both went to VM.   I asked that they call back, we would like to schedule an appointment.   Murtis SinkSam Everlene Cunning, MD [facility] 09/07/2016, 5:56 PM

## 2016-09-07 NOTE — Telephone Encounter (Signed)
REVIEWED-NO ADDITIONAL RECOMMENDATIONS. 

## 2016-09-08 ENCOUNTER — Encounter: Payer: Self-pay | Admitting: Family Medicine

## 2016-09-25 ENCOUNTER — Encounter: Payer: Self-pay | Admitting: Cardiology

## 2016-09-25 ENCOUNTER — Encounter: Payer: Self-pay | Admitting: Gastroenterology

## 2016-09-26 ENCOUNTER — Ambulatory Visit: Payer: Self-pay | Admitting: General Surgery

## 2016-09-26 NOTE — Telephone Encounter (Signed)
I am not sure that I completely follow your discussion and question. When you were last in the office back in January, we discussed a follow-up stress test (Myoview) since at that time you were also considering potential gastrointestinal evaluation for surgery and your previous stress test had been in 2014. I see that ultimately you did not proceed with the follow-up stress test. At this point I would not anticipate any specific cardiac restriction to your being able to pursue office, clerical, or court case work presuming you are symptomatically stable as you were in January. If your situation has changed in terms of cardiac symptoms, then we would need to discuss things further at an office visit and decide if further testing is indicated.

## 2017-01-10 ENCOUNTER — Telehealth: Payer: Self-pay | Admitting: Gastroenterology

## 2017-01-10 NOTE — Telephone Encounter (Signed)
Noted  

## 2017-01-10 NOTE — Telephone Encounter (Signed)
REVIEWED. CANCEL ALL FUTURE APPTS.

## 2017-01-10 NOTE — Telephone Encounter (Signed)
Pt called requesting a release of records to be mailed to her so we could [facility]. I have mailed release to her with a return envelope to mail back to me.

## 2017-01-11 NOTE — Telephone Encounter (Signed)
Misty StanleyStacey please cancel all future appointments.

## 2017-01-11 NOTE — Telephone Encounter (Signed)
Cancelled all future recalls

## 2017-05-16 NOTE — Telephone Encounter (Signed)
error 

## 2018-07-08 NOTE — Progress Notes (Signed)
REVIEWED-NO ADDITIONAL RECOMMENDATIONS. 

## 2020-03-04 ENCOUNTER — Encounter: Payer: Self-pay | Admitting: Nurse Practitioner

## 2022-04-06 ENCOUNTER — Ambulatory Visit: Payer: Medicaid Other | Attending: Audiologist | Admitting: Audiologist

## 2022-04-06 DIAGNOSIS — H9313 Tinnitus, bilateral: Secondary | ICD-10-CM | POA: Diagnosis present

## 2022-04-06 NOTE — Procedures (Signed)
  Outpatient Audiology and [place], [place]  [address] [phone]  AUDIOLOGICAL  EVALUATION  NAME: Lisa [name]     DOB:   [birth year]      MRN: [id number]                                                                                     DATE: 04/06/2022     REFERENT: Bridget Hartshorn, NP STATUS: Outpatient DIAGNOSIS: Tinnitus   History: Lisa [name] was seen for an audiological evaluation. Lisa [name] was accompanied to the appointment by her husband. Lisa [name] is receiving a hearing evaluation due to concerns for a humming sound in each ear. Lisa [name] had an in depth description of the nature of her tinnitus. It is a humming like the lowest note on a standard flute that will also pulse and change pitch. The pulsing starts when her blood pressure rises. Lisa [name] has been focusing on the humming wondering where its coming from. 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 She has a musical brain and naturally pays lots of attention to her sound environment. Lisa [name] said the humming started two years ago, but has also been present since she was young, and go worse when she had a bad fall in the early 2000s.  Medical history negative for a condition which is a risk factor for hearing loss. No other relevant case history reported.   Evaluation:  Otoscopy showed a clear view of the tympanic membranes, bilaterally Tympanometry results were consistent with normal middle ear pressure, bilaterally   Audiometric testing was completed using conventional audiometry with insert transducer. Speech Recognition Thresholds were  20dB in the right ear and 20dB in the left ear. Word Recognition was performed 40 dB SL, scored 100% in the right ear and 100% in the left ear. Pure tone thresholds show normal sloping to a mild hearing loss at 6-8kHz only in both ears. Hearing essentially normal.    Results:  The test results were reviewed with Lisa [name] and her husband. 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 When she is stressed her blood pressure and the humming will get worse. She is fixating on the humming. Lisa [name] needs to "bless it and release it". She feels better knowing why the humming is present.   Recommendations: 1.   Use music and other sounds to mask tinnitus. Do NOT fixate on tinnitus.    35 minutes spent testing and counseling on results.   Alfonse Alpers  Audiologist, Au.D., CCC-A 04/06/2022  3:02 PM  Cc: Bridget Hartshorn, NP

## 2022-10-17 ENCOUNTER — Other Ambulatory Visit: Payer: Self-pay

## 2022-10-17 ENCOUNTER — Ambulatory Visit (HOSPITAL_COMMUNITY): Payer: Medicaid Other | Attending: Family Medicine

## 2022-10-17 DIAGNOSIS — R42 Dizziness and giddiness: Secondary | ICD-10-CM | POA: Insufficient documentation

## 2022-10-17 NOTE — Therapy (Signed)
OUTPATIENT PHYSICAL THERAPY VESTIBULAR EVALUATION     Patient Name: Lisa [name] MRN: [id number] DOB:Apr 26, 1960, 62 y.o., female Today's Date: 10/17/2022  END OF SESSION:  PT End of Session - 10/17/22 1053     Visit Number 1    Number of Visits 6    Date for PT Re-Evaluation 11/28/22    Authorization Type Medicaid Prepaid [facility]  please check auth    PT Start Time 1100    PT Stop Time 1140    PT Time Calculation (min) 40 min    Activity Tolerance Patient tolerated treatment well    Behavior During Therapy [facility] for tasks assessed/performed             Past Medical History:  Diagnosis Date   Allergy    Anemia    Arthritis    Asthma    Bursitis    Chronic back pain    Patient reports lower back disc problems   Degenerative lumbar disc    Diverticulitis    Enlarged uterus 03/18/2014   Essential hypertension, benign    GERD (gastroesophageal reflux disease)    High blood pressure    High cholesterol    Mixed hyperlipidemia    Mixed stress and urge urinary incontinence 03/18/2014   Myocardial infarction [facility])    Patient reports related to "steroids" in 1985 ([place])   PMB (postmenopausal bleeding) 03/18/2014   Seizures (HCC)    Stroke (HCC)    Tendinitis    Patient reports chronic problem involving her arms and hands   Past Surgical History:  Procedure Laterality Date   adnoids     CESAREAN SECTION     COLONOSCOPY N/A 06/04/2014   SLF: 1. moderate diverticulosis in teh sigmoid colon, descending colon, and transverse colon. 2. Two colon polyps removed 3. Large external and small internal hemorroids.    TONSILLECTOMY     Patient Active Problem List   Diagnosis Date Noted   Left lower quadrant pain    Nausea without vomiting    Acute diverticulitis 08/10/2016   Obesity 12/21/2015   Chest pain 10/19/2015   Diverticulitis 07/13/2015   GERD (gastroesophageal reflux disease) 06/30/2015   IBS (irritable bowel syndrome) 06/30/2015   Internal and external hemorrhoids  without complication 06/30/2015   Chronic ischemic heart disease 01/07/2015   Prediabetes 01/07/2015   Healthcare maintenance 01/07/2015   Diverticulitis large intestine 08/25/2014   Change in bowel habits    Chronic bilateral lower abdominal pain 05/13/2014   PMB (postmenopausal bleeding) 03/18/2014   Enlarged uterus 03/18/2014   Mixed stress and urge urinary incontinence 03/18/2014   Asthma, extrinsic 09/15/2013   Dyspnea 04/18/2013   Abnormal cardiovascular function study 01/21/2013   Precordial pain 12/10/2012   Essential hypertension, benign 12/10/2012   Mixed hyperlipidemia 12/10/2012    PCP: Corrington, Kip A, MD REFERRING PROVIDER: Corrington, Meredith Mody, MD  REFERRING DIAG: R42 (ICD-10-CM) - Dizziness and giddiness  THERAPY DIAG:  Dizziness and giddiness  ONSET DATE: Jan 2003  Rationale for Evaluation and Treatment: Rehabilitation  SUBJECTIVE:   SUBJECTIVE STATEMENT: Patient complains of dizziness, reports the sensation of the room spinning when she has an incident. Sometimes so severe that it impedes her mobility, sometimes she uses assistance of a walker; also has some low back issue from horseback riding accidents that she has to use walker for.  Has trouble reaching down to pick something off the floor; or if she has to do heavy work or walking.  She also experiences intermittent tinnitus and  headaches described as sharp pain across her forehead.MD gave her meclizine and this seems to be helping and referred to physical therapy .  Had MRA but no results yet; last incident with getting dizzy was about 2 weeks; sometimes it can happen just sitting or driving or walking; seems to have no rhyme or reason; varies in intensity and length.   Patient feels it is possibly coming from her neck; Jan 2003 had a fall in a stone shower and had seizures; double vision issues with neck and upper back; out of work 6 months after that. Rates her dizziness 3/10 right now; carry an emesis bag  in car; reports childhood history of car sickness/motion sickness; increased dizziness if she reads too much Pt accompanied by: self  PERTINENT HISTORY: DDD lumbar; seizures; fentyl poisoning 2018; stroke 1994 could not use right side afterwards for a while  PAIN:  Are you having pain? Yes: NPRS scale: 4/10 Pain location: Left shoulder Pain description: irritating Aggravating factors: reaching up  Relieving factors: heat  PRECAUTIONS: Fall  WEIGHT BEARING RESTRICTIONS: No  FALLS: Has patient fallen in last 6 months? No; several near falls but catches herself on furniture  PLOF: Independent  PATIENT GOALS: being able to do things I enjoy doing; do my work on [employer] without headaches or vertigo  OBJECTIVE:   DIAGNOSTIC FINDINGS: MRA but no results yet  COGNITION: Overall cognitive status: Within functional limits for tasks assessed   SENSATION: States some numbness in left hand   POSTURE:  rounded shoulders and forward head  Cervical ROM:  discomfort with all cervical movements "hurts my vertebrae 3 to 4 vertebra down"  Active AROM (deg) eval  Flexion 40*  Extension 30*  Right lateral flexion 20*  Left lateral flexion 15*  Right rotation 42*  Left rotation 42*  Shoulder flexion/scaption left 101  (Blank rows = not tested)  STRENGTH: 4/5 shoulder flexion * pain in my spine in my vertebrae  LOWER EXTREMITY MMT:   MMT Right eval Left eval  Hip flexion    Hip abduction    Hip adduction    Hip internal rotation    Hip external rotation    Knee flexion    Knee extension    Ankle dorsiflexion    Ankle plantarflexion    Ankle inversion    Ankle eversion    (Blank rows = not tested)  BED MOBILITY:  Not tested  TRANSFERS: Assistive device utilized: None  Sit to stand: Modified independence Stand to sit: Modified independence Chair to chair:  not tested Floor:  not tested  RAMP:  not tested  CURB:  not tested  GAIT: Gait pattern:   decreased Distance walked: 50 ft in clinic Assistive device utilized: None Level of assistance: Modified independence Comments: decreased overall gait speed  FUNCTIONAL TESTS:  5 times sit to stand: not tested  PATIENT SURVEYS:  DHI 48  VESTIBULAR ASSESSMENT:  GENERAL OBSERVATION: glasses (reading)   SYMPTOM BEHAVIOR:  Subjective history: see above  Non-Vestibular symptoms: neck pain, headaches, tinnitus, and nausea/vomiting  Type of dizziness: Imbalance (Disequilibrium), Spinning/Vertigo, and Lightheadedness/Faint  Frequency: varies  Duration: 5 min to 5 hours  Aggravating factors: Moving eyes and moving vehicle  Relieving factors: head stationary, medication, closing eyes, and rest  Progression of symptoms: better  OCULOMOTOR EXAM:  Ocular Alignment: normal  Ocular ROM: No Limitations but reports some dizziness with following pen tip  Spontaneous Nystagmus: absent  Gaze-Induced Nystagmus: absent  Smooth Pursuits: intact  Saccades: intact, extra  eye movements, and reports eye pain  Convergence/Divergence: 9 cm states she is having double vision  VESTIBULAR - OCULAR REFLEX:   Slow VOR: Comment: not tested  VOR Cancellation: Comment: not tested  Head-Impulse Test: not tested  Dynamic Visual Acuity:  not tested   POSITIONAL TESTING: Other: not tested at eval  MOTION SENSITIVITY:  Motion Sensitivity Quotient Intensity: 0 = none, 1 = Lightheaded, 2 = Mild, 3 = Moderate, 4 = Severe, 5 = Vomiting  Intensity  1. Sitting to supine   2. Supine to L side   3. Supine to R side   4. Supine to sitting   5. L Hallpike-Dix   6. Up from L    7. R Hallpike-Dix   8. Up from R    9. Sitting, head tipped to L knee 2  10. Head up from L knee 2  11. Sitting, head tipped to R knee 2  12. Head up from R knee 2  13. Sitting head turns x5 3  14.Sitting head nods x5 2  15. In stance, 180 turn to L    16. In stance, 180 turn to R     OTHOSTATICS: not done     VESTIBULAR  TREATMENT:                                                                                                   DATE: 10/17/22 physical therapy evaluation and HEP instruction  Canalith Repositioning:  Comment: did not test for BPPV today Gaze Adaptation:   Habituation:   Other:   PATIENT EDUCATION: Education details: Patient educated on exam findings, POC, scope of PT, HEP, and what to expect next visit. Person educated: Patient Education method: Explanation, Demonstration, and Handouts Education comprehension: verbalized understanding, returned demonstration, verbal cues required, and tactile cues required  HOME EXERCISE PROGRAM: Access Code: [email or web address] URL: [email or web address] Date: 10/17/2022 Prepared by: AP - Rehab  Exercises - Seated Cervical Rotation AROM  - 2 x daily - 7 x weekly - 1 sets - 10 reps - Seated Cervical Flexion AROM  - 2 x daily - 7 x weekly - 1 sets - 10 reps - Seated Cervical Extension AROM  - 2 x daily - 7 x weekly - 1 sets - 10 reps GOALS: Goals reviewed with patient? No  SHORT TERM GOALS: Target date: 11/07/2022  patient will be independent with initial HEP  Baseline: Goal status: INITIAL  2.  Patient will self report 30% improvement to improve tolerance for functional activity  Baseline:  Goal status: INITIAL  LONG TERM GOALS: Target date: 11/28/2022  Patient will be independent in self management strategies to improve quality of life and functional outcomes.  Baseline:  Goal status: INITIAL  2.  Patient will self report 50% improvement to improve tolerance for functional activity  Baseline:  Goal status: INITIAL  3.  Patient will improve/decrease her score on the DHI by 13 (45) points to demonstate decreased handicap due to dizziness Baseline: 48 Goal status: INITIAL  4.  Patient will improve cervical mobility by 30 degrees total to improve ability to  scan for safety with driving Baseline:  Goal status:  INITIAL  ASSESSMENT:  CLINICAL IMPRESSION: Patient is a 62 y.o. female who was seen today for physical therapy evaluation and treatment for R42 (ICD-10-CM) - Dizziness and giddiness.  Patient demonstrates increased vestibular symptoms with provocative testing which is negatively impacting patient ability to perform ADLs and functional mobility tasks. Question possible cervical involvement.  Patient will benefit from skilled physical therapy services to address these deficits to improve level of function with ADLs, functional mobility tasks, and reduce risk for falls.    OBJECTIVE IMPAIRMENTS: decreased activity tolerance, decreased knowledge of condition, decreased mobility, decreased ROM, dizziness, increased fascial restrictions, impaired perceived functional ability, and pain.   ACTIVITY LIMITATIONS: bending, transfers, bed mobility, reach over head, and caring for others  PARTICIPATION LIMITATIONS: driving, shopping, and community activity  REHAB POTENTIAL: Good  CLINICAL DECISION MAKING: Evolving/moderate complexity  EVALUATION COMPLEXITY: Moderate   PLAN:  PT FREQUENCY: 1x/week  PT DURATION: 6 weeks  PLANNED INTERVENTIONS: Therapeutic exercises, Therapeutic activity, Neuromuscular re-education, Balance training, Gait training, Patient/Family education, Joint manipulation, Joint mobilization, Stair training, Orthotic/Fit training, DME instructions, Aquatic Therapy, Dry Needling, Electrical stimulation, Spinal manipulation, Spinal mobilization, Cryotherapy, Moist heat, Compression bandaging, scar mobilization, Splintting, Taping, Traction, Ultrasound, Ionotophoresis 4mg /ml Dexamethasone, and Manual therapy   PLAN FOR NEXT SESSION: Review HEP and goals; test SLS; posterior canal testing; manual to address cervical stiffness  12:07 PM, 10/17/22 Alexus Michael Small Glennette Galster MPT [place] [facility] [place] [phone] Ph:[phone]

## 2022-11-02 ENCOUNTER — Encounter (HOSPITAL_COMMUNITY): Payer: Medicaid Other

## 2022-11-10 ENCOUNTER — Ambulatory Visit (HOSPITAL_COMMUNITY): Payer: Medicaid Other | Admitting: Physical Therapy

## 2022-11-10 DIAGNOSIS — R42 Dizziness and giddiness: Secondary | ICD-10-CM

## 2022-11-10 NOTE — Therapy (Signed)
OUTPATIENT PHYSICAL THERAPY VESTIBULAR Treatment     Patient Name: Lisa [name] MRN: [id number] DOB:08-26-1960, 62 y.o., female Today's Date: 11/10/2022  END OF SESSION:  PT End of Session - 11/10/22 1254     Visit Number 2    Number of Visits 6    Date for PT Re-Evaluation 11/28/22    Authorization Type Medicaid Prepaid [facility]  please check auth- per auya not auth needed until 12/12/22    PT Start Time 1300    PT Stop Time 1340    PT Time Calculation (min) 40 min    Activity Tolerance Patient tolerated treatment well    Behavior During Therapy [facility] for tasks assessed/performed             Past Medical History:  Diagnosis Date   Allergy    Anemia    Arthritis    Asthma    Bursitis    Chronic back pain    Patient reports lower back disc problems   Degenerative lumbar disc    Diverticulitis    Enlarged uterus 03/18/2014   Essential hypertension, benign    GERD (gastroesophageal reflux disease)    High blood pressure    High cholesterol    Mixed hyperlipidemia    Mixed stress and urge urinary incontinence 03/18/2014   Myocardial infarction [facility])    Patient reports related to "steroids" in 1985 ([place])   PMB (postmenopausal bleeding) 03/18/2014   Seizures (HCC)    Stroke (HCC)    Tendinitis    Patient reports chronic problem involving her arms and hands   Past Surgical History:  Procedure Laterality Date   adnoids     CESAREAN SECTION     COLONOSCOPY N/A 06/04/2014   SLF: 1. moderate diverticulosis in teh sigmoid colon, descending colon, and transverse colon. 2. Two colon polyps removed 3. Large external and small internal hemorroids.    TONSILLECTOMY     Patient Active Problem List   Diagnosis Date Noted   Left lower quadrant pain    Nausea without vomiting    Acute diverticulitis 08/10/2016   Obesity 12/21/2015   Chest pain 10/19/2015   Diverticulitis 07/13/2015   GERD (gastroesophageal reflux disease) 06/30/2015   IBS (irritable bowel syndrome) 06/30/2015    Internal and external hemorrhoids without complication 06/30/2015   Chronic ischemic heart disease 01/07/2015   Prediabetes 01/07/2015   Healthcare maintenance 01/07/2015   Diverticulitis large intestine 08/25/2014   Change in bowel habits    Chronic bilateral lower abdominal pain 05/13/2014   PMB (postmenopausal bleeding) 03/18/2014   Enlarged uterus 03/18/2014   Mixed stress and urge urinary incontinence 03/18/2014   Asthma, extrinsic 09/15/2013   Dyspnea 04/18/2013   Abnormal cardiovascular function study 01/21/2013   Precordial pain 12/10/2012   Essential hypertension, benign 12/10/2012   Mixed hyperlipidemia 12/10/2012    PCP: Corrington, Kip A, MD REFERRING PROVIDER: Corrington, Kip A, MD  REFERRING DIAG: R42 (ICD-10-CM) - Dizziness and giddiness  THERAPY DIAG:  Dizziness and giddiness  ONSET DATE: Jan 2003  Rationale for Evaluation and Treatment: Rehabilitation  SUBJECTIVE:  Pt states that she has been completing her exercises, however, side bend increases her pain.    SUBJECTIVE STATEMENT: Patient complains of dizziness, reports the sensation of the room spinning when she has an incident. Sometimes so severe that it impedes her mobility, sometimes she uses assistance of a walker; also has some low back issue from horseback riding accidents that she has to use walker for.  Has trouble reaching  down to pick something off the floor; or if she has to do heavy work or walking.  She also experiences intermittent tinnitus and headaches described as sharp pain across her forehead.MD gave her meclizine and this seems to be helping and referred to physical therapy .  Had MRA but no results yet; last incident with getting dizzy was about 2 weeks; sometimes it can happen just sitting or driving or walking; seems to have no rhyme or reason; varies in intensity and length.   Patient feels it is possibly coming from her neck; Jan 2003 had a fall in a stone shower and had seizures;  double vision issues with neck and upper back; out of work 6 months after that. Rates her dizziness 3/10 right now; carry an emesis bag in car; reports childhood history of car sickness/motion sickness; increased dizziness if she reads too much Pt accompanied by: self  PERTINENT HISTORY: DDD lumbar; seizures; fentyl poisoning 2018; stroke 1994 could not use right side afterwards for a while  PAIN:  Are you having pain? Yes: NPRS scale: 4/10 Pain location: Left shoulder Pain description: irritating Aggravating factors: reaching up  Relieving factors: heat  PRECAUTIONS: Fall  WEIGHT BEARING RESTRICTIONS: No  FALLS: Has patient fallen in last 6 months? No; several near falls but catches herself on furniture  PLOF: Independent  PATIENT GOALS: being able to do things I enjoy doing; do my work on [employer] without headaches or vertigo  OBJECTIVE:   DIAGNOSTIC FINDINGS: MRA but no results yet  COGNITION: Overall cognitive status: Within functional limits for tasks assessed   SENSATION: States some numbness in left hand   POSTURE:  rounded shoulders and forward head  Cervical ROM:  discomfort with all cervical movements "hurts my vertebrae 3 to 4 vertebra down"  Active AROM (deg) eval  Flexion 40*  Extension 30*  Right lateral flexion 20*  Left lateral flexion 15*  Right rotation 42*  Left rotation 42*  Shoulder flexion/scaption left 101  (Blank rows = not tested)  STRENGTH: 4/5 shoulder flexion * pain in my spine in my vertebrae    BED MOBILITY:  Not tested  TRANSFERS: Assistive device utilized: None  Sit to stand: Modified independence Stand to sit: Modified independence Chair to chair:  not tested Floor:  not tested  RAMP:  not tested  CURB:  not tested  GAIT: Gait pattern:  decreased Distance walked: 50 ft in clinic Assistive device utilized: None Level of assistance: Modified independence Comments: decreased overall gait speed  FUNCTIONAL TESTS:   5 times sit to stand: not tested  PATIENT SURVEYS:  DHI 48  VESTIBULAR ASSESSMENT:  GENERAL OBSERVATION: glasses (reading)   SYMPTOM BEHAVIOR:  Subjective history: see above  Non-Vestibular symptoms: neck pain, headaches, tinnitus, and nausea/vomiting  Type of dizziness: Imbalance (Disequilibrium), Spinning/Vertigo, and Lightheadedness/Faint  Frequency: varies  Duration: 5 min to 5 hours  Aggravating factors: Moving eyes and moving vehicle  Relieving factors: head stationary, medication, closing eyes, and rest  Progression of symptoms: better  OCULOMOTOR EXAM:  Ocular Alignment: normal  Ocular ROM: No Limitations but reports some dizziness with following pen tip  Spontaneous Nystagmus: absent  Gaze-Induced Nystagmus: absent  Smooth Pursuits: intact  Saccades: intact, extra eye movements, and reports eye pain  Convergence/Divergence: 9 cm states she is having double vision  VESTIBULAR - OCULAR REFLEX:   Slow VOR: Comment: not tested  VOR Cancellation: Comment: not tested  Head-Impulse Test: not tested  Dynamic Visual Acuity:  not tested  POSITIONAL TESTING: Other: not tested at eval  MOTION SENSITIVITY:  Motion Sensitivity Quotient Intensity: 0 = none, 1 = Lightheaded, 2 = Mild, 3 = Moderate, 4 = Severe, 5 = Vomiting  Intensity 11/10/22  1. Sitting to supine    2. Supine to L side  3  3. Supine to R side  3  4. Supine to sitting    5. L Hallpike-Dix  2  6. Up from L     7. R Hallpike-Dix  2  8. Up from R     9. Sitting, head tipped to L knee 2   10. Head up from L knee 2   11. Sitting, head tipped to R knee 2   12. Head up from R knee 2   13. Sitting head turns x5 3   14.Sitting head nods x5 2   15. In stance, 180 turn to L     16. In stance, 180 turn to R      OTHOSTATICS: not done     VESTIBULAR TREATMENT:                                                                                                   DATE:  11/10/22 Single leg stance: Rt 28", Lt  24"  Sitting: Cervical ROM x 10 each Scapular retraction x 10 Nose to RT knee x 5 Nose to lt knee x 5  Manual to cervical and upper thoracic with grade II mobs to decrease spasm and increase ROM  RT and Lt Dix-hallpike  both negative  Side lying test positive with upbeatting B, indicative of horizontal canal, however there was not enough time to complete the BBQ roll.   10/17/22 physical therapy evaluation and HEP instruction  Canalith Repositioning:  Comment: did not test for BPPV today Gaze Adaptation:   Habituation:   Other:   PATIENT EDUCATION: Education details: Patient educated on exam findings, POC, scope of PT, HEP, and what to expect next visit. Person educated: Patient Education method: Explanation, Demonstration, and Handouts Education comprehension: verbalized understanding, returned demonstration, verbal cues required, and tactile cues required  HOME EXERCISE PROGRAM:Access Code: [email or web address] URL: [email or web address] Date: 10/17/2022 Prepared by: AP - Rehab Date: 11/10/2022 Prepared by: Virgina Organ  Exercises - Seated Scapular Retraction  - 3 x daily - 7 x weekly - 1 sets - 10 reps - 5" hold - Seated Nose to Left Knee Vestibular Habituation  - 3 x daily - 7 x weekly - 1 sets - 10 reps - 1" hold - Seated Nose to Right Knee Vestibular Habituation  - 3 x daily - 7 x weekly - 1 sets - 10 reps - 1" hold Exercises - Seated Cervical Rotation AROM  - 2 x daily - 7 x weekly - 1 sets - 10 reps - Seated Cervical Flexion AROM  - 2 x daily - 7 x weekly - 1 sets - 10 reps - Seated Cervical Extension AROM  - 2 x daily - 7 x weekly - 1 sets - 10 reps GOALS: Goals reviewed with patient? No  SHORT TERM GOALS:  Target date: 11/07/2022  patient will be independent with initial HEP  Baseline: Goal status: IN PROGRESS  2.  Patient will self report 30% improvement to improve tolerance for functional activity  Baseline:  Goal status: IN PROGRESS  LONG TERM  GOALS: Target date: 11/28/2022  Patient will be independent in self management strategies to improve quality of life and functional outcomes.  Baseline:  Goal status: IN PROGRESS  2.  Patient will self report 50% improvement to improve tolerance for functional activity  Baseline:  Goal status: IN PROGRESS  3.  Patient will improve/decrease her score on the DHI by 13 (45) points to demonstate decreased handicap due to dizziness Baseline: 48 Goal status: IN PROGRESS  4.  Patient will improve cervical mobility by 30 degrees total to improve ability to scan for safety with driving Baseline:  Goal status: IN PROGRESS  ASSESSMENT:  CLINICAL IMPRESSION:Reviewed evaluation and goals with pt.  Tested for Dix-Hallpike which was negative, however roll test is positive on both the RT and left. indicative of horizontal canal.  Added nose to knee and scapular retraction to HEP.  PT is still having dizziness and will benefit from skilled PT services.    Patient is a 62 y.o. female who was seen today for physical therapy evaluation and treatment for R42 (ICD-10-CM) - Dizziness and giddiness.  Patient demonstrates increased vestibular symptoms with provocative testing which is negatively impacting patient ability to perform ADLs and functional mobility tasks. Question possible cervical involvement.  Patient will benefit from skilled physical therapy services to address these deficits to improve level of function with ADLs, functional mobility tasks, and reduce risk for falls.    OBJECTIVE IMPAIRMENTS: decreased activity tolerance, decreased knowledge of condition, decreased mobility, decreased ROM, dizziness, increased fascial restrictions, impaired perceived functional ability, and pain.   ACTIVITY LIMITATIONS: bending, transfers, bed mobility, reach over head, and caring for others  PARTICIPATION LIMITATIONS: driving, shopping, and community activity  REHAB POTENTIAL: Good  CLINICAL DECISION  MAKING: Evolving/moderate complexity  EVALUATION COMPLEXITY: Moderate   PLAN:  PT FREQUENCY: 1x/week  PT DURATION: 6 weeks  PLANNED INTERVENTIONS: Therapeutic exercises, Therapeutic activity, Neuromuscular re-education, Balance training, Gait training, Patient/Family education, Joint manipulation, Joint mobilization, Stair training, Orthotic/Fit training, DME instructions, Aquatic Therapy, Dry Needling, Electrical stimulation, Spinal manipulation, Spinal mobilization, Cryotherapy, Moist heat, Compression bandaging, scar mobilization, Splintting, Taping, Traction, Ultrasound, Ionotophoresis 4mg /ml Dexamethasone, and Manual therapy   PLAN FOR NEXT SESSION: roll manuever  2:45 PM, 11/10/22 Virgina Organ, PT CLT [phone]

## 2022-11-14 ENCOUNTER — Ambulatory Visit (HOSPITAL_COMMUNITY): Payer: Medicaid Other | Attending: Family Medicine | Admitting: Physical Therapy

## 2022-11-14 DIAGNOSIS — R42 Dizziness and giddiness: Secondary | ICD-10-CM | POA: Insufficient documentation

## 2022-11-14 NOTE — Therapy (Signed)
OUTPATIENT PHYSICAL THERAPY VESTIBULAR Treatment     Patient Name: Lisa [name] MRN: [id number] DOB:11/27/1960, 62 y.o., female Today's Date: 11/14/2022  END OF SESSION:  PT End of Session - 11/14/22 1548     Visit Number 3    Number of Visits 6    Date for PT Re-Evaluation 11/28/22    Authorization Type Medicaid [employer]aid [facility]  please check auth- per auya not auth needed until 12/12/22    PT Start Time 1505    PT Stop Time 1540    PT Time Calculation (min) 35 min    Activity Tolerance Patient tolerated treatment well    Behavior During Therapy [facility] for tasks assessed/performed              Past Medical History:  Diagnosis Date   Allergy    Anemia    Arthritis    Asthma    Bursitis    Chronic back pain    Patient reports lower back disc problems   Degenerative lumbar disc    Diverticulitis    Enlarged uterus 03/18/2014   Essential hypertension, benign    GERD (gastroesophageal reflux disease)    High blood pressure    High cholesterol    Mixed hyperlipidemia    Mixed stress and urge urinary incontinence 03/18/2014   Myocardial infarction [facility])    Patient reports related to "steroids" in 1985 ([place])   PMB (postmenopausal bleeding) 03/18/2014   Seizures (HCC)    Stroke (HCC)    Tendinitis    Patient reports chronic problem involving her arms and hands   Past Surgical History:  Procedure Laterality Date   adnoids     CESAREAN SECTION     COLONOSCOPY N/A 06/04/2014   SLF: 1. moderate diverticulosis in teh sigmoid colon, descending colon, and transverse colon. 2. Two colon polyps removed 3. Large external and small internal hemorroids.    TONSILLECTOMY     Patient Active Problem List   Diagnosis Date Noted   Left lower quadrant pain    Nausea without vomiting    Acute diverticulitis 08/10/2016   Obesity 12/21/2015   Chest pain 10/19/2015   Diverticulitis 07/13/2015   GERD (gastroesophageal reflux disease) 06/30/2015   IBS (irritable bowel syndrome) 06/30/2015    Internal and external hemorrhoids without complication 06/30/2015   Chronic ischemic heart disease 01/07/2015   Prediabetes 01/07/2015   Healthcare maintenance 01/07/2015   Diverticulitis large intestine 08/25/2014   Change in bowel habits    Chronic bilateral lower abdominal pain 05/13/2014   PMB (postmenopausal bleeding) 03/18/2014   Enlarged uterus 03/18/2014   Mixed stress and urge urinary incontinence 03/18/2014   Asthma, extrinsic 09/15/2013   Dyspnea 04/18/2013   Abnormal cardiovascular function study 01/21/2013   Precordial pain 12/10/2012   Essential hypertension, benign 12/10/2012   Mixed hyperlipidemia 12/10/2012    PCP: Corrington, Kip A, MD REFERRING PROVIDER: Corrington, Meredith Mody, MD  REFERRING DIAG: R42 (ICD-10-CM) - Dizziness and giddiness  THERAPY DIAG:  Dizziness and giddiness  ONSET DATE: Jan 2003  Rationale for Evaluation and Treatment: Rehabilitation  SUBJECTIVE:   PT states that she has been doing her exercises.  She feels she is improving and that the vertigo is not as bad when it hits.  She has an appointment with the eye MD>      Evaluation SUBJECTIVE STATEMENT: Patient complains of dizziness, reports the sensation of the room spinning when she has an incident. Sometimes so severe that it impedes her mobility, sometimes she uses  assistance of a walker; also has some low back issue from horseback riding accidents that she has to use walker for.  Has trouble reaching down to pick something off the floor; or if she has to do heavy work or walking.  She also experiences intermittent tinnitus and headaches described as sharp pain across her forehead.MD gave her meclizine and this seems to be helping and referred to physical therapy .  Had MRA but no results yet; last incident with getting dizzy was about 2 weeks; sometimes it can happen just sitting or driving or walking; seems to have no rhyme or reason; varies in intensity and length.   Patient feels it is  possibly coming from her neck; Jan 2003 had a fall in a stone shower and had seizures; double vision issues with neck and upper back; out of work 6 months after that. Rates her dizziness 3/10 right now; carry an emesis bag in car; reports childhood history of car sickness/motion sickness; increased dizziness if she reads too much Pt accompanied by: self  PERTINENT HISTORY: DDD lumbar; seizures; fentyl poisoning 2018; stroke 1994 could not use right side afterwards for a while  PAIN:  Are you having pain? Yes: NPRS scale: 4/10 Pain location: Left shoulder Pain description: irritating Aggravating factors: reaching up  Relieving factors: heat  PRECAUTIONS: Fall  WEIGHT BEARING RESTRICTIONS: No  FALLS: Has patient fallen in last 6 months? No; several near falls but catches herself on furniture  PLOF: Independent  PATIENT GOALS: being able to do things I enjoy doing; do my work on [employer] without headaches or vertigo  OBJECTIVE:   DIAGNOSTIC FINDINGS: MRA but no results yet  COGNITION: Overall cognitive status: Within functional limits for tasks assessed   SENSATION: States some numbness in left hand   POSTURE:  rounded shoulders and forward head  Cervical ROM:  discomfort with all cervical movements "hurts my vertebrae 3 to 4 vertebra down"  Active AROM (deg) eval  Flexion 40*  Extension 30*  Right lateral flexion 20*  Left lateral flexion 15*  Right rotation 42*  Left rotation 42*  Shoulder flexion/scaption left 101  (Blank rows = not tested)  STRENGTH: 4/5 shoulder flexion * pain in my spine in my vertebrae    BED MOBILITY:  Not tested  TRANSFERS: Assistive device utilized: None  Sit to stand: Modified independence Stand to sit: Modified independence Chair to chair:  not tested Floor:  not tested  RAMP:  not tested  CURB:  not tested  GAIT: Gait pattern:  decreased Distance walked: 50 ft in clinic Assistive device utilized: None Level of  assistance: Modified independence Comments: decreased overall gait speed  FUNCTIONAL TESTS:  5 times sit to stand: not tested  PATIENT SURVEYS:  DHI 48  VESTIBULAR ASSESSMENT:  GENERAL OBSERVATION: glasses (reading)   SYMPTOM BEHAVIOR:  Subjective history: see above  Non-Vestibular symptoms: neck pain, headaches, tinnitus, and nausea/vomiting  Type of dizziness: Imbalance (Disequilibrium), Spinning/Vertigo, and Lightheadedness/Faint  Frequency: varies  Duration: 5 min to 5 hours  Aggravating factors: Moving eyes and moving vehicle  Relieving factors: head stationary, medication, closing eyes, and rest  Progression of symptoms: better  OCULOMOTOR EXAM:  Ocular Alignment: normal  Ocular ROM: No Limitations but reports some dizziness with following pen tip  Spontaneous Nystagmus: absent  Gaze-Induced Nystagmus: absent  Smooth Pursuits: intact  Saccades: intact, extra eye movements, and reports eye pain  Convergence/Divergence: 9 cm states she is having double vision  VESTIBULAR - OCULAR REFLEX:  Slow VOR: Comment: not tested  VOR Cancellation: Comment: not tested  Head-Impulse Test: not tested  Dynamic Visual Acuity:  not tested   POSITIONAL TESTING: Other: not tested at eval  MOTION SENSITIVITY:  Motion Sensitivity Quotient Intensity: 0 = none, 1 = Lightheaded, 2 = Mild, 3 = Moderate, 4 = Severe, 5 = Vomiting  Intensity 11/10/22 11/14/22  1. Sitting to supine     2. Supine to L side  3 3  3. Supine to R side  3 3  4. Supine to sitting     5. L Hallpike-Dix  2   6. Up from L      7. R Hallpike-Dix  2   8. Up from R      9. Sitting, head tipped to L knee 2    10. Head up from L knee 2    11. Sitting, head tipped to R knee 2    12. Head up from R knee 2    13. Sitting head turns x5 3    14.Sitting head nods x5 2    15. In stance, 180 turn to L      16. In stance, 180 turn to R       OTHOSTATICS: not done     VESTIBULAR TREATMENT:                                                                                                    DATE:  11/14/22 Horizontal canal tested (-) each side. Cervical rotation x 10 Cervical side bend x 10  Supine to Right Sidelying Vestibular Habituation   10 reps - 5" hold - Supine to Left Sidelying Vestibular Habituation   10 reps - 5" hold - Brandt-Daroff Vestibular Exercise  - 5 reps x 2  Manual to cervical and upper thoracic with grade II mobs to decrease spasm and increase ROM Tandem stance with head turns Rt x 5; Lt x 5  11/10/22 Single leg stance: Rt 28", Lt 24"  Sitting: Cervical ROM x 10 each Scapular retraction x 10 Nose to RT knee x 5 Nose to lt knee x 5  Manual to cervical and upper thoracic with grade II mobs to decrease spasm and increase ROM  RT and Lt Dix-hallpike  both negative  Side lying test positive with upbeatting B, indicative of horizontal canal, however there was not enough time to complete the BBQ roll.   10/17/22 physical therapy evaluation and HEP instruction  Canalith Repositioning:  Comment: did not test for BPPV today Gaze Adaptation:   Habituation:   Other:   PATIENT EDUCATION: Education details: Patient educated on exam findings, POC, scope of PT, HEP, and what to expect next visit. Person educated: Patient Education method: Explanation, Demonstration, and Handouts Education comprehension: verbalized understanding, returned demonstration, verbal cues required, and tactile cues required  HOME EXERCISE PROGRAM:Access Code: [email or web address] URL: [email or web address] 11/14/2022 - Supine to Right Sidelying Vestibular Habituation  - 4 x daily - 7 x weekly - 1 sets - 10 reps - 5" hold - Supine to Left Sidelying Vestibular Habituation  -  4 x daily - 7 x weekly - 1 sets - 10 reps - 5" hold - Brandt-Daroff Vestibular Exercise  - 4 x daily - 7 x weekly - 1 sets - 3-5 reps - 2 hold Date: 11/10/2022 Prepared by: Virgina Organ  Exercises - Seated Scapular Retraction  - 3 x daily  - 7 x weekly - 1 sets - 10 reps - 5" hold - Seated Nose to Left Knee Vestibular Habituation  - 3 x daily - 7 x weekly - 1 sets - 10 reps - 1" hold - Seated Nose to Right Knee Vestibular Habituation  - 3 x daily - 7 x weekly - 1 sets - 10 reps - 1" hold Exercises- evaluation 10/17/22 - Seated Cervical Rotation AROM  - 2 x daily - 7 x weekly - 1 sets - 10 reps - Seated Cervical Flexion AROM  - 2 x daily - 7 x weekly - 1 sets - 10 reps - Seated Cervical Extension AROM  - 2 x daily - 7 x weekly - 1 sets - 10 reps GOALS: Goals reviewed with patient? No  SHORT TERM GOALS: Target date: 11/07/2022  patient will be independent with initial HEP  Baseline: Goal status: IN PROGRESS  2.  Patient will self report 30% improvement to improve tolerance for functional activity  Baseline:  Goal status: IN PROGRESS  LONG TERM GOALS: Target date: 11/28/2022  Patient will be independent in self management strategies to improve quality of life and functional outcomes.  Baseline:  Goal status: IN PROGRESS  2.  Patient will self report 50% improvement to improve tolerance for functional activity  Baseline:  Goal status: IN PROGRESS  3.  Patient will improve/decrease her score on the DHI by 13 (45) points to demonstate decreased handicap due to dizziness Baseline: 48 Goal status: IN PROGRESS  4.  Patient will improve cervical mobility by 30 degrees total to improve ability to scan for safety with driving Baseline:  Goal status: IN PROGRESS  ASSESSMENT:  CLINICAL IMPRESSION:Overall pt feels sx are improving.  Pt horizontal canal tested again this session and was (-), therefore therapist continued with habituation exercises including Brandt-daroff and supine to sidelying.  Therapist advanced exercises done in the clinic by adding tandem stance .  PT is still having dizziness and will benefit from skilled PT services.    Patient is a 62 y.o. female who was seen today for physical therapy evaluation  and treatment for R42 (ICD-10-CM) - Dizziness and giddiness.  Patient demonstrates increased vestibular symptoms with provocative testing which is negatively impacting patient ability to perform ADLs and functional mobility tasks. Question possible cervical involvement.  Patient will benefit from skilled physical therapy services to address these deficits to improve level of function with ADLs, functional mobility tasks, and reduce risk for falls.    OBJECTIVE IMPAIRMENTS: decreased activity tolerance, decreased knowledge of condition, decreased mobility, decreased ROM, dizziness, increased fascial restrictions, impaired perceived functional ability, and pain.   ACTIVITY LIMITATIONS: bending, transfers, bed mobility, reach over head, and caring for others  PARTICIPATION LIMITATIONS: driving, shopping, and community activity  REHAB POTENTIAL: Good  CLINICAL DECISION MAKING: Evolving/moderate complexity  EVALUATION COMPLEXITY: Moderate   PLAN:  PT FREQUENCY: 1x/week  PT DURATION: 6 weeks  PLANNED INTERVENTIONS: Therapeutic exercises, Therapeutic activity, Neuromuscular re-education, Balance training, Gait training, Patient/Family education, Joint manipulation, Joint mobilization, Stair training, Orthotic/Fit training, DME instructions, Aquatic Therapy, Dry Needling, Electrical stimulation, Spinal manipulation, Spinal mobilization, Cryotherapy, Moist heat, Compression bandaging, scar mobilization, Splintting, Taping,  Traction, Ultrasound, Ionotophoresis 4mg /ml Dexamethasone, and Manual therapy   PLAN FOR NEXT SESSION: give balance exercise for HEP, continue with habituation as needed.   3:53 PM, 11/14/22 Virgina Organ, PT CLT [phone]

## 2022-11-21 ENCOUNTER — Ambulatory Visit (HOSPITAL_COMMUNITY): Payer: Medicaid Other

## 2022-11-21 DIAGNOSIS — R42 Dizziness and giddiness: Secondary | ICD-10-CM

## 2022-11-21 NOTE — Therapy (Signed)
OUTPATIENT PHYSICAL THERAPY VESTIBULAR Treatment     Patient Name: Lisa [name] MRN: [id number] DOB:07/02/1960, 62 y.o., female Today's Date: 11/21/2022  END OF SESSION:  PT End of Session - 11/21/22 1342     Visit Number 4    Number of Visits 6    Date for PT Re-Evaluation 11/28/22    Authorization Type Medicaid [employer] [facility]  please check auth- per auya not auth needed until 12/12/22    PT Start Time 1342    PT Stop Time 1425    PT Time Calculation (min) 43 min    Activity Tolerance Patient tolerated treatment well    Behavior During Therapy [facility] for tasks assessed/performed              Past Medical History:  Diagnosis Date   Allergy    Anemia    Arthritis    Asthma    Bursitis    Chronic back pain    Patient reports lower back disc problems   Degenerative lumbar disc    Diverticulitis    Enlarged uterus 03/18/2014   Essential hypertension, benign    GERD (gastroesophageal reflux disease)    High blood pressure    High cholesterol    Mixed hyperlipidemia    Mixed stress and urge urinary incontinence 03/18/2014   Myocardial infarction [facility])    Patient reports related to "steroids" in 1985 ([place])   PMB (postmenopausal bleeding) 03/18/2014   Seizures (HCC)    Stroke (HCC)    Tendinitis    Patient reports chronic problem involving her arms and hands   Past Surgical History:  Procedure Laterality Date   adnoids     CESAREAN SECTION     COLONOSCOPY N/A 06/04/2014   SLF: 1. moderate diverticulosis in teh sigmoid colon, descending colon, and transverse colon. 2. Two colon polyps removed 3. Large external and small internal hemorroids.    TONSILLECTOMY     Patient Active Problem List   Diagnosis Date Noted   Left lower quadrant pain    Nausea without vomiting    Acute diverticulitis 08/10/2016   Obesity 12/21/2015   Chest pain 10/19/2015   Diverticulitis 07/13/2015   GERD (gastroesophageal reflux disease) 06/30/2015   IBS (irritable bowel syndrome) 06/30/2015    Internal and external hemorrhoids without complication 06/30/2015   Chronic ischemic heart disease 01/07/2015   Prediabetes 01/07/2015   Healthcare maintenance 01/07/2015   Diverticulitis large intestine 08/25/2014   Change in bowel habits    Chronic bilateral lower abdominal pain 05/13/2014   PMB (postmenopausal bleeding) 03/18/2014   Enlarged uterus 03/18/2014   Mixed stress and urge urinary incontinence 03/18/2014   Asthma, extrinsic 09/15/2013   Dyspnea 04/18/2013   Abnormal cardiovascular function study 01/21/2013   Precordial pain 12/10/2012   Essential hypertension, benign 12/10/2012   Mixed hyperlipidemia 12/10/2012    PCP: Corrington, Kip A, MD REFERRING PROVIDER: Corrington, Kip A, MD  REFERRING DIAG: R42 (ICD-10-CM) - Dizziness and giddiness  THERAPY DIAG:  Dizziness and giddiness  ONSET DATE: Jan 2003  Rationale for Evaluation and Treatment: Rehabilitation  SUBJECTIVE:   Dizziness is still there but getting better; can turn her head to her left further than she has since accident in 2003; wearing a thumb splint today on her left hand; driving is easier    Evaluation SUBJECTIVE STATEMENT: Patient complains of dizziness, reports the sensation of the room spinning when she has an incident. Sometimes so severe that it impedes her mobility, sometimes she uses assistance of a  walker; also has some low back issue from horseback riding accidents that she has to use walker for.  Has trouble reaching down to pick something off the floor; or if she has to do heavy work or walking.  She also experiences intermittent tinnitus and headaches described as sharp pain across her forehead.MD gave her meclizine and this seems to be helping and referred to physical therapy .  Had MRA but no results yet; last incident with getting dizzy was about 2 weeks; sometimes it can happen just sitting or driving or walking; seems to have no rhyme or reason; varies in intensity and length.    Patient feels it is possibly coming from her neck; Jan 2003 had a fall in a stone shower and had seizures; double vision issues with neck and upper back; out of work 6 months after that. Rates her dizziness 3/10 right now; carry an emesis bag in car; reports childhood history of car sickness/motion sickness; increased dizziness if she reads too much Pt accompanied by: self  PERTINENT HISTORY: DDD lumbar; seizures; fentyl poisoning 2018; stroke 1994 could not use right side afterwards for a while  PAIN:  Are you having pain? Yes: NPRS scale: 4/10 Pain location: Left shoulder Pain description: irritating Aggravating factors: reaching up  Relieving factors: heat  PRECAUTIONS: Fall  WEIGHT BEARING RESTRICTIONS: No  FALLS: Has patient fallen in last 6 months? No; several near falls but catches herself on furniture  PLOF: Independent  PATIENT GOALS: being able to do things I enjoy doing; do my work on [employer] without headaches or vertigo  OBJECTIVE:   DIAGNOSTIC FINDINGS: MRA but no results yet  COGNITION: Overall cognitive status: Within functional limits for tasks assessed   SENSATION: States some numbness in left hand   POSTURE:  rounded shoulders and forward head  Cervical ROM:  discomfort with all cervical movements "hurts my vertebrae 3 to 4 vertebra down"  Active AROM (deg) eval  Flexion 40*  Extension 30*  Right lateral flexion 20*  Left lateral flexion 15*  Right rotation 42*  Left rotation 42*  Shoulder flexion/scaption left 101  (Blank rows = not tested)  STRENGTH: 4/5 shoulder flexion * pain in my spine in my vertebrae    BED MOBILITY:  Not tested  TRANSFERS: Assistive device utilized: None  Sit to stand: Modified independence Stand to sit: Modified independence Chair to chair:  not tested Floor:  not tested  RAMP:  not tested  CURB:  not tested  GAIT: Gait pattern:  decreased Distance walked: 50 ft in clinic Assistive device utilized:  None Level of assistance: Modified independence Comments: decreased overall gait speed  FUNCTIONAL TESTS:  5 times sit to stand: not tested 11/21/22 23.44 sec using hands to assist up to stand  PATIENT SURVEYS:  DHI 48  VESTIBULAR ASSESSMENT:  GENERAL OBSERVATION: glasses (reading)   SYMPTOM BEHAVIOR:  Subjective history: see above  Non-Vestibular symptoms: neck pain, headaches, tinnitus, and nausea/vomiting  Type of dizziness: Imbalance (Disequilibrium), Spinning/Vertigo, and Lightheadedness/Faint  Frequency: varies  Duration: 5 min to 5 hours  Aggravating factors: Moving eyes and moving vehicle  Relieving factors: head stationary, medication, closing eyes, and rest  Progression of symptoms: better  OCULOMOTOR EXAM:  Ocular Alignment: normal  Ocular ROM: No Limitations but reports some dizziness with following pen tip  Spontaneous Nystagmus: absent  Gaze-Induced Nystagmus: absent  Smooth Pursuits: intact  Saccades: intact, extra eye movements, and reports eye pain  Convergence/Divergence: 9 cm states she is having double vision  VESTIBULAR - OCULAR REFLEX:   Slow VOR: Comment: not tested  VOR Cancellation: Comment: not tested  Head-Impulse Test: not tested  Dynamic Visual Acuity:  not tested   POSITIONAL TESTING: Other: not tested at eval  MOTION SENSITIVITY:  Motion Sensitivity Quotient Intensity: 0 = none, 1 = Lightheaded, 2 = Mild, 3 = Moderate, 4 = Severe, 5 = Vomiting  Intensity 11/10/22 11/14/22  1. Sitting to supine     2. Supine to L side  3 3  3. Supine to R side  3 3  4. Supine to sitting     5. L Hallpike-Dix  2   6. Up from L      7. R Hallpike-Dix  2   8. Up from R      9. Sitting, head tipped to L knee 2    10. Head up from L knee 2    11. Sitting, head tipped to R knee 2    12. Head up from R knee 2    13. Sitting head turns x5 3    14.Sitting head nods x5 2    15. In stance, 180 turn to L      16. In stance, 180 turn to R       OTHOSTATICS:  not done     VESTIBULAR TREATMENT:                                                                                                   DATE:  11/21/22 Sitting  Cervical rotation x 10 each Cervical sidebending x 10 each Sit to stand 23.44 sec using hands to assist up Horizontal saccades in sitting target x 1 Horizontal saccades in sitting target x 2 STM to cervical spine x 9' to decrease pain and increase soft tissue extensibility; no other intervention performed during manual Standing: Tandem stance with head turns x 5 each way with CGA for safety Small base of support with head turns x 10   11/14/22 Horizontal canal tested (-) each side. Cervical rotation x 10 Cervical side bend x 10  Supine to Right Sidelying Vestibular Habituation   10 reps - 5" hold - Supine to Left Sidelying Vestibular Habituation   10 reps - 5" hold - Brandt-Daroff Vestibular Exercise  - 5 reps x 2  Manual to cervical and upper thoracic with grade II mobs to decrease spasm and increase ROM Tandem stance with head turns Rt x 5; Lt x 5   11/10/22 Single leg stance: Rt 28", Lt 24"  Sitting: Cervical ROM x 10 each Scapular retraction x 10 Nose to RT knee x 5 Nose to lt knee x 5  Manual to cervical and upper thoracic with grade II mobs to decrease spasm and increase ROM  RT and Lt Dix-hallpike  both negative  Side lying test positive with upbeatting B, indicative of horizontal canal, however there was not enough time to complete the BBQ roll.   10/17/22 physical therapy evaluation and HEP instruction  Canalith Repositioning:  Comment: did not test for BPPV today Gaze Adaptation:   Habituation:  Other:   PATIENT EDUCATION: Education details: Patient educated on exam findings, POC, scope of PT, HEP, and what to expect next visit. Person educated: Patient Education method: Explanation, Demonstration, and Handouts Education comprehension: verbalized understanding, returned demonstration, verbal cues  required, and tactile cues required  HOME EXERCISE PROGRAM:Access Code: [email or web address] URL: [email or web address] 11/14/2022 - Supine to Right Sidelying Vestibular Habituation  - 4 x daily - 7 x weekly - 1 sets - 10 reps - 5" hold - Supine to Left Sidelying Vestibular Habituation  - 4 x daily - 7 x weekly - 1 sets - 10 reps - 5" hold - Brandt-Daroff Vestibular Exercise  - 4 x daily - 7 x weekly - 1 sets - 3-5 reps - 2 hold Date: 11/10/2022 Prepared by: Virgina Organ  Exercises - Seated Scapular Retraction  - 3 x daily - 7 x weekly - 1 sets - 10 reps - 5" hold - Seated Nose to Left Knee Vestibular Habituation  - 3 x daily - 7 x weekly - 1 sets - 10 reps - 1" hold - Seated Nose to Right Knee Vestibular Habituation  - 3 x daily - 7 x weekly - 1 sets - 10 reps - 1" hold Exercises- evaluation 10/17/22 - Seated Cervical Rotation AROM  - 2 x daily - 7 x weekly - 1 sets - 10 reps - Seated Cervical Flexion AROM  - 2 x daily - 7 x weekly - 1 sets - 10 reps - Seated Cervical Extension AROM  - 2 x daily - 7 x weekly - 1 sets - 10 reps GOALS: Goals reviewed with patient? No  SHORT TERM GOALS: Target date: 11/07/2022  patient will be independent with initial HEP  Baseline: Goal status: IN PROGRESS  2.  Patient will self report 30% improvement to improve tolerance for functional activity  Baseline:  Goal status: IN PROGRESS  LONG TERM GOALS: Target date: 11/28/2022  Patient will be independent in self management strategies to improve quality of life and functional outcomes.  Baseline:  Goal status: IN PROGRESS  2.  Patient will self report 50% improvement to improve tolerance for functional activity  Baseline:  Goal status: IN PROGRESS  3.  Patient will improve/decrease her score on the DHI by 13 (45) points to demonstate decreased handicap due to dizziness Baseline: 48 Goal status: IN PROGRESS  4.  Patient will improve cervical mobility by 30 degrees total to improve ability  to scan for safety with driving Baseline:  Goal status: IN PROGRESS  ASSESSMENT:  CLINICAL IMPRESSION:Today's session continued to work on habituation exercises and balance.  Reports some tightness and pulling with cervical mobility but minimal dizziness.   PT is still having dizziness and will benefit from skilled PT services.    Patient is a 62 y.o. female who was seen today for physical therapy evaluation and treatment for R42 (ICD-10-CM) - Dizziness and giddiness.  Patient demonstrates increased vestibular symptoms with provocative testing which is negatively impacting patient ability to perform ADLs and functional mobility tasks. Question possible cervical involvement.  Patient will benefit from skilled physical therapy services to address these deficits to improve level of function with ADLs, functional mobility tasks, and reduce risk for falls.    OBJECTIVE IMPAIRMENTS: decreased activity tolerance, decreased knowledge of condition, decreased mobility, decreased ROM, dizziness, increased fascial restrictions, impaired perceived functional ability, and pain.   ACTIVITY LIMITATIONS: bending, transfers, bed mobility, reach over head, and caring for others  PARTICIPATION LIMITATIONS: driving, shopping, and community activity  REHAB POTENTIAL: Good  CLINICAL DECISION MAKING: Evolving/moderate complexity  EVALUATION COMPLEXITY: Moderate   PLAN:  PT FREQUENCY: 1x/week  PT DURATION: 6 weeks  PLANNED INTERVENTIONS: Therapeutic exercises, Therapeutic activity, Neuromuscular re-education, Balance training, Gait training, Patient/Family education, Joint manipulation, Joint mobilization, Stair training, Orthotic/Fit training, DME instructions, Aquatic Therapy, Dry Needling, Electrical stimulation, Spinal manipulation, Spinal mobilization, Cryotherapy, Moist heat, Compression bandaging, scar mobilization, Splintting, Taping, Traction, Ultrasound, Ionotophoresis 4mg /ml Dexamethasone, and  Manual therapy   PLAN FOR NEXT SESSION: give balance exercise for HEP, continue with habituation as needed. Reassess next visit  2:31 PM, 11/21/22 Hilary Pundt Small Dimarco Minkin MPT [facility] [place] [phone]

## 2022-11-28 ENCOUNTER — Ambulatory Visit (HOSPITAL_COMMUNITY): Payer: Medicaid Other

## 2022-11-28 DIAGNOSIS — R42 Dizziness and giddiness: Secondary | ICD-10-CM | POA: Diagnosis not present

## 2022-11-28 NOTE — Therapy (Signed)
OUTPATIENT PHYSICAL THERAPY VESTIBULAR DISCHARGE PHYSICAL THERAPY DISCHARGE SUMMARY  Visits from Start of Care: 5  Current functional level related to goals / functional outcomes: See below   Remaining deficits: See below   Education / Equipment: See below  Patient agrees to discharge. Patient goals were met. Patient is being discharged due to meeting the stated rehab goals.      Patient Name: Lisa [name] MRN: [id number] DOB:21-Oct-1960, 62 y.o., female Today's Date: 11/28/2022  END OF SESSION:  PT End of Session - 11/28/22 1346     Visit Number 5    Number of Visits 6    Date for PT Re-Evaluation 11/28/22    Authorization Type Medicaid [facility]  please check auth- per auya not auth needed until 12/12/22    PT Start Time 1347    PT Stop Time 1425    PT Time Calculation (min) 38 min    Activity Tolerance Patient tolerated treatment well    Behavior During Therapy [facility] for tasks assessed/performed              Past Medical History:  Diagnosis Date   Allergy    Anemia    Arthritis    Asthma    Bursitis    Chronic back pain    Patient reports lower back disc problems   Degenerative lumbar disc    Diverticulitis    Enlarged uterus 03/18/2014   Essential hypertension, benign    GERD (gastroesophageal reflux disease)    High blood pressure    High cholesterol    Mixed hyperlipidemia    Mixed stress and urge urinary incontinence 03/18/2014   Myocardial infarction [facility])    Patient reports related to "steroids" in 1985 ([place])   PMB (postmenopausal bleeding) 03/18/2014   Seizures (HCC)    Stroke (HCC)    Tendinitis    Patient reports chronic problem involving her arms and hands   Past Surgical History:  Procedure Laterality Date   adnoids     CESAREAN SECTION     COLONOSCOPY N/A 06/04/2014   SLF: 1. moderate diverticulosis in teh sigmoid colon, descending colon, and transverse colon. 2. Two colon polyps removed 3. Large external and small internal  hemorroids.    TONSILLECTOMY     Patient Active Problem List   Diagnosis Date Noted   Left lower quadrant pain    Nausea without vomiting    Acute diverticulitis 08/10/2016   Obesity 12/21/2015   Chest pain 10/19/2015   Diverticulitis 07/13/2015   GERD (gastroesophageal reflux disease) 06/30/2015   IBS (irritable bowel syndrome) 06/30/2015   Internal and external hemorrhoids without complication 06/30/2015   Chronic ischemic heart disease 01/07/2015   Prediabetes 01/07/2015   Healthcare maintenance 01/07/2015   Diverticulitis large intestine 08/25/2014   Change in bowel habits    Chronic bilateral lower abdominal pain 05/13/2014   PMB (postmenopausal bleeding) 03/18/2014   Enlarged uterus 03/18/2014   Mixed stress and urge urinary incontinence 03/18/2014   Asthma, extrinsic 09/15/2013   Dyspnea 04/18/2013   Abnormal cardiovascular function study 01/21/2013   Precordial pain 12/10/2012   Essential hypertension, benign 12/10/2012   Mixed hyperlipidemia 12/10/2012    PCP: Corrington, Kip A, MD REFERRING PROVIDER: Corrington, Kip A, MD  REFERRING DIAG: R42 (ICD-10-CM) - Dizziness and giddiness  THERAPY DIAG:  Dizziness and giddiness  ONSET DATE: Jan 2003  Rationale for Evaluation and Treatment: Rehabilitation  SUBJECTIVE:   Could not use the metronome speed for head turns.  Otherwise dizziness is "  50%" better.     Evaluation SUBJECTIVE STATEMENT: Patient complains of dizziness, reports the sensation of the room spinning when she has an incident. Sometimes so severe that it impedes her mobility, sometimes she uses assistance of a walker; also has some low back issue from horseback riding accidents that she has to use walker for.  Has trouble reaching down to pick something off the floor; or if she has to do heavy work or walking.  She also experiences intermittent tinnitus and headaches described as sharp pain across her forehead.MD gave her meclizine and this seems to be  helping and referred to physical therapy .  Had MRA but no results yet; last incident with getting dizzy was about 2 weeks; sometimes it can happen just sitting or driving or walking; seems to have no rhyme or reason; varies in intensity and length.   Patient feels it is possibly coming from her neck; Jan 2003 had a fall in a stone shower and had seizures; double vision issues with neck and upper back; out of work 6 months after that. Rates her dizziness 3/10 right now; carry an emesis bag in car; reports childhood history of car sickness/motion sickness; increased dizziness if she reads too much Pt accompanied by: self  PERTINENT HISTORY: DDD lumbar; seizures; fentyl poisoning 2018; stroke 1994 could not use right side afterwards for a while  PAIN:  Are you having pain? Yes: NPRS scale: 4/10 Pain location: Left shoulder Pain description: irritating Aggravating factors: reaching up  Relieving factors: heat  PRECAUTIONS: Fall  WEIGHT BEARING RESTRICTIONS: No  FALLS: Has patient fallen in last 6 months? No; several near falls but catches herself on furniture  PLOF: Independent  PATIENT GOALS: being able to do things I enjoy doing; do my work on [employer] without headaches or vertigo  OBJECTIVE:   DIAGNOSTIC FINDINGS: MRA but no results yet  COGNITION: Overall cognitive status: Within functional limits for tasks assessed   SENSATION: States some numbness in left hand   POSTURE:  rounded shoulders and forward head  Cervical ROM:  discomfort with all cervical movements "hurts my vertebrae 3 to 4 vertebra down"  Active AROM (deg) eval AROM 11/28/22  Flexion 40* full  Extension 30* 53  Right lateral flexion 20* 31  Left lateral flexion 15* 30  Right rotation 42* 48  Left rotation 42* 55  Shoulder flexion/scaption left 101 120  (Blank rows = not tested)  STRENGTH: 4/5 shoulder flexion * pain in my spine in my vertebrae    BED MOBILITY:  Not  tested  TRANSFERS: Assistive device utilized: None  Sit to stand: Modified independence Stand to sit: Modified independence Chair to chair:  not tested Floor:  not tested  RAMP:  not tested  CURB:  not tested  GAIT: Gait pattern:  decreased Distance walked: 50 ft in clinic Assistive device utilized: None Level of assistance: Modified independence Comments: decreased overall gait speed  FUNCTIONAL TESTS:  5 times sit to stand: not tested 11/21/22 23.44 sec using hands to assist up to stand  PATIENT SURVEYS:  DHI 48  VESTIBULAR ASSESSMENT:  GENERAL OBSERVATION: glasses (reading)   SYMPTOM BEHAVIOR:  Subjective history: see above  Non-Vestibular symptoms: neck pain, headaches, tinnitus, and nausea/vomiting  Type of dizziness: Imbalance (Disequilibrium), Spinning/Vertigo, and Lightheadedness/Faint  Frequency: varies  Duration: 5 min to 5 hours  Aggravating factors: Moving eyes and moving vehicle  Relieving factors: head stationary, medication, closing eyes, and rest  Progression of symptoms: better  OCULOMOTOR EXAM:  Ocular Alignment: normal  Ocular ROM: No Limitations but reports some dizziness with following pen tip  Spontaneous Nystagmus: absent  Gaze-Induced Nystagmus: absent  Smooth Pursuits: intact  Saccades: intact, extra eye movements, and reports eye pain  Convergence/Divergence: 9 cm states she is having double vision  VESTIBULAR - OCULAR REFLEX:   Slow VOR: Comment: not tested  VOR Cancellation: Comment: not tested  Head-Impulse Test: not tested  Dynamic Visual Acuity:  not tested   POSITIONAL TESTING: Other: not tested at eval  MOTION SENSITIVITY:  Motion Sensitivity Quotient Intensity: 0 = none, 1 = Lightheaded, 2 = Mild, 3 = Moderate, 4 = Severe, 5 = Vomiting  Intensity 11/10/22 11/14/22  1. Sitting to supine     2. Supine to L side  3 3  3. Supine to R side  3 3  4. Supine to sitting     5. L Hallpike-Dix  2   6. Up from L      7. R  Hallpike-Dix  2   8. Up from R      9. Sitting, head tipped to L knee 2    10. Head up from L knee 2    11. Sitting, head tipped to R knee 2    12. Head up from R knee 2    13. Sitting head turns x5 3    14.Sitting head nods x5 2    15. In stance, 180 turn to L      16. In stance, 180 turn to R       OTHOSTATICS: not done     VESTIBULAR TREATMENT:                                                                                                   DATE:  11/28/22 Progress note DHI 30 Cervical AROM see above  STM to cervical spine x 10' to decrease cervical paraspinal tightness  11/21/22 Sitting  Cervical rotation x 10 each Cervical sidebending x 10 each Sit to stand 23.44 sec using hands to assist up Horizontal saccades in sitting target x 1 Horizontal saccades in sitting target x 2 STM to cervical spine x 9' to decrease pain and increase soft tissue extensibility; no other intervention performed during manual Standing: Tandem stance with head turns x 5 each way with CGA for safety Small base of support with head turns x 10   11/14/22 Horizontal canal tested (-) each side. Cervical rotation x 10 Cervical side bend x 10  Supine to Right Sidelying Vestibular Habituation   10 reps - 5" hold - Supine to Left Sidelying Vestibular Habituation   10 reps - 5" hold - Brandt-Daroff Vestibular Exercise  - 5 reps x 2  Manual to cervical and upper thoracic with grade II mobs to decrease spasm and increase ROM Tandem stance with head turns Rt x 5; Lt x 5   11/10/22 Single leg stance: Rt 28", Lt 24"  Sitting: Cervical ROM x 10 each Scapular retraction x 10 Nose to RT knee x 5 Nose to lt knee x 5  Manual to  cervical and upper thoracic with grade II mobs to decrease spasm and increase ROM  RT and Lt Dix-hallpike  both negative  Side lying test positive with upbeatting B, indicative of horizontal canal, however there was not enough time to complete the BBQ roll.   10/17/22 physical  therapy evaluation and HEP instruction  Canalith Repositioning:  Comment: did not test for BPPV today Gaze Adaptation:   Habituation:   Other:   PATIENT EDUCATION: Education details: Patient educated on exam findings, POC, scope of PT, HEP, and what to expect next visit. Person educated: Patient Education method: Explanation, Demonstration, and Handouts Education comprehension: verbalized understanding, returned demonstration, verbal cues required, and tactile cues required  HOME EXERCISE PROGRAM:Access Code: [email or web address] URL: [email or web address] 11/14/2022 - Supine to Right Sidelying Vestibular Habituation  - 4 x daily - 7 x weekly - 1 sets - 10 reps - 5" hold - Supine to Left Sidelying Vestibular Habituation  - 4 x daily - 7 x weekly - 1 sets - 10 reps - 5" hold - Brandt-Daroff Vestibular Exercise  - 4 x daily - 7 x weekly - 1 sets - 3-5 reps - 2 hold Date: 11/10/2022 Prepared by: Virgina Organ  Exercises - Seated Scapular Retraction  - 3 x daily - 7 x weekly - 1 sets - 10 reps - 5" hold - Seated Nose to Left Knee Vestibular Habituation  - 3 x daily - 7 x weekly - 1 sets - 10 reps - 1" hold - Seated Nose to Right Knee Vestibular Habituation  - 3 x daily - 7 x weekly - 1 sets - 10 reps - 1" hold Exercises- evaluation 10/17/22 - Seated Cervical Rotation AROM  - 2 x daily - 7 x weekly - 1 sets - 10 reps - Seated Cervical Flexion AROM  - 2 x daily - 7 x weekly - 1 sets - 10 reps - Seated Cervical Extension AROM  - 2 x daily - 7 x weekly - 1 sets - 10 reps GOALS: Goals reviewed with patient? No  SHORT TERM GOALS: Target date: 11/07/2022  patient will be independent with initial HEP  Baseline: Goal status: met  2.  Patient will self report 30% improvement to improve tolerance for functional activity  Baseline:  Goal status: met  LONG TERM GOALS: Target date: 11/28/2022  Patient will be independent in self management strategies to improve quality of life and  functional outcomes.  Baseline:  Goal status: met  2.  Patient will self report 50% improvement to improve tolerance for functional activity  Baseline:  Goal status: met  3.  Patient will improve/decrease her score on the DHI by 13 (35) points to demonstate decreased handicap due to dizziness Baseline: 48; 30 11/28/22 Goal status: met  4.  Patient will improve cervical mobility by 30 degrees total to improve ability to scan for safety with driving Baseline:  Goal status: met  ASSESSMENT:  CLINICAL IMPRESSION:progress note today.    Patient is a 62 y.o. female who was seen today for physical therapy evaluation and treatment for R42 (ICD-10-CM) - Dizziness and giddiness.  Patient demonstrates increased vestibular symptoms with provocative testing which is negatively impacting patient ability to perform ADLs and functional mobility tasks. Question possible cervical involvement.  Patient will benefit from skilled physical therapy services to address these deficits to improve level of function with ADLs, functional mobility tasks, and reduce risk for falls.    OBJECTIVE IMPAIRMENTS: decreased activity tolerance, decreased knowledge of condition, decreased mobility, decreased  ROM, dizziness, increased fascial restrictions, impaired perceived functional ability, and pain.   ACTIVITY LIMITATIONS: bending, transfers, bed mobility, reach over head, and caring for others  PARTICIPATION LIMITATIONS: driving, shopping, and community activity  REHAB POTENTIAL: Good  CLINICAL DECISION MAKING: Evolving/moderate complexity  EVALUATION COMPLEXITY: Moderate   PLAN:  PT FREQUENCY: 1x/week  PT DURATION: 6 weeks  PLANNED INTERVENTIONS: Therapeutic exercises, Therapeutic activity, Neuromuscular re-education, Balance training, Gait training, Patient/Family education, Joint manipulation, Joint mobilization, Stair training, Orthotic/Fit training, DME instructions, Aquatic Therapy, Dry Needling,  Electrical stimulation, Spinal manipulation, Spinal mobilization, Cryotherapy, Moist heat, Compression bandaging, scar mobilization, Splintting, Taping, Traction, Ultrasound, Ionotophoresis 4mg /ml Dexamethasone, and Manual therapy   PLAN FOR NEXT SESSION: discharge  2:17 PM, 11/28/22 Missie Gehrig Small [place] MPT [facility] [place] [phone] Ph:[phone]

## 2022-12-05 ENCOUNTER — Encounter (HOSPITAL_COMMUNITY): Payer: Medicaid Other

## 2022-12-12 ENCOUNTER — Encounter (HOSPITAL_COMMUNITY): Payer: Medicaid Other
